# Patient Record
Sex: Male | Born: 1950
Health system: Southern US, Community
[De-identification: ages and names within clinical notes are randomized; demographics above are authoritative.]

## PROBLEM LIST (undated history)

## (undated) DIAGNOSIS — E785 Hyperlipidemia, unspecified: Secondary | ICD-10-CM

## (undated) DIAGNOSIS — G5603 Carpal tunnel syndrome, bilateral upper limbs: Secondary | ICD-10-CM

## (undated) DIAGNOSIS — E559 Vitamin D deficiency, unspecified: Secondary | ICD-10-CM

## (undated) DIAGNOSIS — I259 Chronic ischemic heart disease, unspecified: Secondary | ICD-10-CM

## (undated) DIAGNOSIS — G40909 Epilepsy, unspecified, not intractable, without status epilepticus: Secondary | ICD-10-CM

## (undated) DIAGNOSIS — M722 Plantar fascial fibromatosis: Secondary | ICD-10-CM

## (undated) HISTORY — PX: HAND SURGERY: SHX662

## (undated) HISTORY — DX: Hyperlipidemia, unspecified: E78.5

## (undated) HISTORY — DX: Carpal tunnel syndrome, bilateral upper limbs: G56.03

## (undated) HISTORY — DX: Chronic ischemic heart disease, unspecified: I25.9

## (undated) HISTORY — DX: Epilepsy, unspecified, not intractable, without status epilepticus: G40.909

## (undated) HISTORY — DX: Plantar fascial fibromatosis: M72.2

## (undated) HISTORY — PX: CHOLECYSTECTOMY: SHX55

## (undated) HISTORY — DX: Vitamin D deficiency, unspecified: E55.9

---

## 2003-06-29 ENCOUNTER — Observation Stay (HOSPITAL_COMMUNITY): Admission: EM | Admit: 2003-06-29 | Discharge: 2003-06-30 | Payer: Self-pay | Admitting: Emergency Medicine

## 2003-06-30 ENCOUNTER — Encounter (INDEPENDENT_AMBULATORY_CARE_PROVIDER_SITE_OTHER): Payer: Self-pay | Admitting: Specialist

## 2005-08-23 ENCOUNTER — Ambulatory Visit: Payer: Self-pay | Admitting: Gastroenterology

## 2005-09-04 ENCOUNTER — Ambulatory Visit: Payer: Self-pay | Admitting: Gastroenterology

## 2006-08-21 HISTORY — PX: CORONARY ARTERY BYPASS GRAFT: SHX141

## 2006-08-29 ENCOUNTER — Inpatient Hospital Stay (HOSPITAL_BASED_OUTPATIENT_CLINIC_OR_DEPARTMENT_OTHER): Admission: RE | Admit: 2006-08-29 | Discharge: 2006-08-29 | Payer: Self-pay | Admitting: Cardiology

## 2006-08-29 ENCOUNTER — Inpatient Hospital Stay (HOSPITAL_COMMUNITY): Admission: AD | Admit: 2006-08-29 | Discharge: 2006-09-02 | Payer: Self-pay | Admitting: Cardiology

## 2006-08-29 ENCOUNTER — Ambulatory Visit: Payer: Self-pay | Admitting: Thoracic Surgery (Cardiothoracic Vascular Surgery)

## 2006-09-24 ENCOUNTER — Ambulatory Visit: Payer: Self-pay | Admitting: Thoracic Surgery (Cardiothoracic Vascular Surgery)

## 2006-10-01 ENCOUNTER — Encounter (HOSPITAL_COMMUNITY): Admission: RE | Admit: 2006-10-01 | Discharge: 2006-11-26 | Payer: Self-pay | Admitting: Cardiology

## 2008-10-24 IMAGING — CR DG CHEST 2V
2 series · 2 of 2 positions shown · non-contrast
Comparison: none

CLINICAL DATA: Coronary artery disease.  Preoperative respiratory exam for coronary artery bypass grafting. 
 CHEST - 2 VIEW:

[w chest pa]
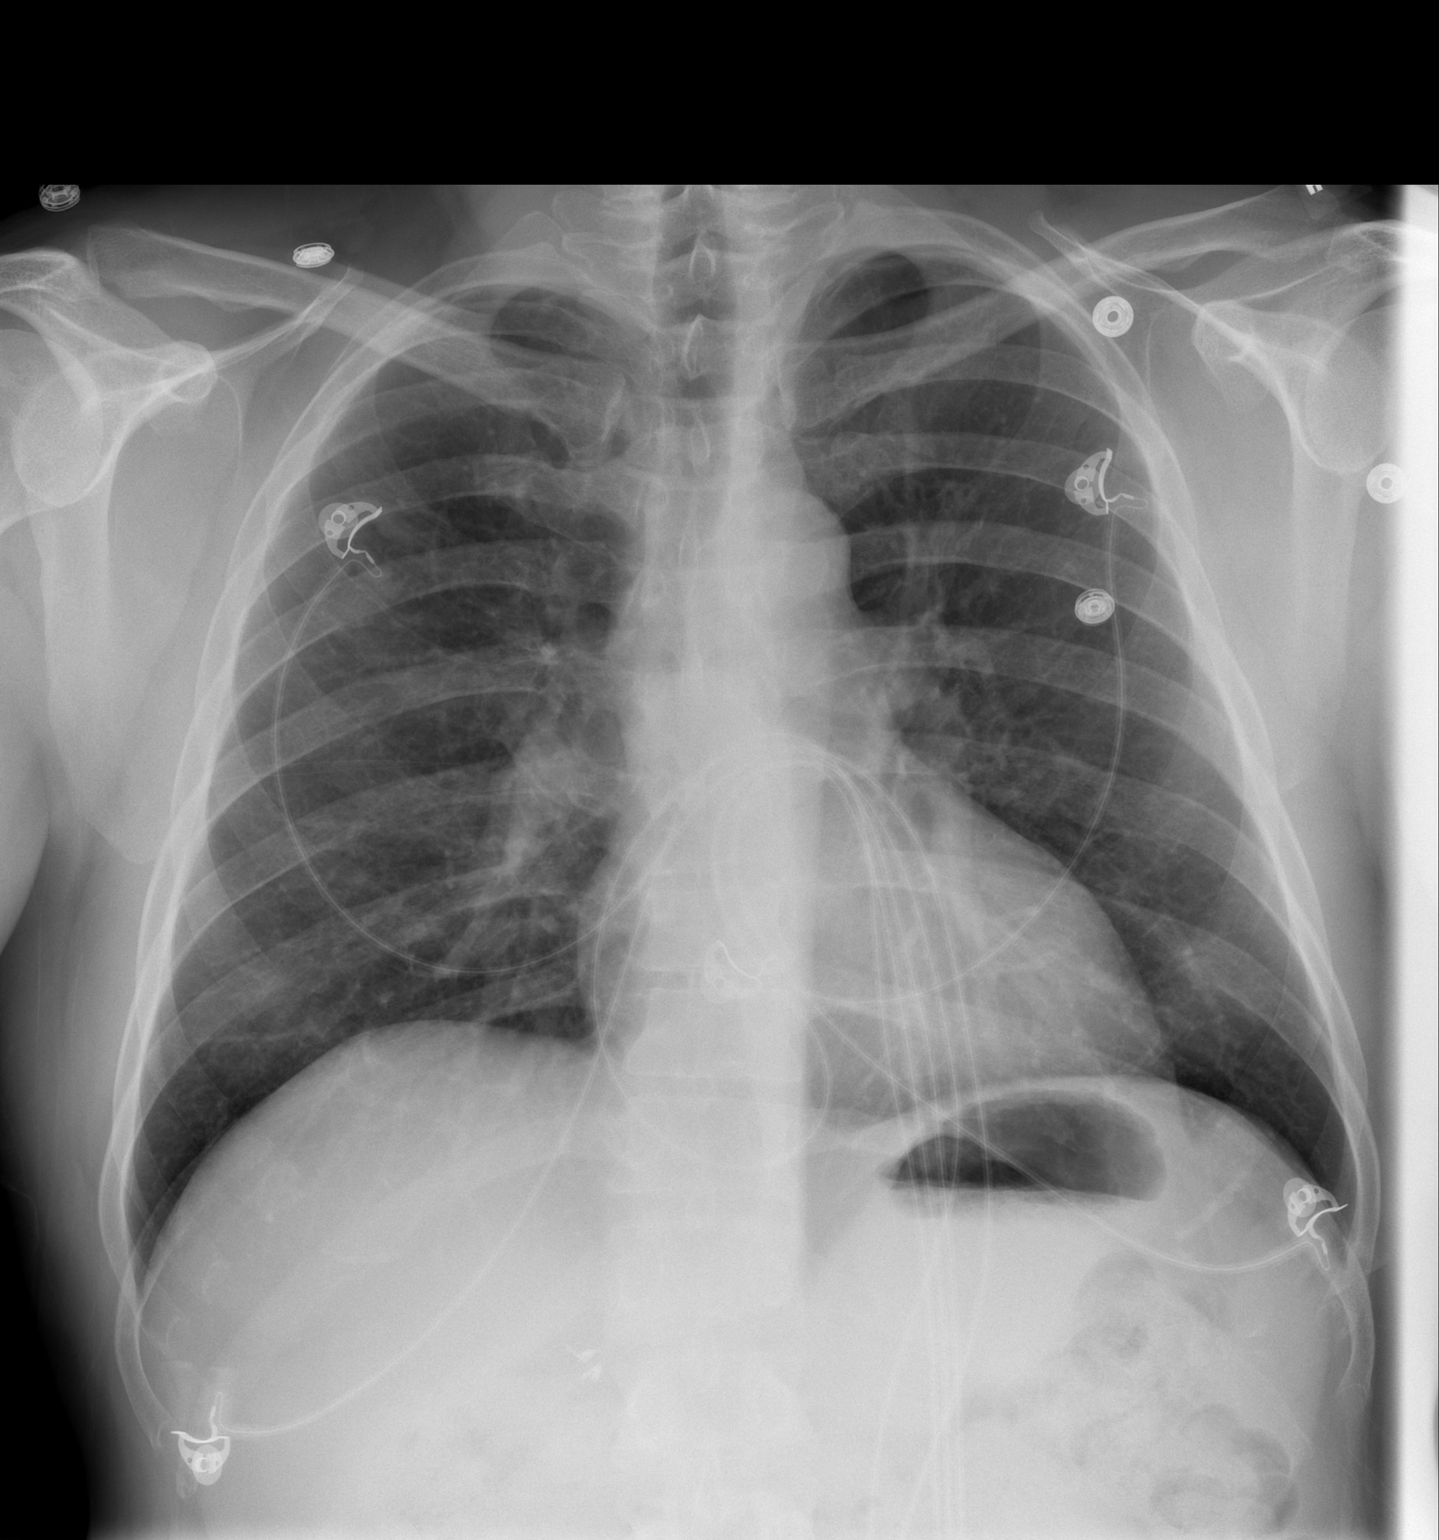

[w chest lat]
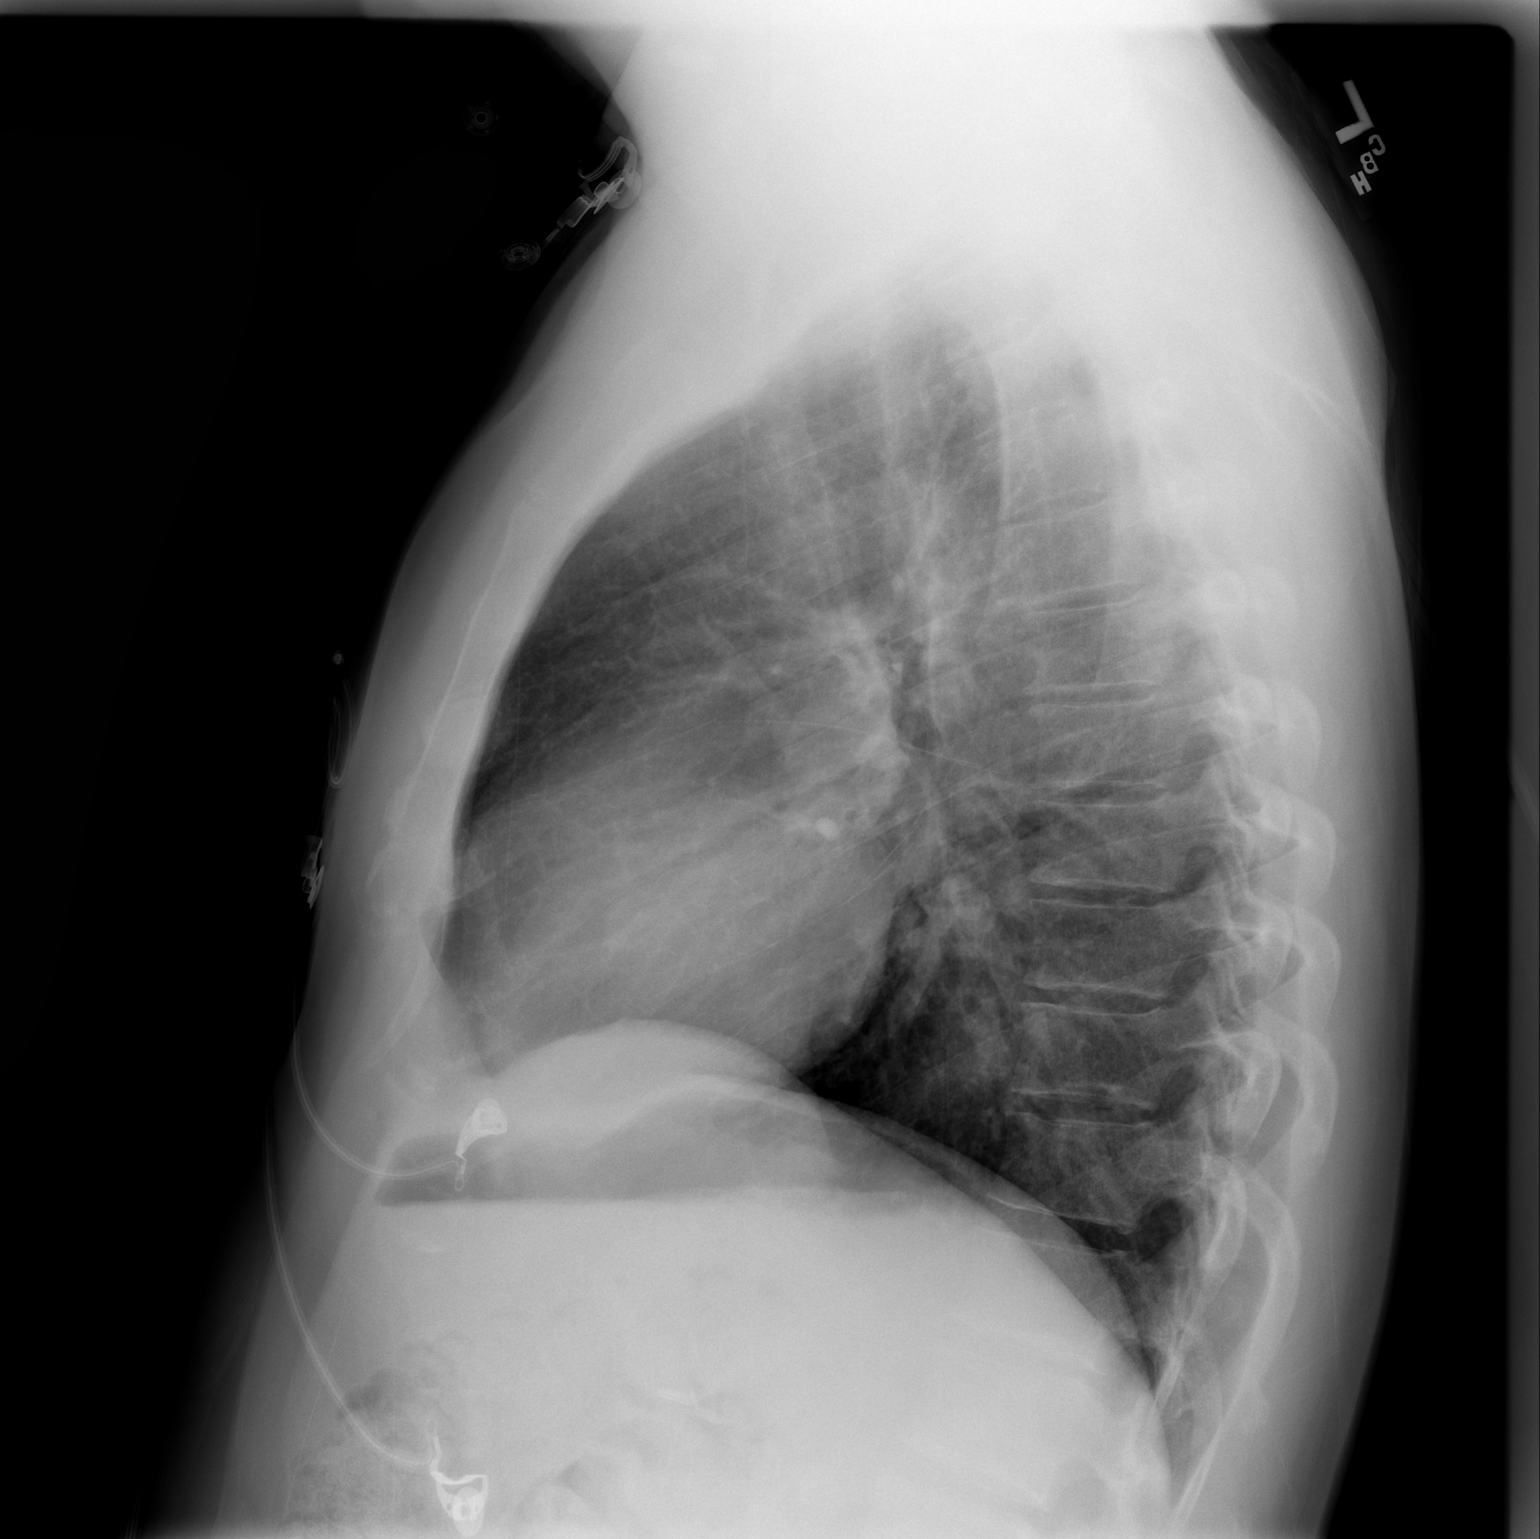

[2 of 2 positions shown; findings below may reference images not displayed]

FINDINGS: The heart size and mediastinal contours are within normal limits.  Both lungs are clear.  The visualized skeletal structures are unremarkable.
IMPRESSION: No active cardiopulmonary disease.

## 2010-04-12 ENCOUNTER — Ambulatory Visit (HOSPITAL_BASED_OUTPATIENT_CLINIC_OR_DEPARTMENT_OTHER): Admission: RE | Admit: 2010-04-12 | Discharge: 2010-04-12 | Payer: Self-pay | Admitting: Orthopedic Surgery

## 2010-08-02 LAB — BASIC METABOLIC PANEL
BUN: 11 mg/dL (ref 6–23)
Creatinine, Ser: 0.96 mg/dL (ref 0.4–1.5)
GFR calc non Af Amer: >60 mL/min (ref >60)
Glucose, Bld: 94 mg/dL (ref 70–99)

## 2010-08-02 LAB — POCT HEMOGLOBIN-HEMACUE: Hemoglobin: 15.3 g/dL (ref 13.0–17.0)

## 2010-08-31 ENCOUNTER — Ambulatory Visit (INDEPENDENT_AMBULATORY_CARE_PROVIDER_SITE_OTHER): Payer: Commercial Managed Care - PPO | Admitting: Cardiology

## 2010-08-31 ENCOUNTER — Encounter: Payer: Self-pay | Admitting: Cardiology

## 2010-08-31 DIAGNOSIS — E785 Hyperlipidemia, unspecified: Secondary | ICD-10-CM | POA: Insufficient documentation

## 2010-08-31 DIAGNOSIS — I10 Essential (primary) hypertension: Secondary | ICD-10-CM | POA: Insufficient documentation

## 2010-08-31 DIAGNOSIS — G40909 Epilepsy, unspecified, not intractable, without status epilepticus: Secondary | ICD-10-CM | POA: Insufficient documentation

## 2010-08-31 DIAGNOSIS — Z951 Presence of aortocoronary bypass graft: Secondary | ICD-10-CM

## 2010-08-31 DIAGNOSIS — Z9889 Other specified postprocedural states: Secondary | ICD-10-CM

## 2010-08-31 NOTE — Assessment & Plan Note (Signed)
followup per Dr. Oneta Rack

## 2010-08-31 NOTE — Progress Notes (Signed)
Subjective:   [name] is seen today for followup visit. He an ostial LAD lesion and had a LIMA graft to his LAD in April of 2008. He had off-pump coronary artery bypass grafting done by Dr Cornelius Moras on August 30, 2006. He has done well since that time.  He has a history of lipid disorder. He tolerated Crestor well. He is followed by Dr. Oneta Rack  Current Outpatient Prescriptions  Medication Sig Dispense Refill  . aspirin 81 MG tablet Take 81 mg by mouth daily.        . CarBAMazepine (CARBATROL PO) Take 600 mg by mouth 2 (two) times daily.        . Cholecalciferol (VITAMIN D PO) Take 10,000 Units by mouth daily.        . fish oil-omega-3 fatty acids 1000 MG capsule Take by mouth daily.        . metoprolol succinate (TOPROL-XL) 25 MG 24 hr tablet Take 25 mg by mouth daily. 12.5 MG DAILY       . rosuvastatin (CRESTOR) 40 MG tablet Take 40 mg by mouth daily.        Marland Kitchen Specialty Vitamins Products (MAGNESIUM, AMINO ACID CHELATE,) 133 MG tablet Take 1 tablet by mouth daily.          No Known Allergies  Patient Active Problem List  Diagnoses  . Plantar fasciitis  . Seizure disorder  . Hyperlipidemia    History  Smoking status  . Never Smoker   Smokeless tobacco  . Not on file    History  Alcohol Use No    Family History  Problem Relation Age of Onset  . Hypertension Mother   . Stroke Mother   . Diabetes Father     Review of Systems:   The patient denies any heat or cold intolerance.  No weight gain or weight loss.  The patient denies headaches or blurry vision.  There is no cough or sputum production.  The patient denies dizziness.  There is no hematuria or hematochezia.  The patient denies any muscle aches or arthritis.  The patient denies any rash.  The patient denies frequent falling or instability.  There is no history of depression or anxiety.  All other systems were reviewed and are negative.   Physical Exam:   Weight is 195. Blood pressure is 150/90 sitting, heart rate 58The head  is normocephalic and atraumatic.  Pupils are equally round and reactive to light.  Sclerae nonicteric.  Conjunctiva is clear.  Oropharynx is unremarkable.  There's adequate oral airway.  Neck is supple there are no masses.  Thyroid is not enlarged.  There is no lymphadenopathy.  Lungs are clear.  Chest is symmetric.  Heart shows a regular rate and rhythm.  S1 and S2 are normal.  There is no murmur click or gallop.  Abdomen is soft normal bowel sounds.  There is no organomegaly.  Genital and rectal deferred.  Extremities are without edema.  Peripheral pulses are adequate.  Neurologically intact.  Full range of motion.  The patient is not depressed.  Skin is warm and dry. Assessment / Plan:

## 2010-08-31 NOTE — Assessment & Plan Note (Signed)
Blood pressure in general has been well-controlled I'll ask him to check blood pressure readings at home. If they were remain elevated, we'll need to consider the addition of an ACE inhibitor or defer management to Dr. Oneta Rack

## 2010-08-31 NOTE — Assessment & Plan Note (Signed)
He had a followup stress echo in 2010. We will defer other followup at this point in time. He clinically is doing well he

## 2010-10-07 NOTE — H&P (Signed)
NAMENOELL, SHULAR              ACCOUNT NO.:  [id number]   MEDICAL RECORD NO.:  [id number]           PATIENT TYPE:   LOCATION:                                 FACILITY:   PHYSICIAN:  Colleen Can. Deborah Chalk, M.D.DATE OF BIRTH:  [birth year]   DATE OF ADMISSION:  DATE OF DISCHARGE:                              HISTORY & PHYSICAL   CHIEF COMPLAINT:  Chest pain.   HISTORY OF PRESENT ILLNESS:  Mr. Sian is a pleasant 60 year old white  male who has a known history of hyperlipidemia.  He presents to the  office for evaluation of chest pain over the past several days.  It  started initially while he was moving some wood that had previously been  cut.  He has had discomfort off and on since that time.  It initially  started approximately 2 weeks ago.  It has been somewhat substernal in  nature.  It has been radiating to the back.  He has had some associated  nausea as well as anxiety.  He presents to the office and he is now  referred for cardiac catheterization.   PAST MEDICAL HISTORY:  1. Hyperlipidemia.  He has been on Crestor for a couple of years.  2. History of cholecystectomy.  3. Hand surgery x2.  4. History of a seizure disorder.  He is maintained on chronic      Tegretol.   ALLERGIES:  None.   CURRENT MEDICATIONS:  1. Tegretol 600 b.i.d.  2. Crestor 10 a day.  3. Aspirin daily.  4. Centrum Silver daily.  5. Vitamin D daily.   FAMILY HISTORY:  Both of his parents are alive.  Father is 65 and has  diabetes.  Mother is 43 and has had hypertension and a stroke.   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  He has no  alcohol or tobacco.  He lives at home with his wife.  He has two  children.   REVIEW OF SYSTEMS:  Is as noted above and is otherwise unremarkable.   PHYSICAL EXAMINATION:  GENERAL:  He is a pleasant, middle-aged white  male.  He is in no acute distress.  VITAL SIGNS:  Blood pressure is elevated slightly at 150/82 sitting,  140/86 standing.   Heart rate is 58.  Respirations are 18, he is  afebrile.  Weight is 200 pounds.  SKIN:  Warm and dry.  Color is unremarkable.  LUNGS:  Clear.  HEART:  Shows a regular rhythm.  ABDOMEN:  Soft.  EXTREMITIES:  Without edema.  NEUROLOGIC:  Shows no gross focal deficits.   His EKG shows sinus bradycardia with nonspecific ST- and T-wave changes.   Other labs are pending.   OVERALL IMPRESSION:  1. Chest pain.  2. Hyperlipidemia.  3. Elevated blood pressure.   PLAN:  Will proceed on with cardiac catheterization on August 29, 2006.  The procedure risks and benefits have all been explained and he is  willing to proceed.      Sharlee Blew, N.P.      Colleen Can. Deborah Chalk, M.D.  Electronically Signed  LC/MEDQ  D:  08/28/2006  T:  08/28/2006  Job:  [id number]

## 2010-10-07 NOTE — Consult Note (Signed)
[name], [name]              ACCOUNT NO.:  [id number]   MEDICAL RECORD NO.:  [id number]          PATIENT TYPE:  INP   LOCATION:  6529                         FACILITY:  [facility]   PHYSICIAN:  Salvatore Decent. Cornelius Moras, M.D. DATE OF BIRTH:  December 02, 1950   DATE OF CONSULTATION:  08/29/2006  DATE OF DISCHARGE:                                 CONSULTATION   REQUESTING PHYSICIAN:  Colleen Can. Deborah Chalk, M.D.   PRIMARY CARE PHYSICIAN:  Lucky Cowboy, M.D.   REASON FOR CONSULTATION:  Single-vessel coronary artery disease with  critical ostial left anterior descending coronary artery stenosis and  new onset unstable angina.   HISTORY OF PRESENT ILLNESS:  [name] is a 60 year old otherwise  healthy white male with no previous cardiac history and risk factors  notable only for history of hyperlipidemia.  The patient has been in his  usual state of good health until just over a week ago when he developed  new-onset substernal chest pain during strenuous physical activity.  Over the subsequent week she had several recurring episodes of  discomfort, all occurring in the setting of physical activity or stress.  Each of these episodes lasted between 10 and 20 minutes in duration and  subsided with rest.  The pain has not been associated with shortness of  breath.  He was referred to Dr. Deborah Chalk and subsequently scheduled for  elective cardiac catheterization today.  Catheterization demonstrates a  long segment 95% critical ostial stenosis of the left anterior  descending coronary artery beginning just after the bifurcation of the  left main.  There is otherwise no significant coronary artery disease  appreciated in the remainder of the three major vessels and left  ventricular function remains preserved.  Percutaneous coronary  intervention was felt to be risky due to anatomical concerns and cardiac  surgical consultation is then requested.   REVIEW OF SYSTEMS:  GENERAL:  The patient reports normal  appetite.  He  has not been gaining or losing weight recently.  His exercise tolerance  has been normal  .  CARDIAC:  Notable for new onset symptoms of unstable  angina.  The patient denies any rest angina or nocturnal angina.  He  denies any shortness of breath either with activity or at rest.  He  denies any PND, orthopnea, or longstanding edema.  He has not had any  tachy palpitations nor syncope.  RESPIRATORY:  Negative.  The patient  denies productive cough, hemoptysis, wheezing.  GASTROINTESTINAL:  Negative.  The patient has no difficulty swallowing.  He denies  hematochezia, hematemesis, melena.  MUSCULOSKELETAL:  Negative.  The  patient denies significant arthritis or arthralgias.  NEUROLOGIC:  Negative.  The patient has history of seizure disorder in the past but  has not had any seizure activity for more than 5 years.  GENITOURINARY:  Negative.  The patient denies urinary urgency or frequency.  HEMATOLOGIC:  Negative.  HEENT:  Negative.  PSYCHIATRIC:  Negative.   PAST MEDICAL HISTORY:  1. Hyperlipidemia.  2. Seizure disorder.   PAST SURGICAL HISTORY:  Cholecystectomy.   FAMILY HISTORY:  The patient's father suffered myocardial  infarction and  had an angioplasty in his 25s.  However, his father remains alive and  well.  His mother also had coronary artery disease and remains alive.   SOCIAL HISTORY:  The patient is married and lives with his wife here in  [place].  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  He denies any tobacco or alcohol consumption.   MEDICATIONS PRIOR TO ADMISSION:  1. Crestor 10 mg daily.  2. Carbatrol 600 mg twice daily.  3. Aspirin 81 mg daily.  4. Multivitamin 1 tablet daily.  5. Vitamin B 1 tablet daily.   DRUG ALLERGIES:  None known.   PHYSICAL EXAMINATION:  GENERAL:  The patient is well-appearing male who  appears his stated age, in no acute distress.  VITAL SIGNS:  He is mildly hypertensive with blood pressure  150/82.  He  is in sinus rhythm.  HEENT:  Within normal limits.  NECK:  Supple.  There is no cervical nor supraclavicular  lymphadenopathy.  There is no jugular venous distension.  No carotid  bruits noted.  CHEST:  Auscultation of chest notable for clear breath sounds which are  symmetrical bilaterally.  No wheezes or rhonchi noted.  CARDIOVASCULAR:  Regular rate and rhythm.  No murmurs, rubs or gallops noted.  ABDOMEN:  Soft, nontender.  There are no masses.  Bowel sounds are  present.  EXTREMITIES:  Warm and well perfused.  There is no lower extremity  edema.  Distal pulses are palpable in both lower legs at the ankle.  There is no sign of venous insufficiency.  There is no lower extremity  edema.  SKIN:  Clean, dry, healthy-appearing throughout.  RECTAL/GU:  Both deferred.  NEUROLOGIC:  Grossly nonfocal and symmetrical throughout.   DIAGNOSTIC TESTS:  Cardiac catheterization performed by Dr. Deborah Chalk is  reviewed.  This demonstrates long segment, 95% proximal stenosis of the  left anterior descending coronary artery involving the ostial portion of  this vessel.  There is otherwise no significant coronary artery disease  and right dominant coronary circulation.  Left ventricular function  appears normal.  No significant wall motion abnormalities.   IMPRESSION:  Severe single vessel coronary artery disease with long  segment ostial stenosis of left anterior descending coronary artery and  new onset symptoms of unstable angina with coronary anatomy unfavorable  for percutaneous coronary intervention.  Left ventricular function  remains normal.  There is hypertension and hyperlipidemia.  I believe  [name] would best be treated with surgical revascularization to  include off-pump coronary bypass grafting.   PLAN:  I discussed options at length with [name] and his family.  Alternative treatment strategies have been discussed.  He understands and accepts all associated risks  of surgery including but not limited to  risk of death, stroke, myocardial infarction, congestive heart failure,  respiratory failure, pneumonia, bleeding requiring blood transfusion,  arrhythmia, infection, and recurrent coronary artery disease.  All their  questions have been addressed.      Salvatore Decent. Cornelius Moras, M.D.  Electronically Signed     CHO/MEDQ  D:  08/29/2006  T:  08/30/2006  Job:  [id number]   cc:   Colleen Can. Deborah Chalk, M.D.  Lucky Cowboy, M.D.

## 2010-10-07 NOTE — Op Note (Signed)
NAME:  [name], [name]                        ACCOUNT NO.:  [id number]   MEDICAL RECORD NO.:  [id number]                   PATIENT TYPE:  INP   LOCATION:  0473                                 FACILITY:  [facility]   PHYSICIAN:  Ollen Gross. Vernell Morgans, M.D.              DATE OF BIRTH:  1950-09-26   DATE OF PROCEDURE:  06/29/2003  DATE OF DISCHARGE:  06/30/2003                                 OPERATIVE REPORT   PREOPERATIVE DIAGNOSIS:  Gallstones.   POSTOPERATIVE DIAGNOSIS:  Gallstones with gangrenous cholecystitis.   PROCEDURE:  Laparoscopic cholecystectomy with intraoperative cholangiogram.   SURGEON:  Ollen Gross. Carolynne Edouard, M.D.   ASSISTANT:  Leonie Man, M.D.   ANESTHESIA:  General endotracheal.   DESCRIPTION OF PROCEDURE:  After informed consent was obtained, the patient  was brought to the operating room and placed in the supine position on the  operating room table.  After adequate induction of general anesthesia, the  patient's abdomen was prepped with Betadine and draped in the usual sterile  manner.  The area below the umbilicus was infiltrated with 0.25% Marcaine.  A small incision was made with a 15 blade knife.  This incision was carried  down through the subcutaneous tissue bluntly with a Kelly clamp and [employer] retractors until the linea alba was identified.  The linea alba was  incised with the 15 blade knife, and each side was grasped with Kocher  clamps and elevated anteriorly.  The preperitoneal space was then probed  bluntly with a hemostat until the peritoneum was opened and access was  gained to the abdominal cavity.  A 0 Vicryl pursestring stitch was placed in  the fascia around the opening.  Hasson cannula was placed through the  opening and anchored in place with the previously-placed Vicryl pursestring  stitch.  Was placed in the head up position and rolled slightly with the  right side up.  Laparoscope was placed through the Hasson cannula, and the  right upper  quadrant was inspected.  The dome of the gallbladder and liver  were readily identified.  The gallbladder had a little bit of a gangrenous  appearance to the wall.  Next, the epigastric region was infiltrated with  0.25% Marcaine.  A small incision was made with the 15 blade knife, and the  10 mm port was placed bluntly through this incision into the abdominal  cavity under direct vision.  Sites were then chosen laterally on the right  side of the abdomen for placement of 5 mm ports.  Each of these areas was  infiltrated with 0.25% Marcaine, and small stab incisions were made with the  15 blade knife.  The 5 mm ports were placed bluntly through these incisions  into the abdominal cavity under direct vision.  A blunt grasper was placed  through the lateral-most 5 mm port and used to grasp the dome of the  gallbladder and elevate it  anteriorly and superiorly.  Another blunt grasper  was placed through the other 5 mm port and used to retract on the body and  neck of the gallbladder.  A dissector was placed through the epigastric port  and using the electrocautery, the peritoneal reflection at the gallbladder  neck was opened.  Blunt dissection was then carried out in this area until  the gallbladder neck cystic duct junction was readily identified, and a good  window was created.  A single clip was placed on the gallbladder neck; a  small ductotomy was made just below the clip.  A 14 gauge Angiocath was  placed percutaneously through the anterior abdominal, and a Reddick  cholangiocatheter was placed through the Angiocath and flushed.  The Reddick  catheter was then placed within the cystic duct and anchored in place with  the clip.  A cholangiogram was obtained that showed no filling defects, good  length on the cystic duct, and rapid emptying into the duodenum.  The  anchoring clip and catheters were then removed from the patient.  Three  clips were placed proximally on the cystic duct, and  the duct was divided  between the two sets of clips.  Posterior to this, the cystic artery was  identified and again dissected bluntly in a circumferential manner until a  good window was created.  Three clips were placed proximally and one  distally on the artery, and the artery was divided between the two.  Next, a  laparoscopic hook cautery device was used to separate the gallbladder from  the liver bed.  Prior to completely detaching the gallbladder from the liver  bed, the liver bed was inspected, and several small bleeding points were  coagulated with the electrocautery.  The gallbladder was then detached the  rest of the way without difficulty from the liver bed.  An endoscopic bag  was placed through the epigastric port.  The gallbladder was placed within  the bag, and the bag was sealed.  The laparoscope was then moved to the  epigastric port; a gallbladder grasper was placed through the Hasson cannula  and used to grasp the opening in the bag.  The bag was removed with the  Hasson cannula through the infraumbilical port without difficulty.  The  fascial defect was closed with the previously-placed Vicryl pursestring  stitch as well as with another interrupted 0 Vicryl stitch.  The abdomen was  then irrigated with copious amounts of saline until the effluent was clear.  The liver bed was inspected again and found to be hemostatic.  The ports  were then all removed from the patient, and all were found to be hemostatic.  The gas was allowed to escape.  The skin incisions were all closed with  interrupted 4-0 Monocryl subcuticular stitches.  Benzoin and Steri-Strips  were applied.  The patient tolerated the procedure well.  At the end of the  case, all needle, sponge, and instrument counts were correct.  The patient  was then awakened and taken to the recovery room in stable condition.                                               Ollen Gross. Vernell Morgans, M.D.   PST/MEDQ  D:  07/01/2003   T:  07/01/2003  Job:  [id number]

## 2010-10-07 NOTE — Discharge Summary (Signed)
NAMEBRADYN, [name]              ACCOUNT NO.:  [id number]   MEDICAL RECORD NO.:  [id number]          PATIENT TYPE:  INP   LOCATION:  2009                         FACILITY:  [facility]   PHYSICIAN:  Salvatore Decent. Cornelius Moras, M.D. DATE OF BIRTH:  Oct 31, 1950   DATE OF ADMISSION:  08/29/2006  DATE OF DISCHARGE:                               DISCHARGE SUMMARY   ADDENDUM   PROCEDURE:  On August 30, 2006, he underwent median sternotomy for off  pump coronary artery bypass grafting x1 to the left internal mammary  artery to the distal left anterior descending coronary artery by Dr.  Tressie Stalker.      [name], P.A.      Salvatore Decent. Cornelius Moras, M.D.  Electronically Signed    AWZ/MEDQ  D:  09/01/2006  T:  09/01/2006  Job:  [id number]   cc:   Salvatore Decent. Cornelius Moras, M.D.  [name], M.D.  [name], M.D.  [name], M.D.

## 2010-10-07 NOTE — Op Note (Signed)
[name], [name]              ACCOUNT NO.:  [id number]   MEDICAL RECORD NO.:  [id number]          PATIENT TYPE:  INP   LOCATION:  2315                         FACILITY:  [facility]   PHYSICIAN:  Salvatore Decent. Cornelius Moras, M.D. DATE OF BIRTH:  [birth year]   DATE OF PROCEDURE:  08/30/2006  DATE OF DISCHARGE:                               OPERATIVE REPORT   PREOPERATIVE DIAGNOSIS:  Single-vessel coronary artery disease with  critical ostial stenosis of the left anterior descending coronary  artery.   POSTOPERATIVE DIAGNOSIS:  Single-vessel coronary artery disease with  critical ostial stenosis of the left anterior descending coronary  artery.   PROCEDURE:  Median sternotomy for off-pump coronary artery bypass  grafting x1 (left internal mammary artery to distal left anterior  descending coronary artery).   SURGEON:  Dr. Salvatore Decent. Cornelius Moras   ASSISTANT:  Ms. Zadie Rhine.   ANESTHESIA:  General.   BRIEF CLINICAL NOTE:  The patient is a 60 year old gentleman who  presents with new-onset symptoms of angina pectoris.  Cardiac  catheterization performed by Dr. Delfin Edis demonstrates a long  segment 95% stenosis of the ostial portion of the left anterior  descending coronary artery.  There is normal left ventricular function.  No other significant atherosclerotic coronary artery disease is  appreciated.  A full consultation has been dictated previously.  The  patient has been counseled regarding the indications, risks, and  potential benefits of surgery.  Alternative treatment strategies have  been discussed.  He understands and accepts all associated risks of  surgery and desires to proceed as described.   OPERATIVE NOTE IN DETAIL:  The patient is brought to the operating room  on the above-mentioned date and central monitoring was established by  the anesthesia service under the care and direction of Dr. Hart Robinsons.  Specifically, a Swan-Ganz catheter was placed through the  right internal jugular approach.  A radial arterial line is placed.  Intravenous antibiotics were administered.  Following induction with  general endotracheal anesthesia, a Foley catheter is placed.  The  patient's chest, abdomen, both groins, and both lower extremities were  prepared, draped in sterile manner.  Baseline transesophageal  echocardiogram was performed by Dr. Gelene Mink.  This demonstrates normal  left ventricular function.  No other significant abnormalities are  noted.   A median sternotomy incision is performed and the left internal mammary  artery is dissected from the chest wall and prepared for bypass  grafting.  The left internal mammary artery is good-quality conduit.  Following systemic heparinization the left internal mammary artery is  transected distally.  It is noted to have excellent flow.   The pericardium was opened.  The surface of the heart was inspected and  is notably normal in appearance.  The patient is placed in Trendelenburg  position with the table rotated towards the surgeon's side.  The Guidant  acrobat cardiac stabilization system is utilized to facilitate off-pump  coronary artery bypass surgery.  The apical suction cup and the  horseshoe stabilizing device were both utilized.  Elastic vessel loops  were used for proximal and distal left vascular  control.  Intracoronary  shunt was not utilized.  Single vessel off-pump coronary artery bypass  grafting was performed constructing left internal mammary artery to the  distal left anterior descending coronary artery in end-to-side fashion.  The left anterior descending coronary artery is good-quality target  vessel for grafting.  It measures 2 mm in diameter at the site of distal  grafting.  The procedure is completed uneventfully.  There is no  hemodynamic instability whatsoever.  Following completion of the distal  anastomoses protamine was administered to reverse the anticoagulation.   The  mediastinum was irrigated with saline solution containing  vancomycin.  Meticulous surgical hemostasis ascertained.  Epicardial  pacing wires were fixed to the right atrial appendage.  The mediastinum  and the left pleural space are drained with three chest tubes exited  through separate stab incisions inferiorly.  The pericardium and soft  tissues anterior to the aorta are reapproximated loosely.  The sternum  was closed with double-strength sternal wire.  The On-Q continuous pain  management system is utilized to facilitate postoperative pain control.  Two 10 inches catheters supplied with the On-Q kit are tunneled into the  deep subcutaneous tissues and positioned just lateral to the lateral  border of the sternum on either side.  Each catheter is flushed with 5  mL of 0.5% bupivacaine solution and ultimately connected to continuous  infusion pump.  Soft tissues anterior to sternum are closed in multiple  layers and the skin is closed with a running subcuticular skin closure.   The patient tolerated the procedure well and was transported to the  surgical intensive care unit in stable condition.  There are no  intraoperative complications.  All sponge, instrument and needle counts  verified correct at completion of the procedure.  No blood products were  administered.      Salvatore Decent. Cornelius Moras, M.D.  Electronically Signed     CHO/MEDQ  D:  08/30/2006  T:  08/30/2006  Job:  [id number]   cc:   Colleen Can. Deborah Chalk, M.D.  Lucky Cowboy, M.D.

## 2010-10-07 NOTE — Discharge Summary (Signed)
NAMEMARQUARIUS, [name]              ACCOUNT NO.:  [id number]   MEDICAL RECORD NO.:  [id number]          PATIENT TYPE:  INP   LOCATION:  2009                         FACILITY:  [facility]   PHYSICIAN:  Salvatore Decent. Cornelius Moras, M.D. DATE OF BIRTH:  24-Feb-1951   DATE OF ADMISSION:  08/29/2006  DATE OF DISCHARGE:  09/02/2006                               DISCHARGE SUMMARY   ADMISSION DIAGNOSES:  1. Chest pain.  2. Hyperlipidemia.  3. Hypertension.   DISCHARGE/SECONDARY DIAGNOSES:  1. Single-vessel coronary artery disease status post coronary artery      bypass graft.  2. Hypertension.  3. Hyperlipidemia.  4. History of cholecystectomy.  5. History of seizure disorder maintained on Carbatrol.  6. History of hand surgery x2.  7. No history of tobacco use.   ALLERGIES:  No known drug allergies.   BRIEF HISTORY:  [name] is a 60 year old Caucasian male with known  history of hyperlipidemia.  He was seen by Dr. Deborah Chalk approximately one  week prior to admission for evaluation of chest pains lasting over the  past several days.  Initially, it started when he was making some wood.  His chest discomfort has been on and off since then.  It is somewhat  substernal in nature, and it started approximately two weeks ago.  It  also is radiating to his back and is associated with some nausea and  anxiety as well.   EKG shows sinus bradycardia with nonspecific ST and T wave changes.  Dr.  Deborah Chalk recommended that he should undergo cardiac catheterization.   HOSPITAL COURSE:  [name] was admitted to [facility] on  August 29, 2006, for cardiac catheterization.  Significant findings showed  a long segment of 95% critical ostial stenosis of the left anterior  descending coronary artery beginning just after the bifurcation of the  left main.  There is otherwise no significant coronary artery disease  appreciated in the remainder of his three major vessels, and left  ventricular function was well  preserved.  Percutaneous coronary  intervention was felt to be too risky due to anatomical concerns, and  cardiac surgical consultation was requested, and he was evaluated by Dr.  Tressie Stalker who recommended artery bypass grafting x1, off pump.   For surgery, he was on IV heparin.  Ultimately, he was taken to the  operating room on August 30, 2006, for the prementioned procedures.   The patient's postoperative course has been uneventful.  He was  extubated and neurologically intact.  He was transferred out of the  Surgical Intensive Care Unit after postoperative day 1.  He has remained  in normal sinus rhythm.  Blood pressure has been most recently 119/72.  His chest tubes and his monitoring lines have been discontinued.  Chest  x-ray showed improvement in aeration without pneumothorax.  He has been  afebrile, maintained in sinus rhythm, and oxygen saturation is 93% on  room air.  Blood sugars were monitored per cardiac surgery protocol, and  they have been within normal limits.   PHYSICAL EXAMINATION:  HEART:  Regular rate and rhythm.  LUNGS:  Clear.  ABDOMEN:  Benign.  EXTREMITIES:  Without edema.   His incision is without signs of infection.  He has been ambulating  without difficulty independently and tolerating a regular diet.  Monitoring devices were inserted intraoperatively with plans to be  discontinued on postoperative day three; otherwise, has been using oral  pain medication.  He has also required Ambien postoperatively as needed  for insomnia.   His recent labs show a white blood cell count of 7.1, hemoglobin 12.7,  hematocrit 37.5, and platelet count of 168.  Sodium 139, potassium 2.8,  chloride 106, CO2 28, BUN 11, creatinine 2.96, blood glucose 120.  Admission labs show an AST of 20, ALT 19, alkaline phosphatase 60, total  bilirubin 0.7, albumin 3.5.  TSH is 2.805.  His hemoglobin is A1c of  5.7.   [name] continues to make great progress with no significant  change  in his status.  It is anticipated he will be ready for discharge home on  postoperative day three, September 02, 2006.   DISCHARGE MEDICATIONS:  1. Aspirin 325 mg p.o. daily.  2. Toprol XL 25 mg p.o. daily.  3. Crestor 20 mg 1/2 tablet p.o. q.p.m.  4. Carbatrol ER 300 mg p.o. b.i.d.  5. Multivitamin p.o. daily.  6. Ambien 5 mg p.o. q.h.s. p.r.n. for sleep.  7. Oxycodone 5 mg 1 to 2 tablets p.o. q.4h. p.r.n. pain.   DISCHARGE INSTRUCTIONS:  He is to avoid driving, heavy lifting more than  ten pounds.  He is to continue daily walking and breathing exercises.  He is to follow a low-fat, low-salt diet.  He may shower and clean his  incision with soap and water.  He is to notify the office if he develops  temperature of 101 or redness or drainage from his incision site.   FOLLOWUP:  He is to follow up with Dr. Cornelius Moras at the [facility] in approximately three weeks.  He is to also call and  schedule a two-week followup with Dr. Deborah Chalk to have a postoperative  chest x-ray at that appointment and bring his x-ray with him to  appointment with Dr. Cornelius Moras.      Jerold Coombe, P.A.      Salvatore Decent. Cornelius Moras, M.D.  Electronically Signed    AWZ/MEDQ  D:  09/01/2006  T:  09/01/2006  Job:  [id number]   cc:   Salvatore Decent. Cornelius Moras, M.D.  Lucky Cowboy, M.D.  Catherine A. Orlin Hilding, M.D.

## 2010-10-07 NOTE — H&P (Signed)
NAME:  [name], [name]                        ACCOUNT NO.:  [id number]   MEDICAL RECORD NO.:  [id number]                   PATIENT TYPE:  EMS   LOCATION:  ED                                   FACILITY:  [facility]   PHYSICIAN:  Ollen Gross. Vernell Morgans, M.D.              DATE OF BIRTH:  [birth year]   DATE OF ADMISSION:  06/29/2003  DATE OF DISCHARGE:                                HISTORY & PHYSICAL   HISTORY OF PRESENT ILLNESS:  [name] is a 60 year old white male who  states that he woke up about 3 a.m. this morning from sleep with severe  epigastric and right upper quadrant pain.  The pain initially did radiate  some to his back.  The pain was associated with some nausea but no real  vomiting.  The pain never really let up and he came to the emergency  department for further evaluation.  Since being in the emergency department  he has had some Demerol and Phenergan but still has not had much relief of  his pain.  He has not run any fevers.  He denies any chest pain, shortness  of breath, diarrhea, or dysuria.  The rest of his review of systems is  unremarkable.   PAST MEDICAL HISTORY:  Significant for hypercholesterolemia and temporal  lobe seizures.   PAST SURGICAL HISTORY:  Significant for hand surgery.   MEDICATIONS:  Lipitor and Carbatrol.   ALLERGIES:  No known drug allergies.   SOCIAL HISTORY:  He denies the use of alcohol or tobacco products.   FAMILY HISTORY:  Noncontributory.   PHYSICAL EXAMINATION:  VITAL SIGNS:  His temperature is 98.4, blood pressure  154/90, pulse of 55.  GENERAL:  He is well-developed, well-nourished white male in no acute  distress.  SKIN:  Warm and dry with no jaundice.  HEENT:  Eyes:  Extraocular muscles are intact.  Pupils equal, round, and  react to light.  Sclerae nonicteric.  LUNGS:  Clear bilaterally with no use of accessory respiratory muscles.  HEART:  Regular rate and rhythm with an impulse in the left chest.  ABDOMEN:  Soft with some  moderate to severe epigastric pain but no evidence  of peritonitis, no palpable mass or hepatosplenomegaly.  EXTREMITIES:  No cyanosis, clubbing, or edema.  PSYCHOLOGIC:  He is alert and oriented x3 with no state of anxiety or  depression.   LABORATORY DATA:  He had an ultrasound in the emergency department today  that showed a stone impacted in the gallbladder neck but no thickening of  the gallbladder wall or pericholecystic fluid or dilation of the ducts.   On review of his lab work his white count was 7600; hemoglobin 14.7;  hematocrit 44.2; platelet count 185,000.  His sodium is 139, potassium 3.9,  chloride 107, CO2 29, BUN 13, creatinine 1.1, glucose 126, SGOT 31, SGPT 24,  alk phos 67, total bili 0.3, amylase was 95.  ASSESSMENT AND PLAN:  This is a 60 year old white male with a symptomatic  stone impacted at the gallbladder neck.  He has not had any relief with pain  medicine in the IV and I think he will require cholecystectomy  today.  He is a good candidate for a laparoscopic cholecystectomy.  I have  explained to him and his family in detail the risks and benefits of the  operation as well as some of the technical aspects and they understand and  wish to proceed.  We will plan to do this for him this afternoon.                                               Ollen Gross. Vernell Morgans, M.D.    PST/MEDQ  D:  06/29/2003  T:  06/29/2003  Job:  [id number]

## 2010-10-07 NOTE — Cardiovascular Report (Signed)
NAMEJERRON, [name]              ACCOUNT NO.:  [id number]   MEDICAL RECORD NO.:  [id number]          PATIENT TYPE:  OIB   LOCATION:  1965                         FACILITY:  [facility]   PHYSICIAN:  Colleen Can. Deborah Chalk, M.D.DATE OF BIRTH:  01-Dec-1950   DATE OF PROCEDURE:  08/29/2006  DATE OF DISCHARGE:                            CARDIAC CATHETERIZATION   PROCEDURES:  Left heart catheterization with selective coronary  angiography and left ventricular angiography.   TYPE AND SITE OF ENTRY:  Percutaneous right femoral artery through  curved catheters, 4-French #4 curved Judkins right and left coronary  catheters, 4-French pigtail ventriculographic catheter.   CONTRAST:  Mature Omnipaque.   MEDICATIONS GIVEN PRIOR TO PROCEDURE:  Valium 10 mg p.o.   MEDICATIONS GIVEN DURING THE PROCEDURE:  Versed 1 mg IV.   COMMENTS:  The patient tolerated the procedure well.   HEMODYNAMIC DATA:  The aortic pressure is 139/74, LV is 145/4-12.  There  was no aortic valve gradient noted on pullback.   ANGIOGRAPHIC DATA:  Left ventricular angiogram:  She has very minimal  anterior hypokinesia.  The global ejection fraction was 55-60%.   Coronary arteries:  1. Left main coronary artery is normal.  2. Left anterior descending had a 95% ostial stenosis of approximately      10 mm in segmental length.  The left anterior descending extended      to the apex.  It is relatively free of disease otherwise.  3. Left circumflex:  The left circumflex was of moderate size.  An      obtuse marginal and a continuation branch in the AV groove.  It was      essentially normal.  4. Right coronary artery:  The right coronary artery was a relatively      large, dominant vessel.  It had a large posterior descending vessel      and posterior lateral branches.  It extended up to the apex.  It      was normal.   OVERALL IMPRESSION:  Severe ostial left anterior descending artery  stenosis with very minimal anterior  hypokinesis and overall normal left  ventricular function globally.   PLAN:  I do not think this presents the opportunity for a percutaneous  intervention.  We will referred for surgical placement of the left  internal mammary artery graft.      Colleen Can. Deborah Chalk, M.D.  Electronically Signed     SNT/MEDQ  D:  08/29/2006  T:  08/29/2006  Job:  [id number]

## 2011-09-07 ENCOUNTER — Encounter: Payer: Self-pay | Admitting: Cardiology

## 2011-09-21 ENCOUNTER — Encounter: Payer: Self-pay | Admitting: Cardiology

## 2011-09-29 ENCOUNTER — Ambulatory Visit (INDEPENDENT_AMBULATORY_CARE_PROVIDER_SITE_OTHER): Payer: Commercial Managed Care - PPO | Admitting: Cardiology

## 2011-09-29 ENCOUNTER — Encounter: Payer: Self-pay | Admitting: Cardiology

## 2011-09-29 VITALS — BP 150/84 | HR 61 | Ht 68.0 in | Wt 201.1 lb

## 2011-09-29 DIAGNOSIS — I25810 Atherosclerosis of coronary artery bypass graft(s) without angina pectoris: Secondary | ICD-10-CM

## 2011-09-29 DIAGNOSIS — I10 Essential (primary) hypertension: Secondary | ICD-10-CM

## 2011-09-29 DIAGNOSIS — E785 Hyperlipidemia, unspecified: Secondary | ICD-10-CM

## 2011-09-29 DIAGNOSIS — I2510 Atherosclerotic heart disease of native coronary artery without angina pectoris: Secondary | ICD-10-CM

## 2011-09-29 NOTE — Patient Instructions (Addendum)
Your physician has requested that you have en exercise stress myoview. For further information please visit [email or web address] Please follow instruction sheet, as given.  Take and record your blood pressure about the same time every day. I will call you in 2 weeks to get the readings. Luana Shu  Your physician wants you to follow-up in: 1 year with Dr Shirlee Latch. (May 2014). You will receive a reminder letter in the mail two months in advance. If you don't receive a letter, please call our office to schedule the follow-up appointment.

## 2011-10-01 DIAGNOSIS — I2510 Atherosclerotic heart disease of native coronary artery without angina pectoris: Secondary | ICD-10-CM | POA: Insufficient documentation

## 2011-10-01 NOTE — Assessment & Plan Note (Signed)
LDL near goal when recently checked.

## 2011-10-01 NOTE — Assessment & Plan Note (Signed)
Status post CABG with LIMA-LAD in 4/08.  He has been having episodes of atypical chest pain.  The pain reminds him of his prior ischemic chest pain.  I will arrange for ETT-myoview.  He will continue ASA 81, Toprol XL, and Crestor.

## 2011-10-01 NOTE — Assessment & Plan Note (Signed)
He will check BP at home over the next couple of weeks and we will call for readings at that time.

## 2011-10-01 NOTE — Progress Notes (Signed)
PCP: Dr. Oneta Rack  61 yo with history of CAD s/p single vessel LIMA-LAD in 4/08 presents for cardiology followup.  He has been seen by Dr. Deborah Chalk in the past and is seen by me for the first time today.   He has been having occasional episodes of central substernal chest pain that reminds him of the pain prior to CABG.  These episodes have no particular trigger and are not exertional.  They are relatively mild and last about 5 minutes at a time.  He will get several a week.   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  No dyspnea when walking.   BP is elevated today but runs in the 130s systolic when he checks at home.   Labs (4/13): K 4.4, creatinine 0.94, LDL 74, HDL 41  PMH: 1. Hyperlipidemia 2. Seizure disorder 3. CAD: s/p CABG in 4/08 with off-pump LIMA-LAD.  Stress echo in 2010 was normal.   FH: Father with PTCA in his 79s.   SH: Nonsmoker, runs optical lab for ophthalmologist, married.   ROS: All systems reviewed and negative except as per HPI.   Current Outpatient Prescriptions  Medication Sig Dispense Refill  . aspirin 81 MG tablet Take 81 mg by mouth daily.        . CarBAMazepine (CARBATROL PO) Take 600 mg by mouth 2 (two) times daily.        . Cholecalciferol (VITAMIN D PO) Take 10,000 Units by mouth daily.        . fish oil-omega-3 fatty acids 1000 MG capsule Take by mouth daily.        . metoprolol succinate (TOPROL-XL) 25 MG 24 hr tablet Take by mouth daily.       . rosuvastatin (CRESTOR) 40 MG tablet Take 40 mg by mouth daily.        Marland Kitchen Specialty Vitamins Products (MAGNESIUM, AMINO ACID CHELATE,) 133 MG tablet Take 1 tablet by mouth daily.          BP 150/84  Pulse 61  Ht 5\' 8"  (1.727 m)  Wt 201 lb 1.9 oz (91.227 kg)  BMI 30.58 kg/m2  SpO2 97% General: NAD Neck: No JVD, no thyromegaly or thyroid nodule.  Lungs: Clear to auscultation bilaterally with normal respiratory effort. CV: Nondisplaced PMI.  Heart regular S1/S2, no S3/S4, no murmur.  No  peripheral edema.  No carotid bruit.  Normal pedal pulses.  Abdomen: Soft, nontender, no hepatosplenomegaly, no distention.  Neurologic: Alert and oriented x 3.  Psych: Normal affect. Extremities: No clubbing or cyanosis.

## 2011-10-09 ENCOUNTER — Encounter: Payer: Self-pay | Admitting: Cardiovascular Disease

## 2011-10-12 ENCOUNTER — Ambulatory Visit (HOSPITAL_COMMUNITY): Payer: 59 | Attending: Cardiology | Admitting: Radiology

## 2011-10-12 VITALS — BP 127/76 | Ht 68.0 in | Wt 200.0 lb

## 2011-10-12 DIAGNOSIS — I2510 Atherosclerotic heart disease of native coronary artery without angina pectoris: Secondary | ICD-10-CM

## 2011-10-12 DIAGNOSIS — R079 Chest pain, unspecified: Secondary | ICD-10-CM

## 2011-10-12 DIAGNOSIS — I25810 Atherosclerosis of coronary artery bypass graft(s) without angina pectoris: Secondary | ICD-10-CM | POA: Insufficient documentation

## 2011-10-12 MED ORDER — TECHNETIUM TC 99M TETROFOSMIN IV KIT
33.0000 | PACK | Freq: Once | INTRAVENOUS | Status: AC | PRN
  Administered 2011-10-12: 33 via INTRAVENOUS

## 2011-10-12 MED ORDER — TECHNETIUM TC 99M TETROFOSMIN IV KIT
11.0000 | PACK | Freq: Once | INTRAVENOUS | Status: AC | PRN
  Administered 2011-10-12: 11 via INTRAVENOUS

## 2011-10-12 NOTE — Progress Notes (Addendum)
[facility] SITE 3 NUCLEAR MED [address] [place] [address] [phone]  Cardiology Nuclear Med Study  [name] is a 61 y.o. male     MRN : [id number]     DOB: [birth year]  Procedure Date: 10/12/2011  Nuclear Med Background Indication for Stress Test:  Evaluation for Ischemia and Graft Patency History:  '08 ECHO: NL EF: 50% mild hypokinesis ofanterolateral septum, Heart Cath: 95% LAD -CABG CFX and RCA NL Cardiac Risk Factors: Family History - CAD, Hypertension and Lipids  Symptoms:  Chest Pain   Nuclear Pre-Procedure Caffeine/Decaff Intake:  None NPO After: 12:00am   Lungs:  clear O2 Sat: 98% on room air. IV 0.9% NS with Angio Cath:  20g  IV Site: R Antecubital  IV Started by:  Stanton Kidney, EMT-P  Chest Size (in):  42 Cup Size: n/a  Height: 5\' 8"  (1.727 m)  Weight:  200 lb (90.719 kg)  BMI:  Body mass index is 30.41 kg/(m^2). Tech Comments:  Toprol held > 24 hours, per patient.    Nuclear Med Study 1 or 2 day study: 1 day  Stress Test Type:  Stress  Reading MD: Willa Rough, MD  Order Authorizing Provider:  D.McLean  Resting Radionuclide: Technetium 40m Tetrofosmin  Resting Radionuclide Dose: 10.9 mCi   Stress Radionuclide:  Technetium 32m Tetrofosmin  Stress Radionuclide Dose: 33.0 mCi           Stress Protocol Rest HR: 51 Stress HR: 151  Rest BP: 127/76 Stress BP: 207/68  Exercise Time (min): 11:00 METS: 13.4   Predicted Max HR: 157 bpm % Max HR: 96.18 bpm Rate Pressure Product: 21308   Dose of Adenosine (mg):  n/a Dose of Lexiscan: n/a mg  Dose of Atropine (mg): n/a Dose of Dobutamine: n/a mcg/kg/min (at max HR)  Stress Test Technologist: Milana Na, EMT-P  Nuclear Technologist:  Domenic Polite, CNMT     Rest Procedure:  Myocardial perfusion imaging was performed at rest 45 minutes following the intravenous administration of Technetium 58m Tetrofosmin. Rest ECG: Sinus Bradycardia  Stress Procedure:  The patient performed  treadmill exercise using a Bruce  Protocol for 11:00 minutes. The patient stopped due to fatigue and denied any chest pain.  There were non specific ST-T wave changes.  Technetium 70m Tetrofosmin was injected at peak exercise and myocardial perfusion imaging was performed after a brief delay. Stress ECG: No significant change from baseline ECG  QPS Raw Data Images:  Normal; no motion artifact; normal heart/lung ratio. Stress Images:  Normal homogeneous uptake in all areas of the myocardium. Rest Images:  Normal homogeneous uptake in all areas of the myocardium. Subtraction (SDS):  No evidence of ischemia. Transient Ischemic Dilatation (Normal <1.22):  1.09 Lung/Heart Ratio (Normal <0.45):  0.32  Quantitative Gated Spect Images QGS EDV:  125 ml QGS ESV:  55 ml  Impression Exercise Capacity:  Good exercise capacity. BP Response:  Normal blood pressure response. Clinical Symptoms:  No symptoms. ECG Impression:  No significant ST segment change suggestive of ischemia. Comparison with Prior Nuclear Study: No previous nuclear study performed  Overall Impression:  Normal stress nuclear study.  LV Ejection Fraction: 56%.  LV Wall Motion:  Normal Wall Motion  Willa Rough, MD  Normal study, please inform patient.   Marca Ancona 10/13/2011 10:54 AM

## 2011-10-17 ENCOUNTER — Telehealth: Payer: Self-pay | Admitting: *Deleted

## 2011-10-17 NOTE — Progress Notes (Signed)
Pt.notified

## 2011-10-17 NOTE — Progress Notes (Signed)
LMTCB

## 2011-10-17 NOTE — Telephone Encounter (Signed)
Hypertension - Marca Ancona, MD 10/01/2011 9:54 PM Signed  He will check BP at home over the next couple of weeks and we will call for readings at that time.   10/17/11--Spoke with pt about BP readings. Pt states he has not been checking his BP regularly. He has been cutting back on caffeine ang thinks this helps.  His BP at time of myoview 10/12/11 was 127/76. He will take and record his BP regularly for the next couple of weeks. He will call me back in about 2 weeks with the readings.

## 2012-09-24 ENCOUNTER — Encounter: Payer: Self-pay | Admitting: Neurology

## 2012-09-25 ENCOUNTER — Encounter: Payer: Self-pay | Admitting: Cardiology

## 2012-09-25 ENCOUNTER — Encounter: Payer: Self-pay | Admitting: Neurology

## 2012-09-25 ENCOUNTER — Ambulatory Visit (INDEPENDENT_AMBULATORY_CARE_PROVIDER_SITE_OTHER): Payer: 59 | Admitting: Neurology

## 2012-09-25 VITALS — BP 113/66 | HR 59 | Ht 67.0 in | Wt 201.0 lb

## 2012-09-25 DIAGNOSIS — G40909 Epilepsy, unspecified, not intractable, without status epilepticus: Secondary | ICD-10-CM

## 2012-09-25 MED ORDER — CARBAMAZEPINE ER 300 MG PO CP12: 300.0000 mg | ORAL_CAPSULE | Freq: Two times a day (BID) | ORAL | Status: DC

## 2012-09-25 NOTE — Progress Notes (Signed)
Reason for visit: Seizures  [name] is an 62 y.o. male  History of present illness:  [name] is a 62 year old right-handed white male with a history of seizures that are under good control. The patient is on Carbatrol taking 300 mg twice daily. The patient is tolerating the medication quite well, with the exception that on occasion, he will have a brief episodes of oscillopsia when he refocuses his eyes from one object to another. The patient has not had a seizure in about 9 years. The patient is operating a motor vehicle, and he is doing well with this. The patient does not report any significant drowsiness or gait instability on Carbatrol. The patient indicates that his primary care physician has just recently checked blood work that included a carbamazepine level. The patient returns for an evaluation.  Past Medical History  Diagnosis Date  . Plantar fasciitis   . Ischemic heart disease   . Seizure disorder   . Hyperlipidemia   . Dyslipidemia   . Bilateral carpal tunnel syndrome     Past Surgical History  Procedure Laterality Date  . Coronary artery bypass graft  08/2006  . Cholecystectomy    . Hand surgery      Family History  Problem Relation Age of Onset  . Hypertension Mother   . Stroke Mother   . Diabetes Father   . Lupus Sister     Social history:  reports that he quit smoking about 42 years ago. His smoking use included Cigarettes. He smoked 0.00 packs per day. He has never used smokeless tobacco. He reports that he does not drink alcohol or use illicit drugs.  Allergies: No Known Allergies  Medications:  Current Outpatient Prescriptions on File Prior to Visit  Medication Sig Dispense Refill  . aspirin 81 MG tablet Take 81 mg by mouth daily.        . Cholecalciferol (VITAMIN D PO) Take 10,000 Units by mouth daily.        . fish oil-omega-3 fatty acids 1000 MG capsule Take 1 g by mouth 2 (two) times daily.       . metoprolol succinate (TOPROL-XL) 25  MG 24 hr tablet Take by mouth daily.       Marland Kitchen Specialty Vitamins Products (MAGNESIUM, AMINO ACID CHELATE,) 133 MG tablet Take 1 tablet by mouth daily.         No current facility-administered medications on file prior to visit.    ROS:  Out of a complete 14 system review of symptoms, the patient complains only of the following symptoms, and all other reviewed systems are negative.  Seizures Ringing in the ears  Blood pressure 113/66, pulse 59, height 5\' 7"  (1.702 m), weight 201 lb (91.173 kg).  Physical Exam  General: The patient is alert and cooperative at the time of the examination.  Skin: No significant peripheral edema is noted.   Neurologic Exam  Cranial nerves: Facial symmetry is present. Speech is normal, no aphasia or dysarthria is noted. Extraocular movements are full. Visual fields are full. Some end-gaze nystagmus was seen bilaterally with horizontal gaze.  Motor: The patient has good strength in all 4 extremities.  Coordination: The patient has good finger-nose-finger and heel-to-shin bilaterally.  Gait and station: The patient has a normal gait. Tandem gait is normal. Romberg is negative. No drift is seen.  Reflexes: Deep tendon reflexes are symmetric.   Assessment/Plan:  One. History seizures, under good control  The patient will continue the Carbatrol for  now. The patient indicates that his primary care physician has recently drawn blood work. If desired, the patient may consolidate physicians, and go only to his primary care physician if he is willing to write the prescription for the Carbatrol. Otherwise, the patient will followup in one year.  Marlan Palau MD 09/25/2012 7:06 PM  [facility] [address] [place], [place] [address]  Phone [phone] Fax [phone]

## 2012-10-09 ENCOUNTER — Ambulatory Visit (INDEPENDENT_AMBULATORY_CARE_PROVIDER_SITE_OTHER): Payer: 59 | Admitting: Cardiology

## 2012-10-09 ENCOUNTER — Encounter: Payer: Self-pay | Admitting: Cardiology

## 2012-10-09 VITALS — BP 132/68 | HR 60 | Ht 68.0 in | Wt 202.0 lb

## 2012-10-09 DIAGNOSIS — I2510 Atherosclerotic heart disease of native coronary artery without angina pectoris: Secondary | ICD-10-CM

## 2012-10-09 DIAGNOSIS — I10 Essential (primary) hypertension: Secondary | ICD-10-CM

## 2012-10-09 DIAGNOSIS — E785 Hyperlipidemia, unspecified: Secondary | ICD-10-CM

## 2012-10-11 NOTE — Progress Notes (Signed)
Patient ID: [name], male   DOB: Aug 16, 1950, 62 y.o.   MRN: [id number] PCP: Dr. Oneta Rack  62 yo with history of CAD s/p single vessel LIMA-LAD in 4/08 presents for cardiology followup.  He had an ETT-myoview in 5/13 that was a normal study.  Since then, he has had no problems from a cardiac standpoint.  No chest pain, no exertional dyspnea.  He walks outside for exercise, gardens, and uses a treadmill.   Labs (4/13): K 4.4, creatinine 0.94, LDL 74, HDL 41  PMH: 1. Hyperlipidemia 2. Seizure disorder 3. CAD: s/p CABG in 4/08 with off-pump LIMA-LAD.  Stress echo in 2010 was normal.  ETT-Myoview in 5/13 with EF 56%, no ischemia or infarction.   FH: Father with PTCA in his 72s.   SH: Nonsmoker, runs optical lab for ophthalmologist, married.   ROS: All systems reviewed and negative except as per HPI.   Current Outpatient Prescriptions  Medication Sig Dispense Refill  . aspirin 81 MG tablet Take 81 mg by mouth daily.        . carbamazepine (CARBATROL) 300 MG 12 hr capsule Take 1 capsule (300 mg total) by mouth 2 (two) times daily.  180 capsule  3  . Cholecalciferol (VITAMIN D PO) Take 10,000 Units by mouth daily.        . fish oil-omega-3 fatty acids 1000 MG capsule Take 1 g by mouth 2 (two) times daily.       . metoprolol succinate (TOPROL-XL) 25 MG 24 hr tablet Take by mouth daily.       . rosuvastatin (CRESTOR) 20 MG tablet Take 20 mg by mouth daily.      Marland Kitchen Specialty Vitamins Products (MAGNESIUM, AMINO ACID CHELATE,) 133 MG tablet Take 1 tablet by mouth daily.         No current facility-administered medications for this visit.    BP 132/68  Pulse 60  Ht 5\' 8"  (1.727 m)  Wt 202 lb (91.627 kg)  BMI 30.72 kg/m2  SpO2 98% General: NAD Neck: No JVD, no thyromegaly or thyroid nodule.  Lungs: Clear to auscultation bilaterally with normal respiratory effort. CV: Nondisplaced PMI.  Heart regular S1/S2, no S3/S4, no murmur.  No peripheral edema.  No carotid bruit.  Normal pedal  pulses.  Abdomen: Soft, nontender, no hepatosplenomegaly, no distention.  Neurologic: Alert and oriented x 3.  Psych: Normal affect. Extremities: No clubbing or cyanosis.   Assessment/Plan: 1. CAD: No ischemic symptoms.  Negative stress myoview in 5/13.  Continue ASA 81, statin, and Toprol XL.  2. Hyperlipidemia: I will call for a copy of lipids (done recently by Dr Oneta Rack).  Marca Ancona 10/11/2012

## 2013-04-03 ENCOUNTER — Ambulatory Visit: Payer: No Typology Code available for payment source | Admitting: Internal Medicine

## 2013-04-03 ENCOUNTER — Encounter: Payer: Self-pay | Admitting: Internal Medicine

## 2013-04-03 VITALS — BP 142/70 | HR 60 | Temp 97.7°F | Resp 18 | Ht 68.0 in | Wt 207.2 lb

## 2013-04-03 DIAGNOSIS — G40909 Epilepsy, unspecified, not intractable, without status epilepticus: Secondary | ICD-10-CM

## 2013-04-03 DIAGNOSIS — R7309 Other abnormal glucose: Secondary | ICD-10-CM

## 2013-04-03 DIAGNOSIS — Z79899 Other long term (current) drug therapy: Secondary | ICD-10-CM | POA: Insufficient documentation

## 2013-04-03 DIAGNOSIS — E559 Vitamin D deficiency, unspecified: Secondary | ICD-10-CM | POA: Insufficient documentation

## 2013-04-03 DIAGNOSIS — E785 Hyperlipidemia, unspecified: Secondary | ICD-10-CM

## 2013-04-03 DIAGNOSIS — I10 Essential (primary) hypertension: Secondary | ICD-10-CM

## 2013-04-03 LAB — CBC WITH DIFFERENTIAL/PLATELET
Basophils Relative: 0 % (ref 0–1)
Eosinophils Absolute: 0 10*3/uL (ref 0.0–0.7)
Eosinophils Relative: 1 % (ref 0–5)
MCH: 31.1 pg (ref 26.0–34.0)
MCHC: 35.4 g/dL (ref 30.0–36.0)
MCV: 87.7 fL (ref 78.0–100.0)
Monocytes Relative: 11 % (ref 3–12)
Neutrophils Relative %: 52 % (ref 43–77)
Platelets: 169 10*3/uL (ref 150–400)

## 2013-04-03 LAB — LIPID PANEL
Cholesterol: 150 mg/dL (ref 0–200)
Triglycerides: 150 mg/dL — ABNORMAL HIGH (ref <150)
VLDL: 30 mg/dL (ref 0–40)

## 2013-04-03 LAB — BASIC METABOLIC PANEL WITH GFR
CO2: 32 mEq/L (ref 19–32)
Calcium: 9.5 mg/dL (ref 8.4–10.5)
Creat: 1.07 mg/dL (ref 0.50–1.35)
GFR, Est African American: 84 mL/min
Glucose, Bld: 94 mg/dL (ref 70–99)
Sodium: 139 mEq/L (ref 135–145)

## 2013-04-03 LAB — MAGNESIUM: Magnesium: 1.9 mg/dL (ref 1.5–2.5)

## 2013-04-03 LAB — HEPATIC FUNCTION PANEL
ALT: 18 U/L (ref 0–53)
AST: 20 U/L (ref 0–37)
Albumin: 4.3 g/dL (ref 3.5–5.2)
Bilirubin, Direct: 0.1 mg/dL (ref 0.0–0.3)
Total Protein: 7 g/dL (ref 6.0–8.3)

## 2013-04-03 LAB — HEMOGLOBIN A1C: Mean Plasma Glucose: 117 mg/dL — ABNORMAL HIGH (ref <117)

## 2013-04-03 NOTE — Progress Notes (Signed)
Patient ID: [name], male   DOB: [birth year], 62 y.o.   MRN: [id number]  HPI: Patient presents for 3 month follow up with hypertension, hyperlipidemia, hx/o abnormal glucose and vitamin D deficiency.   BP has been controlled at home. Today's BP is 142/70 rechecked at 136/76 . Patient denies any cardiac type chest pain, palpitations, dyspnea/orthopnea/PND, dizziness, claudication, or dependent edema.  Hyperlipidemia is controlled with diet & meds with last cholesterol 144, HDL 43, and LDL 66 at goal. Patient denies myalgias or other med SE's.   Also, patient has history of abnormal glucose with last A1c of 5.4% in may. Patient denies any reactive hypoglycemic episodes, visual blurring, diabetic polys or paresthesias.  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 He was recently released from f/u by Dr Anne Hahn with intention for continued follow up here.  Further, Patient has history of vitamin D deficiency with pt supplementing same   Reviewed Meds & Allergies   PMHx: Reviewed / unchanged  FHx: Reviewed / unchanged  SHx: Reviewed / unchanged  Systems Review: Constitutional: Denies fever, chills, wt changes, headaches, insomnia, fatigue, night sweats, change in appetite. Eyes: Denies redness, blurred vision, diplopia, discharge, itchy, watery eyes.  ENT: Denies discharge, congestion, post nasal drip, epistaxis, sore throat, earache, hearing loss, dental pain, Tinnitus, Vertigo, Sinus pain, snoring.  CV: Denies chest pain, palpitations, irregular heartbeat, syncope, dyspnea, diaphoresis, orthopnea, PND, claudication, edema Respiratory: denies cough, dyspnea, DOE, pleurisy, hoarseness, laryngitis, wheezing.  Gastrointestinal: Denies dysphagia, odynophagia, heartburn, reflux, water brash, pain, cramps, nausea,  vomiting, bloating, diarrhea, constipation, hematemesis, melena, hematochezia, jaundice, hemorrhoids Genitourinary: Denies dysuria, frequency, urgency, nocturia, hesitancy, discharge, hematuria, flank pain Musculoskeletal: Denies arthralgias, myalgias, stiffness, Jt. Swelling, pain, limp, strain/sprain.  Skin: Denies pruritus, rash, hives, warts, acne, eczema, change in skin lesion(s) Neuro: No weakness, tremor, incoordination, spasms, paresthesia, pain Psychiatric: Denies confusion, memory loss, sensory loss Endo: Denies change in weight, skin, hair change, nocturia, diabetic polys, paresthesias, visual blurring, hyper/hypo-glycemic episodes.  Heme/Lymph: Excessive bleeding, bruising, enlarged lymph nodes  Vital Signs normal with BP 142/70 rechecked at 136/76  On Exam: Appears well nourished - in no distress. Eyes: PERRLA, EOMs, conjunctiva no swelling or erythema. Sinuses: No frontal/maxillary tenderness ENT/Mouth: EAC's clear, TM's nl w/o erythema, bulging. Nares clear w/o erythema, swelling, exudates. Oropharynx clear. Dentition - unremarkable. No erythema. Tongue normal, non obstructing. Hearing intact.  Neck: Supple. Car 2+/2+. Thyroid nl. No LN, bruits or JVD. Chest: Respirations nl, clear  & equal BS w/o  rales, rhonchi, wheezing or stridor.  Cor: Heart sounds normal w/ regular rate and rhythm without sig. murmurs, gallops,clicks, or rubs. Peripheral pulses normal and equal  without edema.  Abdomen: Soft, flat & bowel sounds normal. Non-tender w/o guarding, rebound, hernias, masses, or organomegaly.  Lymphatics: Non tender without lymphadenopathy.  Musculoskeletal: Full ROM all peripheral extremities, joint stability, 5/5 strength, and normal gait.  Skin: Warm, dry without exposed rashes, lesions, ecchymosis apparent.  Neuro: Cranial nerves intact, reflexes equal bilaterally. Sensory-motor testing grossly intact. Tendon reflexes grossly intact.  Pysch: Alert & oriented x 3. Insight  and judgement nl & appropriate. No ideations.  Assessment and Plan:  1. Hypertension - Continue medication, monitor blood pressure at home. Continue diet/meds.  2. Hyperlipidemia - Continue diet/meds, exercise,& lifestyle modifications. Continue monitor cholesterol/LFT's.   3. Hx/o abnormal glucose - Continue diet, exercise,  lifestyle modifications. Monitor appropriate labs.   4. Vitamin D Deficiency - Continue supplementation.

## 2013-04-03 NOTE — Patient Instructions (Signed)
Continue diet and meds as discussed. Further disposition pending results of labs.     Hypertension As your heart beats, it forces blood through your arteries. This force is your blood pressure. If the pressure is too high, it is called hypertension (HTN) or high blood pressure. HTN is dangerous because you may have it and not know it. High blood pressure may mean that your heart has to work harder to pump blood. Your arteries may be narrow or stiff. The extra work puts you at risk for heart disease, stroke, and other problems.  Blood pressure consists of two numbers, a higher number over a lower, 110/72, for example. It is stated as "110 over 72." The ideal is below 120 for the top number (systolic) and under 80 for the bottom (diastolic). Write down your blood pressure today. You should pay close attention to your blood pressure if you have certain conditions such as:  Heart failure.  Prior heart attack.  Diabetes  Chronic kidney disease.  Prior stroke.  Multiple risk factors for heart disease. To see if you have HTN, your blood pressure should be measured while you are seated with your arm held at the level of the heart. It should be measured at least twice. A one-time elevated blood pressure reading (especially in the Emergency Department) does not mean that you need treatment. There may be conditions in which the blood pressure is different between your right and left arms. It is important to see your caregiver soon for a recheck. Most people have essential hypertension which means that there is not a specific cause. This type of high blood pressure may be lowered by changing lifestyle factors such as:  Stress.  Smoking.  Lack of exercise.  Excessive weight.  Drug/tobacco/alcohol use.  Eating less salt. Most people do not have symptoms from high blood pressure until it has caused damage to the body. Effective treatment can often prevent, delay or reduce that  damage. TREATMENT  When a cause has been identified, treatment for high blood pressure is directed at the cause. There are a large number of medications to treat HTN. These fall into several categories, and your caregiver will help you select the medicines that are best for you. Medications may have side effects. You should review side effects with your caregiver. If your blood pressure stays high after you have made lifestyle changes or started on medicines,   Your medication(s) may need to be changed.  Other problems may need to be addressed.  Be certain you understand your prescriptions, and know how and when to take your medicine.  Be sure to follow up with your caregiver within the time frame advised (usually within two weeks) to have your blood pressure rechecked and to review your medications.  If you are taking more than one medicine to lower your blood pressure, make sure you know how and at what times they should be taken. Taking two medicines at the same time can result in blood pressure that is too low. SEEK IMMEDIATE MEDICAL CARE IF:  You develop a severe headache, blurred or changing vision, or confusion.  You have unusual weakness or numbness, or a faint feeling.  You have severe chest or abdominal pain, vomiting, or breathing problems. MAKE SURE YOU:   Understand these instructions.  Will watch your condition.  Will get help right away if you are not doing well or get worse. Document Released: 05/08/2005 Document Revised: 07/31/2011 Document Reviewed: 12/27/2007 [employer] Patient Information 2014 [place]. Vitamin D Deficiency Vitamin D is an important vitamin that your body needs. Having too little of it in your body is called a deficiency. A very bad deficiency can make your bones soft and can cause a condition called rickets.  Vitamin D is important to your body for different reasons, such as:   It helps your body absorb 2 minerals called calcium and  phosphorus.  It helps make your bones healthy.  It may prevent some diseases, such as diabetes and multiple sclerosis.  It helps your muscles and heart. You can get vitamin D in several ways. It is a natural part of some foods. The vitamin is also added to some dairy products and cereals. Some people take vitamin D supplements. Also, your body makes vitamin D when you are in the sun. It changes the sun's rays into a form of the vitamin that your body can use. CAUSES   Not eating enough foods that contain vitamin D.  Not getting enough sunlight.  Having certain digestive system diseases that make it hard to absorb vitamin D. These diseases include Crohn's disease, chronic pancreatitis, and cystic fibrosis.  Having a surgery in which part of the stomach or small intestine is removed.  Being obese. Fat cells pull vitamin D out of your blood. That means that obese people may not have enough vitamin D left in their blood and in other body tissues.  Having chronic kidney or liver disease. RISK FACTORS Risk factors are things that make you more likely to develop a vitamin D deficiency. They include:  Being older.  Not being able to get outside very much.  Living in a nursing home.  Having had broken bones.  Having weak or thin bones (osteoporosis).  Having a disease or condition that changes how your body absorbs vitamin D.  Having dark skin.  Some medicines such as seizure medicines or steroids.  Being overweight or obese. SYMPTOMS Mild cases of vitamin D deficiency may not have any symptoms. If you have a very bad case, symptoms may include:  Bone pain.  Muscle pain.  Falling often.  Broken bones caused by a minor injury, due to osteoporosis. DIAGNOSIS A blood test is the best way to tell if you have a vitamin D deficiency. TREATMENT Vitamin D deficiency can be treated in different ways. Treatment for vitamin D deficiency depends on what is causing it. Options  include:  Taking vitamin D supplements.  Taking a calcium supplement. Your caregiver will suggest what dose is best for you. HOME CARE INSTRUCTIONS  Take any supplements that your caregiver prescribes. Follow the directions carefully. Take only the suggested amount.  Have your blood tested 2 months after you start taking supplements.  Eat foods that contain vitamin D. Healthy choices include:  Fortified dairy products, cereals, or juices. Fortified means vitamin D has been added to the food. Check the label on the package to be sure.  Fatty fish like salmon or trout.  Eggs.  Oysters.  Do not use a tanning bed.  Keep your weight at a healthy level. Lose weight if you need to.  Keep all follow-up appointments. Your caregiver will need to perform blood tests to make sure your vitamin D deficiency is going away. SEEK MEDICAL CARE IF:  You have any questions about your treatment.  You continue to have symptoms of vitamin D deficiency.  You have nausea or vomiting.  You are constipated.  You feel confused.  You have severe abdominal or back  pain. MAKE SURE YOU:  Understand these instructions.  Will watch your condition.  Will get help right away if you are not doing well or get worse. Document Released: 07/31/2011 Document Revised: 09/02/2012 Document Reviewed: 07/31/2011 [facility] Patient Information 2014 [place], [place].   Cholesterol Cholesterol is a white, waxy, fat-like protein needed by your body in small amounts. The liver makes all the cholesterol you need. It is carried from the liver by the blood through the blood vessels. Deposits (plaque) may build up on blood vessel walls. This makes the arteries narrower and stiffer. Plaque increases the risk for heart attack and stroke. You cannot feel your cholesterol level even if it is very high. The only way to know is by a blood test to check your lipid (fats) levels. Once you know your cholesterol levels, you should  keep a record of the test results. Work with your caregiver to to keep your levels in the desired range. WHAT THE RESULTS MEAN:  Total cholesterol is a rough measure of all the cholesterol in your blood.  LDL is the so-called bad cholesterol. This is the type that deposits cholesterol in the walls of the arteries. You want this level to be low.  HDL is the good cholesterol because it cleans the arteries and carries the LDL away. You want this level to be high.  Triglycerides are fat that the body can either burn for energy or store. High levels are closely linked to heart disease. DESIRED LEVELS:  Total cholesterol below 200.  LDL below 100 for people at risk, below 70 for very high risk.  HDL above 50 is good, above 60 is best.  Triglycerides below 150. HOW TO LOWER YOUR CHOLESTEROL:  Diet.  Choose fish or white meat chicken and [place], roasted or baked. Limit fatty cuts of red meat, fried foods, and processed meats, such as sausage and lunch meat.  Eat lots of fresh fruits and vegetables. Choose whole grains, beans, pasta, potatoes and cereals.  Use only small amounts of olive, corn or canola oils. Avoid butter, mayonnaise, shortening or palm kernel oils. Avoid foods with trans-fats.  Use skim/nonfat milk and low-fat/nonfat yogurt and cheeses. Avoid whole milk, cream, ice cream, egg yolks and cheeses. Healthy desserts include angel food cake, ginger snaps, animal crackers, hard candy, popsicles, and low-fat/nonfat frozen yogurt. Avoid pastries, cakes, pies and cookies.  Exercise.  A regular program helps decrease LDL and raises HDL.  Helps with weight control.  Do things that increase your activity level like gardening, walking, or taking the stairs.  Medication.  May be prescribed by your caregiver to help lowering cholesterol and the risk for heart disease.  You may need medicine even if your levels are normal if you have several risk factors. HOME CARE INSTRUCTIONS    Follow your diet and exercise programs as suggested by your caregiver.  Take medications as directed.  Have blood work done when your caregiver feels it is necessary. MAKE SURE YOU:   Understand these instructions.  Will watch your condition.  Will get help right away if you are not doing well or get worse. Document Released: 01/31/2001 Document Revised: 07/31/2011 Document Reviewed: 07/24/2007 [facility] Patient Information 2014 [place], [place].

## 2013-04-04 LAB — INSULIN, FASTING: Insulin fasting, serum: 19 u[IU]/mL (ref 3–28)

## 2013-04-04 LAB — CARBAMAZEPINE LEVEL, TOTAL: Carbamazepine Lvl: 7.8 ug/mL (ref 4.0–12.0)

## 2013-04-04 LAB — VITAMIN D 25 HYDROXY (VIT D DEFICIENCY, FRACTURES): Vit D, 25-Hydroxy: 67 ng/mL (ref 30–89)

## 2013-04-21 ENCOUNTER — Telehealth: Payer: Self-pay | Admitting: *Deleted

## 2013-04-21 NOTE — Telephone Encounter (Signed)
PT IS CALL

## 2013-04-21 NOTE — Telephone Encounter (Signed)
PT IS CALLING JUST QUESTIONING RATHER OR NOT A LETTER WAS SENT TO DR WILLIS STATING THAT WE R GOING TO MONITOR PTS MEDICATION?

## 2013-05-23 ENCOUNTER — Ambulatory Visit (INDEPENDENT_AMBULATORY_CARE_PROVIDER_SITE_OTHER): Payer: Medicare Other | Admitting: Emergency Medicine

## 2013-05-23 ENCOUNTER — Encounter: Payer: Self-pay | Admitting: Emergency Medicine

## 2013-05-23 VITALS — BP 134/86 | HR 68 | Temp 98.2°F | Resp 18 | Ht 67.25 in | Wt 206.0 lb

## 2013-05-23 DIAGNOSIS — J029 Acute pharyngitis, unspecified: Secondary | ICD-10-CM

## 2013-05-23 DIAGNOSIS — R059 Cough, unspecified: Secondary | ICD-10-CM

## 2013-05-23 DIAGNOSIS — R05 Cough: Secondary | ICD-10-CM | POA: Diagnosis not present

## 2013-05-23 DIAGNOSIS — J309 Allergic rhinitis, unspecified: Secondary | ICD-10-CM

## 2013-05-23 MED ORDER — AZITHROMYCIN 250 MG PO TABS
ORAL_TABLET | ORAL | Status: AC
Start: 2013-05-23 — End: 2013-05-28

## 2013-05-23 MED ORDER — HYDROCODONE-ACETAMINOPHEN 5-325 MG PO TABS: 1.0000 | ORAL_TABLET | Freq: Four times a day (QID) | ORAL | Status: DC | PRN

## 2013-05-23 MED ORDER — PREDNISONE 10 MG PO TABS: ORAL_TABLET | ORAL | Status: DC

## 2013-05-23 NOTE — Patient Instructions (Signed)
Viral and Bacterial Pharyngitis Pharyngitis is a sore throat. It is an infection of the back of the throat (pharynx). HOME CARE   Only take medicine as told by your doctor. You may get sick again if you do not take medicine as told.  Drink enough fluids to keep your pee (urine) clear or pale yellow.  Rest.  Rinse your mouth (gargle) with salt water ( teaspoon of salt in 8 ounces of water) every 1 to 2 hours. This will help the pain.  For children over the age of 53, suck on hard candy or sore throat lozenges. GET HELP RIGHT AWAY IF:   There are large, tender lumps in your neck.  You have a rash.  You cough up green, yellow-brown, or bloody mucus.  You have a stiff neck.  There is redness, puffiness (swelling), or very bad pain anywhere on the neck.  You drool or are unable to swallow liquids.  You throw up (vomit) or are not able to keep medicine or liquids down.  You have very bad pain that will not stop with medicine.  You have problems breathing (not from a stuffy nose).  You cannot open your mouth completely.  You or your child has a temperature by mouth above 102 F (38.9 C), not controlled by medicine.  Your baby is older than 3 months with a rectal temperature of 102 F (38.9 C) or higher.  Your baby is 44 months old or younger with a rectal temperature of 100.4 F (38 C) or higher. MAKE SURE YOU:   Understand these instructions.  Will watch this condition.  Will get help right away if you or your child is not doing well or gets worse. Document Released: 10/25/2007 Document Revised: 07/31/2011 Document Reviewed: 06/07/2009 [facility] Patient Information 2014 [place], [place]. Allergic Rhinitis Allergic rhinitis is when the mucous membranes in the nose respond to allergens. Allergens are particles in the air that cause your body to have an allergic reaction. This causes you to release allergic antibodies. Through a chain of events, these eventually cause you to  release histamine into the blood stream (hence the use of antihistamines). Although meant to be protective to the body, it is this release that causes your discomfort, such as frequent sneezing, congestion and an itchy runny nose.  CAUSES  The pollen allergens may come from grasses, trees, and weeds. This is seasonal allergic rhinitis, or "hay fever." Other allergens cause year-round allergic rhinitis (perennial allergic rhinitis) such as house dust mite allergen, pet dander and mold spores.  SYMPTOMS   Nasal stuffiness (congestion).  Runny, itchy nose with sneezing and tearing of the eyes.  There is often an itching of the mouth, eyes and ears. It cannot be cured, but it can be controlled with medications. DIAGNOSIS  If you are unable to determine the offending allergen, skin or blood testing may find it. TREATMENT   Avoid the allergen.  Medications and allergy shots (immunotherapy) can help.  Hay fever may often be treated with antihistamines in pill or nasal spray forms. Antihistamines block the effects of histamine. There are over-the-counter medicines that may help with nasal congestion and swelling around the eyes. Check with your caregiver before taking or giving this medicine. If the treatment above does not work, there are many new medications your caregiver can prescribe. Stronger medications may be used if initial measures are ineffective. Desensitizing injections can be used if medications and avoidance fails. Desensitization is when a patient is given ongoing shots until  the body becomes less sensitive to the allergen. Make sure you follow up with your caregiver if problems continue. SEEK MEDICAL CARE IF:   You develop fever (more than 100.5 F (38.1 C).  You develop a cough that does not stop easily (persistent).  You have shortness of breath.  You start wheezing.  Symptoms interfere with normal daily activities. Document Released: 01/31/2001 Document Revised:  07/31/2011 Document Reviewed: 08/12/2008 [facility] Patient Information 2014 [place].

## 2013-05-23 NOTE — Progress Notes (Signed)
   Subjective:    Patient ID: [name], male    DOB: 05/19/1951, 63 y.o.   MRN: [id number]  HPI Comments: 63 yo male with + exposure to sick wife and grandson. Sudafed/ Ny/daquil/ delsym no relief. ST x 3 days increasing, now with dry cough.  Fever/ chills and HA x 1 day.  Sore Throat  Associated symptoms include congestion and coughing.    Current Outpatient Prescriptions on File Prior to Visit  Medication Sig Dispense Refill  . aspirin 81 MG tablet Take 81 mg by mouth daily.        . carbamazepine (CARBATROL) 300 MG 12 hr capsule Take 1 capsule (300 mg total) by mouth 2 (two) times daily.  180 capsule  3  . Cholecalciferol (VITAMIN D PO) Take 10,000 Units by mouth daily.        . fish oil-omega-3 fatty acids 1000 MG capsule Take 1 g by mouth 2 (two) times daily.       . metoprolol succinate (TOPROL-XL) 25 MG 24 hr tablet Take by mouth daily.       . rosuvastatin (CRESTOR) 20 MG tablet Take 20 mg by mouth daily.       No current facility-administered medications on file prior to visit.   ALLERGIES Lipitor and Niacin and related  Past Medical History  Diagnosis Date  . Plantar fasciitis   . Ischemic heart disease   . Seizure disorder   . Hyperlipidemia   . Dyslipidemia   . Bilateral carpal tunnel syndrome      Review of Systems  Constitutional: Positive for fever, chills and fatigue.  HENT: Positive for congestion, postnasal drip, sore throat and voice change.   Respiratory: Positive for cough.   All other systems reviewed and are negative.   BP 134/86  Pulse 68  Temp(Src) 98.2 F (36.8 C) (Temporal)  Resp 18  Ht 5' 7.25" (1.708 m)  Wt 206 lb (93.441 kg)  BMI 32.03 kg/m2     Objective:   Physical Exam  Nursing note and vitals reviewed. Constitutional: He is oriented to person, place, and time. He appears well-developed and well-nourished.  HENT:  Head: Normocephalic and atraumatic.  Right Ear: External ear normal.  Left Ear: External ear normal.   Nose: Nose normal.  Mouth/Throat: Oropharynx is clear and moist. No oropharyngeal exudate.  P. Pharynx erythema, exudate yellow  Eyes: Conjunctivae are normal.  Neck: Normal range of motion.  Cardiovascular: Normal rate, regular rhythm, normal heart sounds and intact distal pulses.   Pulmonary/Chest: Effort normal and breath sounds normal.  Abdominal: Soft.  Musculoskeletal: Normal range of motion.  Lymphadenopathy:    He has cervical adenopathy.  Neurological: He is alert and oriented to person, place, and time.  Skin: Skin is warm and dry.  Psychiatric: He has a normal mood and affect. Judgment normal.          Assessment & Plan:  PHARYNGITIS/ COUGH/ Allergic rhinitis- Allegra OTC, increase H2o, allergy hygiene explained. ZPak/ Pred 10 mg DP/ norco 5/325 all AD. Salt h20 gargles. TSP liquid maalox and TSP liquid benadryl- mix/ gargle/ spit

## 2013-08-07 DIAGNOSIS — L821 Other seborrheic keratosis: Secondary | ICD-10-CM | POA: Diagnosis not present

## 2013-09-25 ENCOUNTER — Ambulatory Visit: Payer: 59 | Admitting: Neurology

## 2013-09-30 ENCOUNTER — Encounter: Payer: Self-pay | Admitting: Internal Medicine

## 2013-09-30 ENCOUNTER — Ambulatory Visit (INDEPENDENT_AMBULATORY_CARE_PROVIDER_SITE_OTHER): Payer: Medicare Other | Admitting: Internal Medicine

## 2013-09-30 ENCOUNTER — Other Ambulatory Visit: Payer: Self-pay | Admitting: Internal Medicine

## 2013-09-30 VITALS — BP 178/90 | HR 56 | Temp 98.0°F | Resp 18 | Ht 68.0 in | Wt 197.0 lb

## 2013-09-30 DIAGNOSIS — Z789 Other specified health status: Secondary | ICD-10-CM

## 2013-09-30 DIAGNOSIS — Z1212 Encounter for screening for malignant neoplasm of rectum: Secondary | ICD-10-CM

## 2013-09-30 DIAGNOSIS — Z0000 Encounter for general adult medical examination without abnormal findings: Secondary | ICD-10-CM

## 2013-09-30 DIAGNOSIS — Z125 Encounter for screening for malignant neoplasm of prostate: Secondary | ICD-10-CM | POA: Diagnosis not present

## 2013-09-30 DIAGNOSIS — E785 Hyperlipidemia, unspecified: Secondary | ICD-10-CM | POA: Diagnosis not present

## 2013-09-30 DIAGNOSIS — G40309 Generalized idiopathic epilepsy and epileptic syndromes, not intractable, without status epilepticus: Secondary | ICD-10-CM | POA: Diagnosis not present

## 2013-09-30 DIAGNOSIS — Z79899 Other long term (current) drug therapy: Secondary | ICD-10-CM

## 2013-09-30 DIAGNOSIS — E559 Vitamin D deficiency, unspecified: Secondary | ICD-10-CM

## 2013-09-30 DIAGNOSIS — R7309 Other abnormal glucose: Secondary | ICD-10-CM | POA: Diagnosis not present

## 2013-09-30 DIAGNOSIS — I10 Essential (primary) hypertension: Secondary | ICD-10-CM | POA: Diagnosis not present

## 2013-09-30 DIAGNOSIS — R29898 Other symptoms and signs involving the musculoskeletal system: Secondary | ICD-10-CM

## 2013-09-30 LAB — CBC WITH DIFFERENTIAL/PLATELET
Basophils Absolute: 0 10*3/uL (ref 0.0–0.1)
Basophils Relative: 0 % (ref 0–1)
EOS ABS: 0 10*3/uL (ref 0.0–0.7)
EOS PCT: 1 % (ref 0–5)
HCT: 41 % (ref 39.0–52.0)
HEMOGLOBIN: 14.5 g/dL (ref 13.0–17.0)
LYMPHS ABS: 1.3 10*3/uL (ref 0.7–4.0)
Lymphocytes Relative: 29 % (ref 12–46)
MCH: 31.2 pg (ref 26.0–34.0)
MCHC: 35.4 g/dL (ref 30.0–36.0)
MCV: 88.2 fL (ref 78.0–100.0)
MONOS PCT: 8 % (ref 3–12)
Monocytes Absolute: 0.4 10*3/uL (ref 0.1–1.0)
Neutro Abs: 2.7 10*3/uL (ref 1.7–7.7)
Neutrophils Relative %: 62 % (ref 43–77)
Platelets: 163 10*3/uL (ref 150–400)
RBC: 4.65 MIL/uL (ref 4.22–5.81)
RDW: 13.4 % (ref 11.5–15.5)
WBC: 4.4 10*3/uL (ref 4.0–10.5)

## 2013-09-30 LAB — HEMOGLOBIN A1C
HEMOGLOBIN A1C: 5.9 % — AB (ref <5.7)
Mean Plasma Glucose: 123 mg/dL — ABNORMAL HIGH (ref <117)

## 2013-09-30 MED ORDER — METOPROLOL SUCCINATE ER 100 MG PO TB24: 100.0000 mg | ORAL_TABLET | Freq: Every day | ORAL | Status: DC

## 2013-09-30 NOTE — Progress Notes (Signed)
Patient ID: [name], male   DOB: [birth year], 63 y.o.   MRN: [id number]   Annual Screening Comprehensive Examination  This very nice 63 y.o.  MWM presents for complete physical.  Patient has been followed for HTN, Diabetes  Prediabetes, Hyperlipidemia, and Vitamin D Deficiency.   HTN predates since 2004. Patient's BP has been controlled at home.Today's BP: 178/90 mmHg. Patient underwent emergant CABG in 2008 for ACS . Patient denies any recent cardiac symptoms as chest pain, palpitations, shortness of breath, dizziness or ankle swelling.   Patient's hyperlipidemia is controlled with diet and medications. Patient denies myalgias or other medication SE's. Last cholesterol in Nov was at goal as below.   Lab Results  Component Value Date   CHOL 150 04/03/2013   HDL 40 04/03/2013   LDLCALC 80 04/03/2013   TRIG 150* 04/03/2013   CHOLHDL 3.8 04/03/2013    Patient has prediabetes and last A1c was 5.7% in Nov 2014. Patient denies reactive hypoglycemic symptoms, visual blurring, diabetic polys or paresthesias.    Patient has Hx/o atypical seizures predating to 1997 manifest with a fugue type state and no major motor activity and it has been years since his last seizure.   Finally, patient has history of Vitamin D Deficiency of  24 in 2008  and last vitamin D was 84 in 2014.  Medication Sig  . aspirin 81 MG tablet Take 81 mg by mouth daily.    . Carbamazepine(CARBATROL)300 mg Take 1 capsule 2  times daily.  . Cholecalciferol (VITAMIN D PO) Take 10,000 Units by mouth daily.    . fish oil Take 1 g by mouth 2 (two) times daily.   . rosuvastatin (CRESTOR) 20 MG  Take 20 mg by mouth daily.   Allergies  Allergen Reactions  . Lipitor [Atorvastatin]     myalgia  . Niacin And Related    Past Medical History  Diagnosis Date  . Plantar fasciitis   . Ischemic heart disease   . Seizure disorder   . Hyperlipidemia   . Dyslipidemia   . Bilateral carpal tunnel syndrome   . Vitamin D  deficiency     Past Surgical History  Procedure Laterality Date  . Coronary artery bypass graft  08/2006  . Cholecystectomy    . Hand surgery     Family History  Problem Relation Age of Onset  . Hypertension Mother   . Stroke Mother   . Diabetes Father   . Lupus Sister    History   Social History  . Marital Status: Married    Spouse Name: N/A    Number of Children: 2  . Years of Education: N/A   Occupational History  . Licensed Optician    Social History Main Topics  . Smoking status: Former Smoker    Types: Cigarettes    Quit date: 05/22/1968  . Smokeless tobacco: Never Used  . Alcohol Use: No  . Drug Use: No  . Sexual Activity: Not on file   Other Topics Concern  . Not on file   Social History Narrative   Patient lives at home with his wife Vaughan Basta)  [place] and works full time. Patient has two children.    ROS Constitutional: Denies fever, chills, weight loss/gain, headaches, insomnia, fatigue, night sweats or change in appetite. Eyes: Denies redness, blurred vision, diplopia, discharge, itchy or watery eyes.  ENT: Denies discharge, congestion, post nasal drip, epistaxis, sore throat, earache, hearing loss, dental pain, Tinnitus, Vertigo, Sinus pain or snoring.  Cardio:  Denies chest pain, palpitations, irregular heartbeat, syncope, dyspnea, diaphoresis, orthopnea, PND, claudication or edema Respiratory: denies cough, dyspnea, DOE, pleurisy, hoarseness, laryngitis or wheezing.  Gastrointestinal: Denies dysphagia, heartburn, reflux, water brash, pain, cramps, nausea, vomiting, bloating, diarrhea, constipation, hematemesis, melena, hematochezia, jaundice or hemorrhoids Genitourinary: Denies dysuria, frequency, urgency, nocturia, hesitancy, discharge, hematuria or flank pain Musculoskeletal: Denies arthralgia, myalgia, stiffness, Jt. Swelling, pain, limp or strain/sprain. Skin: Denies puritis, rash, hives, warts, acne, eczema or change in skin lesion Neuro: No  weakness, tremor, incoordination, spasms, paresthesia or pain Psychiatric: Denies confusion, memory loss or sensory loss Endocrine: Denies change in weight, skin, hair change, nocturia, and paresthesia, diabetic polys, visual blurring or hyper / hypo glycemic episodes.  Heme/Lymph: No excessive bleeding, bruising or enlarged lymph nodes.  Physical Exam  BP 178/90  Pulse 56  Temp(Src) 98 F (36.7 C) (Temporal)  Resp 18  Ht 5\' 8"  (1.727 m)  Wt 197 lb (89.359 kg)  BMI 29.96 kg/m2 BP rechecked at 180/100  General Appearance: Well nourished, in no apparent distress. Eyes: PERRLA, EOMs, conjunctiva no swelling or erythema, normal fundi and vessels. Sinuses: No frontal/maxillary tenderness ENT/Mouth: EACs patent / TMs  nl. Nares clear without erythema, swelling, mucoid exudates. Oral hygiene is good. No erythema, swelling, or exudate. Tongue normal, non-obstructing. Tonsils not swollen or erythematous. Hearing normal.  Neck: Supple, thyroid normal. No bruits, nodes or JVD. Respiratory: Respiratory effort normal.  BS equal and clear bilateral without rales, rhonci, wheezing or stridor. Cardio: Heart sounds are normal with regular rate and rhythm and no murmurs, rubs or gallops. Peripheral pulses are normal and equal bilaterally without edema. No aortic or femoral bruits. Chest: symmetric with normal excursions and percussion.  Abdomen: Flat, soft, with bowl sounds. Nontender, no guarding, rebound, hernias, masses, or organomegaly.  Lymphatics: Non tender without lymphadenopathy.  Genitourinary: No hernias.Testes nl. DRE - prostate nl for age - smooth & firm w/o nodules. Musculoskeletal: Full ROM all peripheral extremities, joint stability, 5/5 strength, and normal gait. Skin: Warm and dry without rashes, lesions, cyanosis, clubbing or  ecchymosis.  Neuro: Cranial nerves intact, reflexes equal bilaterally. Normal muscle tone, no cerebellar symptoms. Sensation intact.  Pysch: Awake and oriented X  3, normal affect, insight and judgment appropriate.   Assessment and Plan  1. Annual Screening Examination 2. Hypertension  3. Hyperlipidemia 4. Pre Diabetes 5. Vitamin D Deficiency 6. ASHD/CABG 7. Atypical Seizures  Continue prudent diet as discussed, weight control, BP monitoring, regular exercise, and medications as discussed.  Discussed med effects and SE's. Routine screening labs and tests as requested with regular follow-up as recommended.   Bp is elevated on recheck and new Rx for Toprol 100 mg Xl & to monitor BP's and titrate 50- 100 Mg to control BP less than 140/80

## 2013-09-30 NOTE — Patient Instructions (Signed)

## 2013-10-01 LAB — URINALYSIS, MICROSCOPIC ONLY
Bacteria, UA: NONE SEEN
CRYSTALS: NONE SEEN
Casts: NONE SEEN
SQUAMOUS EPITHELIAL / LPF: NONE SEEN

## 2013-10-01 LAB — BASIC METABOLIC PANEL WITH GFR
BUN: 13 mg/dL (ref 6–23)
CALCIUM: 9.1 mg/dL (ref 8.4–10.5)
CO2: 27 meq/L (ref 19–32)
Chloride: 104 mEq/L (ref 96–112)
Creat: 0.93 mg/dL (ref 0.50–1.35)
GFR, Est African American: >89 mL/min
GFR, Est Non African American: 86 mL/min
GLUCOSE: 102 mg/dL — AB (ref 70–99)
Potassium: 4.1 mEq/L (ref 3.5–5.3)
Sodium: 140 mEq/L (ref 135–145)

## 2013-10-01 LAB — LIPID PANEL
CHOL/HDL RATIO: 4.3 ratio
Cholesterol: 170 mg/dL (ref 0–200)
HDL: 40 mg/dL (ref >39)
LDL CALC: 90 mg/dL (ref 0–99)
Triglycerides: 200 mg/dL — ABNORMAL HIGH (ref <150)
VLDL: 40 mg/dL (ref 0–40)

## 2013-10-01 LAB — VITAMIN D 25 HYDROXY (VIT D DEFICIENCY, FRACTURES): VIT D 25 HYDROXY: 77 ng/mL (ref 30–89)

## 2013-10-01 LAB — HEPATIC FUNCTION PANEL
ALK PHOS: 60 U/L (ref 39–117)
ALT: 18 U/L (ref 0–53)
AST: 19 U/L (ref 0–37)
Albumin: 4.2 g/dL (ref 3.5–5.2)
BILIRUBIN DIRECT: 0.1 mg/dL (ref 0.0–0.3)
Indirect Bilirubin: 0.3 mg/dL (ref 0.2–1.2)
Total Bilirubin: 0.4 mg/dL (ref 0.2–1.2)
Total Protein: 6.7 g/dL (ref 6.0–8.3)

## 2013-10-01 LAB — TSH: TSH: 3.076 u[IU]/mL (ref 0.350–4.500)

## 2013-10-01 LAB — MICROALBUMIN / CREATININE URINE RATIO
Creatinine, Urine: 108.6 mg/dL
Microalb Creat Ratio: 4.6 mg/g (ref 0.0–30.0)
Microalb, Ur: 0.5 mg/dL (ref 0.00–1.89)

## 2013-10-01 LAB — MAGNESIUM: Magnesium: 1.7 mg/dL (ref 1.5–2.5)

## 2013-10-01 LAB — PSA: PSA: 0.44 ng/mL (ref <=4.00)

## 2013-10-01 LAB — INSULIN, FASTING: Insulin fasting, serum: 15 u[IU]/mL (ref 3–28)

## 2013-10-02 LAB — LEVETIRACETAM LEVEL: Keppra (Levetiracetam): <1 ug/mL

## 2013-10-03 LAB — CARBAMAZEPINE LEVEL, TOTAL: Carbamazepine Lvl: 4.9 ug/mL (ref 4.0–12.0)

## 2013-10-31 ENCOUNTER — Encounter (INDEPENDENT_AMBULATORY_CARE_PROVIDER_SITE_OTHER): Payer: Self-pay

## 2013-10-31 ENCOUNTER — Encounter: Payer: Self-pay | Admitting: Cardiology

## 2013-10-31 ENCOUNTER — Ambulatory Visit (INDEPENDENT_AMBULATORY_CARE_PROVIDER_SITE_OTHER): Payer: Medicare Other | Admitting: Cardiology

## 2013-10-31 VITALS — BP 130/82 | HR 68 | Ht 68.0 in | Wt 200.4 lb

## 2013-10-31 DIAGNOSIS — E785 Hyperlipidemia, unspecified: Secondary | ICD-10-CM | POA: Diagnosis not present

## 2013-10-31 DIAGNOSIS — I2510 Atherosclerotic heart disease of native coronary artery without angina pectoris: Secondary | ICD-10-CM | POA: Diagnosis not present

## 2013-10-31 DIAGNOSIS — I10 Essential (primary) hypertension: Secondary | ICD-10-CM | POA: Diagnosis not present

## 2013-10-31 NOTE — Patient Instructions (Signed)
Your physician wants you to follow-up in: 1 year with Dr Aundra Dubin. (June 2016)  You will receive a reminder letter in the mail two months in advance. If you don't receive a letter, please call our office to schedule the follow-up appointment.

## 2013-11-02 NOTE — Progress Notes (Signed)
Patient ID: [name], male   DOB: 1951-01-30, 63 y.o.   MRN: [id number] PCP: Dr. Melford Aase  63 yo with history of CAD s/p single vessel LIMA-LAD in 4/08 presents for cardiology followup.  He had an ETT-myoview in 5/13 that was a normal study.  Since then, he has had no problems from a cardiac standpoint.  No chest pain, no exertional dyspnea.  He walks outside for exercise, gardens, and uses a treadmill.  He has been working on restoring an old Paul.   Labs (4/13): K 4.4, creatinine 0.94, LDL 74, HDL 41 Labs (5/15): K 4.1, creatinine 0.93, LDL 90, HDL 40  PMH: 1. Hyperlipidemia 2. Seizure disorder 3. CAD: s/p CABG in 4/08 with off-pump LIMA-LAD.  Stress echo in 2010 was normal.  ETT-Myoview in 5/13 with EF 56%, no ischemia or infarction.   FH: Father with PTCA in his 35s.   SH: Nonsmoker, runs optical lab for ophthalmologist, married.   Current Outpatient Prescriptions  Medication Sig Dispense Refill  . aspirin 81 MG tablet Take 81 mg by mouth daily.        . carbamazepine (CARBATROL) 300 MG 12 hr capsule Take 1 capsule (300 mg total) by mouth 2 (two) times daily.  180 capsule  3  . Cholecalciferol (VITAMIN D PO) Take 10,000 Units by mouth daily.        . fish oil-omega-3 fatty acids 1000 MG capsule Take 1 g by mouth 2 (two) times daily.       . metoprolol succinate (TOPROL-XL) 100 MG 24 hr tablet Take 1 tablet (100 mg total) by mouth daily. For Bp  90 tablet  99  . rosuvastatin (CRESTOR) 20 MG tablet Take 20 mg by mouth daily.       No current facility-administered medications for this visit.    BP 130/82  Pulse 68  Ht 5\' 8"  (1.727 m)  Wt 90.901 kg (200 lb 6.4 oz)  BMI 30.48 kg/m2 General: NAD Neck: No JVD, no thyromegaly or thyroid nodule.  Lungs: Clear to auscultation bilaterally with normal respiratory effort. CV: Nondisplaced PMI.  Heart regular S1/S2, no S3/S4, no murmur.  No peripheral edema.  No carotid bruit.  Normal pedal pulses.  Abdomen: Soft, nontender, no  hepatosplenomegaly, no distention.  Neurologic: Alert and oriented x 3.  Psych: Normal affect. Extremities: No clubbing or cyanosis.   Assessment/Plan: 1. CAD: No ischemic symptoms.  Negative stress myoview in 5/13.  Continue ASA 81, statin, and Toprol XL.  2. Hyperlipidemia: I would like his LDL a bit lower but will not change meds.  I encouraged him to work on diet.  Loralie Champagne 11/02/2013

## 2013-11-11 DIAGNOSIS — D485 Neoplasm of uncertain behavior of skin: Secondary | ICD-10-CM | POA: Diagnosis not present

## 2013-11-14 ENCOUNTER — Other Ambulatory Visit: Payer: Self-pay | Admitting: Neurology

## 2013-11-17 ENCOUNTER — Other Ambulatory Visit: Payer: Self-pay | Admitting: *Deleted

## 2013-11-17 MED ORDER — CARBAMAZEPINE ER 300 MG PO CP12: ORAL_CAPSULE | ORAL | Status: DC

## 2014-01-29 ENCOUNTER — Encounter: Payer: Self-pay | Admitting: Physician Assistant

## 2014-01-29 ENCOUNTER — Ambulatory Visit (INDEPENDENT_AMBULATORY_CARE_PROVIDER_SITE_OTHER): Payer: Medicare Other | Admitting: Physician Assistant

## 2014-01-29 VITALS — BP 148/82 | HR 60 | Temp 98.2°F | Resp 16 | Ht 68.0 in | Wt 204.0 lb

## 2014-01-29 DIAGNOSIS — Z789 Other specified health status: Secondary | ICD-10-CM

## 2014-01-29 DIAGNOSIS — I10 Essential (primary) hypertension: Secondary | ICD-10-CM

## 2014-01-29 DIAGNOSIS — Z79899 Other long term (current) drug therapy: Secondary | ICD-10-CM

## 2014-01-29 DIAGNOSIS — R7303 Prediabetes: Secondary | ICD-10-CM

## 2014-01-29 DIAGNOSIS — G40909 Epilepsy, unspecified, not intractable, without status epilepticus: Secondary | ICD-10-CM

## 2014-01-29 DIAGNOSIS — Z1331 Encounter for screening for depression: Secondary | ICD-10-CM | POA: Diagnosis not present

## 2014-01-29 DIAGNOSIS — E669 Obesity, unspecified: Secondary | ICD-10-CM

## 2014-01-29 DIAGNOSIS — R7309 Other abnormal glucose: Secondary | ICD-10-CM | POA: Diagnosis not present

## 2014-01-29 DIAGNOSIS — Z9181 History of falling: Secondary | ICD-10-CM

## 2014-01-29 DIAGNOSIS — E559 Vitamin D deficiency, unspecified: Secondary | ICD-10-CM

## 2014-01-29 DIAGNOSIS — E66811 Obesity, class 1: Secondary | ICD-10-CM | POA: Insufficient documentation

## 2014-01-29 DIAGNOSIS — I2510 Atherosclerotic heart disease of native coronary artery without angina pectoris: Secondary | ICD-10-CM | POA: Diagnosis not present

## 2014-01-29 DIAGNOSIS — E785 Hyperlipidemia, unspecified: Secondary | ICD-10-CM

## 2014-01-29 DIAGNOSIS — Z0000 Encounter for general adult medical examination without abnormal findings: Secondary | ICD-10-CM

## 2014-01-29 LAB — CBC WITH DIFFERENTIAL/PLATELET
BASOS PCT: 1 % (ref 0–1)
Basophils Absolute: 0 10*3/uL (ref 0.0–0.1)
EOS PCT: 2 % (ref 0–5)
Eosinophils Absolute: 0.1 10*3/uL (ref 0.0–0.7)
HCT: 40.8 % (ref 39.0–52.0)
Hemoglobin: 14.5 g/dL (ref 13.0–17.0)
Lymphocytes Relative: 32 % (ref 12–46)
Lymphs Abs: 1.1 10*3/uL (ref 0.7–4.0)
MCH: 30.9 pg (ref 26.0–34.0)
MCHC: 35.5 g/dL (ref 30.0–36.0)
MCV: 86.8 fL (ref 78.0–100.0)
MONOS PCT: 11 % (ref 3–12)
Monocytes Absolute: 0.4 10*3/uL (ref 0.1–1.0)
NEUTROS PCT: 54 % (ref 43–77)
Neutro Abs: 1.9 10*3/uL (ref 1.7–7.7)
Platelets: 154 10*3/uL (ref 150–400)
RBC: 4.7 MIL/uL (ref 4.22–5.81)
RDW: 12.9 % (ref 11.5–15.5)
WBC: 3.5 10*3/uL — ABNORMAL LOW (ref 4.0–10.5)

## 2014-01-29 LAB — BASIC METABOLIC PANEL WITH GFR
BUN: 17 mg/dL (ref 6–23)
CALCIUM: 9.3 mg/dL (ref 8.4–10.5)
CO2: 30 mEq/L (ref 19–32)
CREATININE: 0.97 mg/dL (ref 0.50–1.35)
Chloride: 104 mEq/L (ref 96–112)
GFR, Est Non African American: 82 mL/min
GLUCOSE: 101 mg/dL — AB (ref 70–99)
Potassium: 4.2 mEq/L (ref 3.5–5.3)
SODIUM: 138 meq/L (ref 135–145)

## 2014-01-29 LAB — LIPID PANEL
CHOLESTEROL: 130 mg/dL (ref 0–200)
HDL: 43 mg/dL (ref >39)
LDL Cholesterol: 59 mg/dL (ref 0–99)
Total CHOL/HDL Ratio: 3 Ratio
Triglycerides: 138 mg/dL (ref <150)
VLDL: 28 mg/dL (ref 0–40)

## 2014-01-29 LAB — MAGNESIUM: MAGNESIUM: 1.8 mg/dL (ref 1.5–2.5)

## 2014-01-29 LAB — HEPATIC FUNCTION PANEL
ALBUMIN: 4.4 g/dL (ref 3.5–5.2)
ALT: 17 U/L (ref 0–53)
AST: 21 U/L (ref 0–37)
Alkaline Phosphatase: 60 U/L (ref 39–117)
Bilirubin, Direct: 0.1 mg/dL (ref 0.0–0.3)
Indirect Bilirubin: 0.4 mg/dL (ref 0.2–1.2)
Total Bilirubin: 0.5 mg/dL (ref 0.2–1.2)
Total Protein: 6.8 g/dL (ref 6.0–8.3)

## 2014-01-29 LAB — HEMOGLOBIN A1C
Hgb A1c MFr Bld: 5.9 % — ABNORMAL HIGH (ref <5.7)
MEAN PLASMA GLUCOSE: 123 mg/dL — AB (ref <117)

## 2014-01-29 LAB — TSH: TSH: 3.964 u[IU]/mL (ref 0.350–4.500)

## 2014-01-29 NOTE — Progress Notes (Signed)
MEDICARE ANNUAL WELLNESS VISIT AND FOLLOW UP Assessment:   1. ASHD/CABG 2008, follows with Dr. Aundra Dubin, no CP/SOB.   2. Essential hypertension - CBC with Differential - BASIC METABOLIC PANEL WITH GFR - Hepatic function panel - TSH  3. Seizure disorder Continue follow up with Dr. Jannifer Franklin, and continue medications  4. Encounter for long-term (current) use of other medications - Magnesium  5. Hyperlipidemia -continue medications, check lipids, decrease fatty foods, increase activity. - Lipid panel  6. Vitamin D Deficiency Continue supplement  7. Prediabetes Discussed general issues about diabetes pathophysiology and management., Educational material distributed., Suggested low cholesterol diet., Encouraged aerobic exercise., Discussed foot care., Reminded to get yearly retinal exam. - Hemoglobin A1c - Insulin, fasting  8. Obesity with co morbidities - long discussion about weight loss, diet, and exercise   Plan:   During the course of the visit the patient was educated and counseled about appropriate screening and preventive services including:    Pneumococcal vaccine   Influenza vaccine  Td vaccine  Screening electrocardiogram  Colorectal cancer screening  Diabetes screening  Glaucoma screening  Nutrition counseling   Screening recommendations, referrals: Vaccinations: Tdap vaccine not indicated Influenza vaccine will get a work Pneumococcal vaccine wants info about prevnar 13 Shingles vaccine not indicated Hep B vaccine not indicated  Nutrition assessed and recommended  Colonoscopy due 2016 Recommended yearly ophthalmology/optometry visit for glaucoma screening and checkup Recommended yearly dental visit for hygiene and checkup Advanced directives - requested  Conditions/risks identified: BMI: Discussed weight loss, diet, and increase physical activity.  Increase physical activity: AHA recommends 150 minutes of physical activity a week.   Medications reviewed PreDiabetes is at goal, ACE/ARB therapy: No, Reason not on Ace Inhibitor/ARB therapy:  only PREDIABETES Urinary Incontinence is not an issue: discussed non pharmacology and pharmacology options.  Fall risk: low- discussed PT, home fall assessment, medications.    Subjective:  [name] is a 63 y.o. male who presents for welcome to medicare visit and 3 month follow up for HTN, hyperlipidemia, prediabetes, and vitamin D Def.   His blood pressure has been controlled at home, today their BP is BP: 148/82 mmHg He does workout. He denies chest pain, shortness of breath, dizziness.  He is on cholesterol medication and denies myalgias. His cholesterol is at goal. The cholesterol last visit was:   Lab Results  Component Value Date   CHOL 170 09/30/2013   HDL 40 09/30/2013   LDLCALC 90 09/30/2013   TRIG 200* 09/30/2013   CHOLHDL 4.3 09/30/2013   He has been working on diet and exercise for prediabetes, and denies paresthesia of the feet, polydipsia and polyuria. Last A1C in the office was:  Lab Results  Component Value Date   HGBA1C 5.9* 09/30/2013   Patient is on Vitamin D supplement.   Lab Results  Component Value Date   VD25OH 81 09/30/2013     He has been retired for 3 weeks, starting to walk in the morning after breakfast.  He is on Tegretol for seizures and is stable.  BMI is Body mass index is 31.03 kg/(m^2)., He is working on diet and exercise . Wt Readings from Last 3 Encounters:  01/29/14 204 lb (92.534 kg)  10/31/13 200 lb 6.4 oz (90.901 kg)  09/30/13 197 lb (89.359 kg)    Names of Other Physician/Practitioners you currently use: 1. [facility] here for primary care 2. Dr. Delman Cheadle, eye doctor, last visit yearly 3. Dr. Hassell Done, dentist, last visit q  6 months.  Patient Care Team: [name], MD as PCP - General (Internal Medicine) Kathrynn Ducking, MD as Consulting Physician (Neurology) Larey Dresser, MD as  Consulting Physician (Cardiology)  Medication Review: Current Outpatient Prescriptions on File Prior to Visit  Medication Sig Dispense Refill  . aspirin 81 MG tablet Take 81 mg by mouth daily.        . carbamazepine (CARBATROL) 300 MG 12 hr capsule TAKE 1 CAPSULE BY MOUTH 2 TIMES DAILY.  360 capsule  4  . Cholecalciferol (VITAMIN D PO) Take 10,000 Units by mouth daily.        . fish oil-omega-3 fatty acids 1000 MG capsule Take 1 g by mouth 2 (two) times daily.       . metoprolol succinate (TOPROL-XL) 100 MG 24 hr tablet Take 1 tablet (100 mg total) by mouth daily. For Bp  90 tablet  99  . rosuvastatin (CRESTOR) 20 MG tablet Take 20 mg by mouth daily.       No current facility-administered medications on file prior to visit.    Current Problems (verified) Patient Active Problem List   Diagnosis Date Noted  . Vitamin D Deficiency 04/03/2013  . Encounter for long-term (current) use of other medications 04/03/2013  . CAD (coronary artery disease) 10/01/2011  . ASHD/CABG 08/31/2010  . Hypertension 08/31/2010  . Plantar fasciitis   . Seizure disorder   . Hyperlipidemia     Screening Tests Health Maintenance  Topic Date Due  . Colonoscopy  06/15/1998  . Pneumococcal Polysaccharide Vaccine Age 41 And Over  06/15/2013  . Influenza Vaccine  12/20/2013  . Tetanus/tdap  04/23/2019  . Zostavax  Completed    Immunization History  Administered Date(s) Administered  . Pneumococcal Polysaccharide-23 04/22/2009  . Td 04/22/2009  . Zoster 09/09/2009    Preventative care: Last colonoscopy: 2006 due 2016  Prior vaccinations: TD or Tdap: 2010  Influenza: 2014 gets from emplolyer Pneumococcal: 2010 Prevnar 13: declines Shingles/Zostavax: 2011  History reviewed: allergies, current medications, past family history, past medical history, past social history, past surgical history and problem list   Risk Factors: Tobacco History  Substance Use Topics  . Smoking status: Former  Smoker    Types: Cigarettes    Quit date: 05/22/1968  . Smokeless tobacco: Never Used  . Alcohol Use: No   He does not smoke.  Patient is a former smoker. Are there smokers in your home (other than you)?  No  Alcohol Current alcohol use: none  Caffeine Current caffeine use: coffee 2 /day  Exercise Current exercise: walking  Nutrition/Diet Current diet: in general, a "healthy" diet    Cardiac risk factors: advanced age (older than 61 for men, 63 for women), dyslipidemia, hypertension, male gender, obesity (BMI >= 30 kg/m2) and sedentary lifestyle.  Depression Screen (Note: if answer to either of the following is "Yes", a more complete depression screening is indicated)   Q1: Over the past two weeks, have you felt down, depressed or hopeless? No  Q2: Over the past two weeks, have you felt little interest or pleasure in doing things? No  Have you lost interest or pleasure in daily life? No  Do you often feel hopeless? No  Do you cry easily over simple problems? No  Activities of Daily Living In your present state of health, do you have any difficulty performing the following activities?:  Driving? No Managing money?  No Feeding yourself? No Getting from bed to chair? No Climbing a flight of stairs?  No Preparing food and eating?: No Bathing or showering? No Getting dressed: No Getting to the toilet? No Using the toilet:No Moving around from place to place: No In the past year have you fallen or had a near fall?:Yes - from a ladder, no breaks   Are you sexually active?  Yes  Do you have more than one partner?  No  Vision Difficulties: No  Hearing Difficulties: No Do you often ask people to speak up or repeat themselves? No Do you experience ringing or noises in your ears? No Do you have difficulty understanding soft or whispered voices? No  Cognition  Do you feel that you have a problem with memory?No  Do you often misplace items? No  Do you feel safe at home?   Yes  Advanced directives Does patient have a [place]? Yes Does patient have a Living Will? Yes   Objective:   Blood pressure 148/82, pulse 60, temperature 98.2 F (36.8 C), temperature source Temporal, resp. rate 16, height 5\' 8"  (1.727 m), weight 204 lb (92.534 kg). Body mass index is 31.03 kg/(m^2).  General appearance: alert, no distress, WD/WN, male Cognitive Testing  Alert? Yes  Normal Appearance?Yes  Oriented to person? Yes  Place? Yes   Time? Yes  Recall of three objects?  Yes  Can perform simple calculations? Yes  Displays appropriate judgment?Yes  Can read the correct time from a watch face?Yes  HEENT: normocephalic, sclerae anicteric, TMs pearly, nares patent, no discharge or erythema, pharynx normal Oral cavity: MMM, no lesions Neck: supple, no lymphadenopathy, no thyromegaly, no masses Heart: RRR, normal S1, S2, no murmurs Lungs: CTA bilaterally, no wheezes, rhonchi, or rales Abdomen: +bs, soft, non tender, non distended, no masses, no hepatomegaly, no splenomegaly Musculoskeletal: nontender, no swelling, no obvious deformity Extremities: no edema, no cyanosis, no clubbing Pulses: 2+ symmetric, upper and lower extremities, normal cap refill Neurological: alert, oriented x 3, CN2-12 intact, strength normal upper extremities and lower extremities, sensation normal throughout, DTRs 2+ throughout, no cerebellar signs, gait normal Psychiatric: normal affect, behavior normal, pleasant   Medicare Attestation I have personally reviewed: The patient's medical and social history Their use of alcohol, tobacco or illicit drugs Their current medications and supplements The patient's functional ability including ADLs,fall risks, home safety risks, cognitive, and hearing and visual impairment Diet and physical activities Evidence for depression or mood disorders  The patient's weight, height, BMI, and visual acuity have been recorded in the chart.  I  have made referrals, counseling, and provided education to the patient based on review of the above and I have provided the patient with a written personalized care plan for preventive services.     Vicie Mutters, PA-C   01/29/2014

## 2014-01-29 NOTE — Patient Instructions (Signed)

## 2014-01-30 LAB — INSULIN, FASTING: Insulin fasting, serum: 8.1 u[IU]/mL (ref 2.0–19.6)

## 2014-03-30 ENCOUNTER — Other Ambulatory Visit: Payer: Self-pay | Admitting: *Deleted

## 2014-03-30 MED ORDER — ROSUVASTATIN CALCIUM 40 MG PO TABS: 40.0000 mg | ORAL_TABLET | Freq: Every day | ORAL | Status: DC

## 2014-04-30 ENCOUNTER — Ambulatory Visit: Payer: Self-pay | Admitting: Internal Medicine

## 2014-06-01 ENCOUNTER — Ambulatory Visit: Payer: Self-pay | Admitting: Internal Medicine

## 2014-07-03 ENCOUNTER — Ambulatory Visit: Payer: Self-pay | Admitting: Internal Medicine

## 2014-10-01 ENCOUNTER — Encounter: Payer: Self-pay | Admitting: Internal Medicine

## 2014-10-07 ENCOUNTER — Encounter: Payer: Self-pay | Admitting: Internal Medicine

## 2014-10-07 ENCOUNTER — Ambulatory Visit (INDEPENDENT_AMBULATORY_CARE_PROVIDER_SITE_OTHER): Payer: Medicare Other | Admitting: Internal Medicine

## 2014-10-07 VITALS — BP 136/82 | HR 54 | Temp 97.8°F | Resp 16 | Ht 68.0 in | Wt 205.0 lb

## 2014-10-07 DIAGNOSIS — Z79899 Other long term (current) drug therapy: Secondary | ICD-10-CM | POA: Diagnosis not present

## 2014-10-07 DIAGNOSIS — I10 Essential (primary) hypertension: Secondary | ICD-10-CM

## 2014-10-07 DIAGNOSIS — I25810 Atherosclerosis of coronary artery bypass graft(s) without angina pectoris: Secondary | ICD-10-CM | POA: Diagnosis not present

## 2014-10-07 DIAGNOSIS — E559 Vitamin D deficiency, unspecified: Secondary | ICD-10-CM

## 2014-10-07 DIAGNOSIS — E785 Hyperlipidemia, unspecified: Secondary | ICD-10-CM | POA: Diagnosis not present

## 2014-10-07 DIAGNOSIS — R7309 Other abnormal glucose: Secondary | ICD-10-CM | POA: Diagnosis not present

## 2014-10-07 LAB — HEPATIC FUNCTION PANEL
ALT: 15 U/L (ref 0–53)
AST: 19 U/L (ref 0–37)
Albumin: 4.3 g/dL (ref 3.5–5.2)
Alkaline Phosphatase: 63 U/L (ref 39–117)
BILIRUBIN INDIRECT: 0.4 mg/dL (ref 0.2–1.2)
Bilirubin, Direct: 0.1 mg/dL (ref 0.0–0.3)
Total Bilirubin: 0.5 mg/dL (ref 0.2–1.2)
Total Protein: 7.1 g/dL (ref 6.0–8.3)

## 2014-10-07 LAB — CBC WITH DIFFERENTIAL/PLATELET
Basophils Absolute: 0 10*3/uL (ref 0.0–0.1)
Basophils Relative: 1 % (ref 0–1)
EOS ABS: 0.1 10*3/uL (ref 0.0–0.7)
Eosinophils Relative: 2 % (ref 0–5)
HCT: 42.5 % (ref 39.0–52.0)
HEMOGLOBIN: 14.7 g/dL (ref 13.0–17.0)
LYMPHS PCT: 36 % (ref 12–46)
Lymphs Abs: 1.3 10*3/uL (ref 0.7–4.0)
MCH: 30.8 pg (ref 26.0–34.0)
MCHC: 34.6 g/dL (ref 30.0–36.0)
MCV: 88.9 fL (ref 78.0–100.0)
MONO ABS: 0.4 10*3/uL (ref 0.1–1.0)
MPV: 9.5 fL (ref 8.6–12.4)
Monocytes Relative: 11 % (ref 3–12)
Neutro Abs: 1.8 10*3/uL (ref 1.7–7.7)
Neutrophils Relative %: 50 % (ref 43–77)
Platelets: 164 10*3/uL (ref 150–400)
RBC: 4.78 MIL/uL (ref 4.22–5.81)
RDW: 13.4 % (ref 11.5–15.5)
WBC: 3.6 10*3/uL — AB (ref 4.0–10.5)

## 2014-10-07 LAB — BASIC METABOLIC PANEL WITH GFR
BUN: 16 mg/dL (ref 6–23)
CO2: 30 mEq/L (ref 19–32)
Calcium: 9.3 mg/dL (ref 8.4–10.5)
Chloride: 103 mEq/L (ref 96–112)
Creat: 0.97 mg/dL (ref 0.50–1.35)
GFR, EST NON AFRICAN AMERICAN: 81 mL/min
Glucose, Bld: 101 mg/dL — ABNORMAL HIGH (ref 70–99)
POTASSIUM: 4.4 meq/L (ref 3.5–5.3)
SODIUM: 138 meq/L (ref 135–145)

## 2014-10-07 LAB — LIPID PANEL
Cholesterol: 145 mg/dL (ref 0–200)
HDL: 39 mg/dL — ABNORMAL LOW (ref >=40)
LDL CALC: 77 mg/dL (ref 0–99)
TRIGLYCERIDES: 146 mg/dL (ref <150)
Total CHOL/HDL Ratio: 3.7 Ratio
VLDL: 29 mg/dL (ref 0–40)

## 2014-10-07 LAB — MAGNESIUM: Magnesium: 1.9 mg/dL (ref 1.5–2.5)

## 2014-10-07 LAB — HEMOGLOBIN A1C
Hgb A1c MFr Bld: 6.1 % — ABNORMAL HIGH (ref <5.7)
Mean Plasma Glucose: 128 mg/dL — ABNORMAL HIGH (ref <117)

## 2014-10-07 NOTE — Progress Notes (Signed)
Patient ID: MAANAV KASSABIAN, male   DOB: 1950/06/29, 64 y.o.   MRN: [id number]  Assessment and Plan:    1. Essential hypertension -cont meds -diet and exercise -monitor at home -DASH diet   2. Hyperlipidemia -cont crestor - Lipid panel  3. Coronary artery disease involving coronary bypass graft of native heart without angina pectoris -at guidelines for BP and also cholesterol -no issues at this time  4. Vitamin D deficiency -cont supplement - Vitamin D 1,25 dihydroxy  5. Abnormal glucose -keep snacks when working  - Hemoglobin A1c - Insulin, random  6. Medication management  - CBC with Differential/Platelet - BASIC METABOLIC PANEL WITH GFR - Hepatic function panel - Magnesium     Continue diet and meds as discussed. Further disposition pending results of labs.  HPI 64 y.o. male  presents for 3 month follow up with hypertension, hyperlipidemia, prediabetes and vitamin D.   His blood pressure has been controlled at home, today their BP is BP: 136/82 mmHg.   He does workout.  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  He denies chest pain, shortness of breath, dizziness.   He is on cholesterol medication and denies myalgias. His cholesterol is at goal. The cholesterol last visit was:   Lab Results  Component Value Date   CHOL 130 01/29/2014   HDL 43 01/29/2014   LDLCALC 59 01/29/2014   TRIG 138 01/29/2014   CHOLHDL 3.0 01/29/2014     He has been working on diet and exercise for prediabetes, and denies foot ulcerations, hyperglycemia, hypoglycemia , increased appetite, nausea, paresthesia of the feet, polydipsia, polyuria, visual disturbances, vomiting and weight loss. Last A1C in the office was:  Lab Results  Component Value Date   HGBA1C 5.9* 01/29/2014  Typical lunch is a pack of nabs and an apple.  He occasionally eats ice cream.  He has breakfast and dinner with his wife daily.  He reports that they are eating a lot baked and  grilled meats.    Patient is on Vitamin D supplement.  Lab Results  Component Value Date   VD25OH 77 09/30/2013     Patient reports that he has a lot of plantar fasciitis in his right foot.  He reports that he has been trying to use kinesio tape which has been helping.  He has had this problem in the past in his other foot.    Current Medications:  Current Outpatient Prescriptions on File Prior to Visit  Medication Sig Dispense Refill  . aspirin 81 MG tablet Take 81 mg by mouth daily.      . carbamazepine (CARBATROL) 300 MG 12 hr capsule TAKE 1 CAPSULE BY MOUTH 2 TIMES DAILY. 360 capsule 4  . Cholecalciferol (VITAMIN D PO) Take 10,000 Units by mouth daily.      . fish oil-omega-3 fatty acids 1000 MG capsule Take 1 g by mouth 2 (two) times daily.     . metoprolol succinate (TOPROL-XL) 100 MG 24 hr tablet Take 1 tablet (100 mg total) by mouth daily. For Bp 90 tablet 99  . rosuvastatin (CRESTOR) 40 MG tablet Take 1 tablet (40 mg total) by mouth daily. 90 tablet 1   No current facility-administered medications on file prior to visit.    Medical History:  Past Medical History  Diagnosis Date  . Plantar fasciitis   . Ischemic heart disease   . Seizure disorder   . Hyperlipidemia   . Dyslipidemia   .  Bilateral carpal tunnel syndrome   . Vitamin D deficiency     Allergies:  Allergies  Allergen Reactions  . Lipitor [Atorvastatin]     myalgia  . Niacin And Related Other (See Comments)    Pt unaware of this intolerance (10/31/13)     Review of Systems:  Review of Systems  Constitutional: Negative for fever, chills and malaise/fatigue.  HENT: Positive for tinnitus. Negative for congestion, ear discharge, ear pain and sore throat.   Eyes: Negative.   Respiratory: Negative for cough, shortness of breath and wheezing.   Cardiovascular: Negative for chest pain, palpitations and leg swelling.  Gastrointestinal: Negative for heartburn, nausea, vomiting, abdominal pain, diarrhea,  constipation, blood in stool and melena.  Genitourinary: Negative.   Musculoskeletal: Positive for joint pain (foot pain).  Skin: Negative.   Neurological: Negative for dizziness, sensory change, loss of consciousness and headaches.  Psychiatric/Behavioral: Negative for depression. The patient is not nervous/anxious and does not have insomnia.     Family history- Review and unchanged  Social history- Review and unchanged  Physical Exam: BP 136/82 mmHg  Pulse 54  Temp(Src) 97.8 F (36.6 C) (Temporal)  Resp 16  Ht 5\' 8"  (1.727 m)  Wt 205 lb (92.987 kg)  BMI 31.18 kg/m2 Wt Readings from Last 3 Encounters:  10/07/14 205 lb (92.987 kg)  01/29/14 204 lb (92.534 kg)  10/31/13 200 lb 6.4 oz (90.901 kg)    General Appearance: Well nourished well developed, in no apparent distress. Eyes: PERRLA, EOMs, conjunctiva no swelling or erythema ENT/Mouth: Ear canals normal without obstruction, swelling, erythma, discharge.  TMs normal bilaterally.  Oropharynx moist, clear, without exudate, or postoropharyngeal swelling. Neck: Supple, thyroid normal,no cervical adenopathy  Respiratory: Respiratory effort normal, Breath sounds clear A&P without rhonchi, wheeze, or rale.  No retractions, no accessory usage. Cardio: RRR with no MRGs. Brisk peripheral pulses without edema.  Abdomen: Soft, + BS,  Non tender, no guarding, rebound, hernias, masses. Musculoskeletal: Full ROM, 5/5 strength, Normal gait Skin: Warm, dry without rashes, lesions, ecchymosis.  Neuro: Awake and oriented X 3, Cranial nerves intact. Normal muscle tone, no cerebellar symptoms. Psych: Normal affect, Insight and Judgment appropriate.    FORCUCCI, Nicoletta Hush, PA-C 10:37 AM [facility]

## 2014-10-08 LAB — INSULIN, RANDOM: Insulin: 5.7 u[IU]/mL (ref 2.0–19.6)

## 2014-10-10 LAB — VITAMIN D 1,25 DIHYDROXY
Vitamin D 1, 25 (OH)2 Total: 49 pg/mL (ref 18–72)
Vitamin D2 1, 25 (OH)2: <8 pg/mL
Vitamin D3 1, 25 (OH)2: 49 pg/mL

## 2014-11-25 ENCOUNTER — Other Ambulatory Visit: Payer: Self-pay | Admitting: Internal Medicine

## 2015-01-22 ENCOUNTER — Encounter: Payer: Self-pay | Admitting: Internal Medicine

## 2015-02-05 ENCOUNTER — Ambulatory Visit: Payer: No Typology Code available for payment source | Admitting: Cardiology

## 2015-02-16 ENCOUNTER — Ambulatory Visit (INDEPENDENT_AMBULATORY_CARE_PROVIDER_SITE_OTHER): Payer: Medicare Other | Admitting: Nurse Practitioner

## 2015-02-16 ENCOUNTER — Encounter: Payer: Self-pay | Admitting: Nurse Practitioner

## 2015-02-16 VITALS — BP 156/80 | HR 44 | Ht 68.0 in | Wt 203.8 lb

## 2015-02-16 DIAGNOSIS — I10 Essential (primary) hypertension: Secondary | ICD-10-CM | POA: Diagnosis not present

## 2015-02-16 DIAGNOSIS — I2510 Atherosclerotic heart disease of native coronary artery without angina pectoris: Secondary | ICD-10-CM

## 2015-02-16 DIAGNOSIS — E785 Hyperlipidemia, unspecified: Secondary | ICD-10-CM | POA: Diagnosis not present

## 2015-02-16 DIAGNOSIS — I2583 Coronary atherosclerosis due to lipid rich plaque: Principal | ICD-10-CM

## 2015-02-16 NOTE — Progress Notes (Signed)
CARDIOLOGY OFFICE NOTE  Date:  02/16/2015    [name] Date of Birth: 02-17-1951 Medical Record #[id number]  PCP:  Alesia Richards, MD  Cardiologist:  Aundra Dubin    Chief Complaint  Patient presents with  . Coronary Artery Disease    16 month check - seen for Dr. Aundra Dubin    History of Present Illness: [name] is a 64 y.o. male who presents today for a 16 month check. Seen for Dr. Aundra Dubin. Former patient of Dr. Susa Simmonds. He has known CAD s/p single vessel LIMA-LAD in 4/08 per Dr. Roxy Manns. Other issues include HTN and HLD. He had an ETT-myoview in 5/13 that was a normal study.   Last seen in June of 2015 - felt to be doing well.  Labs done earlier this summer noted - borderline DM.   Comes back today. Here alone. He says he is doing ok. Remains active. Walks almost every day - does run some. No chest pain. Breathing ok. Feels ok on his medicines. Not lightheaded or dizzy. No syncope. No shortness of breath. Does not feel his A1C was accurate from May labs due to not fasting (explained that that is not the case). BP is running in the 120/70's at home.   PMH: 1. Hyperlipidemia 2. Seizure disorder 3. CAD: s/p CABG in 4/08 with off-pump LIMA-LAD. Stress echo in 2010 was normal. ETT-Myoview in 5/13 with EF 56%, no ischemia or infarction.    Past Medical History  Diagnosis Date  . Plantar fasciitis   . Ischemic heart disease   . Seizure disorder   . Hyperlipidemia   . Dyslipidemia   . Bilateral carpal tunnel syndrome   . Vitamin D deficiency     Past Surgical History  Procedure Laterality Date  . Coronary artery bypass graft  08/2006  . Cholecystectomy    . Hand surgery       Medications: Current Outpatient Prescriptions  Medication Sig Dispense Refill  . aspirin 81 MG tablet Take 81 mg by mouth daily.      . carbamazepine (CARBATROL) 300 MG 12 hr capsule TAKE 1 CAPSULE BY MOUTH 2 TIMES DAILY. 360 capsule 4  . Cholecalciferol (VITAMIN D PO) Take  10,000 Units by mouth daily.      . fish oil-omega-3 fatty acids 1000 MG capsule Take 1 g by mouth 2 (two) times daily.     . LUTEIN PO Take by mouth daily.    . Magnesium 250 MG TABS Take 250 mg by mouth daily.    . metoprolol succinate (TOPROL-XL) 100 MG 24 hr tablet TAKE 1 TABLET BY MOUTH ONCE DAILY FOR BLOOD PRESSURE 90 tablet PRN  . rosuvastatin (CRESTOR) 40 MG tablet Take 1 tablet (40 mg total) by mouth daily. 90 tablet 1   No current facility-administered medications for this visit.    Allergies: Allergies  Allergen Reactions  . Lipitor [Atorvastatin]     myalgia  . Niacin And Related Other (See Comments)    Pt unaware of this intolerance (10/31/13)    Social History: The patient  reports that he quit smoking about 46 years ago. His smoking use included Cigarettes. He has never used smokeless tobacco. He reports that he does not drink alcohol or use illicit drugs.   Family History: The patient's family history includes Diabetes in his father; Hypertension in his mother; Lupus in his sister; Stroke in his mother.   Review of Systems: Please see the history of present illness.   Otherwise,  the review of systems is positive for none.   All other systems are reviewed and negative.   Physical Exam: VS:  BP 156/80 mmHg  Pulse 44  Ht 5\' 8"  (1.727 m)  Wt 203 lb 12.8 oz (92.443 kg)  BMI 30.99 kg/m2 .  BMI Body mass index is 30.99 kg/(m^2).  Wt Readings from Last 3 Encounters:  02/16/15 203 lb 12.8 oz (92.443 kg)  10/07/14 205 lb (92.987 kg)  01/29/14 204 lb (92.534 kg)   Repeat BP was 150/90 by me.   General: Pleasant. Well developed, well nourished and in no acute distress.  HEENT: Normal. Neck: Supple, no JVD, carotid bruits, or masses noted.  Cardiac: Regular rate and rhythm. His rate is 52 by apical count. No murmurs, rubs, or gallops. No edema.  Respiratory:  Lungs are clear to auscultation bilaterally with normal work of breathing.  GI: Soft and nontender.  MS: No  deformity or atrophy. Gait and ROM intact. Skin: Warm and dry. Color is normal.  Neuro:  Strength and sensation are intact and no gross focal deficits noted.  Psych: Alert, appropriate and with normal affect.   LABORATORY DATA:  EKG:  EKG is ordered today. This demonstrates marked bradycardia.  Lab Results  Component Value Date   WBC 3.6* 10/07/2014   HGB 14.7 10/07/2014   HCT 42.5 10/07/2014   PLT 164 10/07/2014   GLUCOSE 101* 10/07/2014   CHOL 145 10/07/2014   TRIG 146 10/07/2014   HDL 39* 10/07/2014   LDLCALC 77 10/07/2014   ALT 15 10/07/2014   AST 19 10/07/2014   NA 138 10/07/2014   K 4.4 10/07/2014   CL 103 10/07/2014   CREATININE 0.97 10/07/2014   BUN 16 10/07/2014   CO2 30 10/07/2014   TSH 3.964 01/29/2014   PSA 0.44 09/30/2013   HGBA1C 6.1* 10/07/2014   MICROALBUR 0.50 09/30/2013    BNP (last 3 results) No results for input(s): BNP in the last 8760 hours.  ProBNP (last 3 results) No results for input(s): PROBNP in the last 8760 hours.   Other Studies Reviewed Today:   Assessment/Plan: 1. CAD: No ischemic symptoms. Negative stress myoview in 5/13. Continue ASA 81, statin, and Toprol XL. Continue with CV risk factor modification.   2. Hyperlipidemia: on statin therapy - labs checked by PCP. He has not tolerated Lipitor in the past. He is complaining of cost with Crestor but he has had nice response. I would prefer he continue Crestor.   3. HTN - better control at home. He monitors regularly.   4. Borderline DM - encouraged regular activity, dietary restrictions and weight control.  5. Bradycardia - he has prior EKG with rate of 49 noted. Apical pulse higher on exam. He is totally asymptomatic - I have left him on his current regimen. He is to call us for any symptoms.    Current medicines are reviewed with the patient today.  The patient does not have concerns regarding medicines other than what has been noted above.  The following changes have been  made:  See above.  Labs/ tests ordered today include:   No orders of the defined types were placed in this encounter.     Disposition:   FU with Dr. Aundra Dubin in one year.   Patient is agreeable to this plan and will call if any problems develop in the interim.   Signed: Burtis Junes, RN, ANP-C 02/16/2015 11:04 AM  [facility] [address]  [place], [place]  [address] Phone: [phone] Fax: [phone]

## 2015-02-16 NOTE — Patient Instructions (Addendum)
We will be checking the following labs today - NONE   Medication Instructions:    Continue with your current medicines.     Testing/Procedures To Be Arranged:  N/A  Follow-Up:   See [place] back in one year.     Other Special Instructions:   N/A  Call the [place] office at [phone] if you have any questions, problems or concerns.

## 2015-02-23 ENCOUNTER — Encounter: Payer: Self-pay | Admitting: Internal Medicine

## 2015-02-23 ENCOUNTER — Ambulatory Visit (INDEPENDENT_AMBULATORY_CARE_PROVIDER_SITE_OTHER): Payer: Medicare Other | Admitting: Internal Medicine

## 2015-02-23 VITALS — BP 138/80 | HR 52 | Temp 97.5°F | Resp 16 | Ht 67.5 in | Wt 202.2 lb

## 2015-02-23 DIAGNOSIS — E559 Vitamin D deficiency, unspecified: Secondary | ICD-10-CM | POA: Diagnosis not present

## 2015-02-23 DIAGNOSIS — Z0000 Encounter for general adult medical examination without abnormal findings: Secondary | ICD-10-CM | POA: Insufficient documentation

## 2015-02-23 DIAGNOSIS — I2510 Atherosclerotic heart disease of native coronary artery without angina pectoris: Secondary | ICD-10-CM

## 2015-02-23 DIAGNOSIS — I10 Essential (primary) hypertension: Secondary | ICD-10-CM

## 2015-02-23 DIAGNOSIS — Z1389 Encounter for screening for other disorder: Secondary | ICD-10-CM

## 2015-02-23 DIAGNOSIS — N320 Bladder-neck obstruction: Secondary | ICD-10-CM

## 2015-02-23 DIAGNOSIS — E785 Hyperlipidemia, unspecified: Secondary | ICD-10-CM | POA: Diagnosis not present

## 2015-02-23 DIAGNOSIS — R7303 Prediabetes: Secondary | ICD-10-CM | POA: Diagnosis not present

## 2015-02-23 DIAGNOSIS — I152 Hypertension secondary to endocrine disorders: Secondary | ICD-10-CM

## 2015-02-23 DIAGNOSIS — Z23 Encounter for immunization: Secondary | ICD-10-CM | POA: Diagnosis not present

## 2015-02-23 DIAGNOSIS — Z79899 Other long term (current) drug therapy: Secondary | ICD-10-CM | POA: Diagnosis not present

## 2015-02-23 DIAGNOSIS — R7309 Other abnormal glucose: Secondary | ICD-10-CM | POA: Insufficient documentation

## 2015-02-23 DIAGNOSIS — Z789 Other specified health status: Secondary | ICD-10-CM | POA: Diagnosis not present

## 2015-02-23 DIAGNOSIS — G40909 Epilepsy, unspecified, not intractable, without status epilepticus: Secondary | ICD-10-CM

## 2015-02-23 DIAGNOSIS — Z125 Encounter for screening for malignant neoplasm of prostate: Secondary | ICD-10-CM

## 2015-02-23 DIAGNOSIS — Z0001 Encounter for general adult medical examination with abnormal findings: Secondary | ICD-10-CM | POA: Diagnosis not present

## 2015-02-23 DIAGNOSIS — Z6831 Body mass index (BMI) 31.0-31.9, adult: Secondary | ICD-10-CM

## 2015-02-23 DIAGNOSIS — R6889 Other general symptoms and signs: Secondary | ICD-10-CM

## 2015-02-23 DIAGNOSIS — E1159 Type 2 diabetes mellitus with other circulatory complications: Secondary | ICD-10-CM

## 2015-02-23 DIAGNOSIS — Z9181 History of falling: Secondary | ICD-10-CM

## 2015-02-23 DIAGNOSIS — Z1331 Encounter for screening for depression: Secondary | ICD-10-CM

## 2015-02-23 DIAGNOSIS — Z1212 Encounter for screening for malignant neoplasm of rectum: Secondary | ICD-10-CM

## 2015-02-23 LAB — CBC WITH DIFFERENTIAL/PLATELET
BASOS ABS: 0 10*3/uL (ref 0.0–0.1)
Basophils Relative: 1 % (ref 0–1)
Eosinophils Absolute: 0 10*3/uL (ref 0.0–0.7)
Eosinophils Relative: 1 % (ref 0–5)
HCT: 42.3 % (ref 39.0–52.0)
Hemoglobin: 14.9 g/dL (ref 13.0–17.0)
LYMPHS PCT: 30 % (ref 12–46)
Lymphs Abs: 1.3 10*3/uL (ref 0.7–4.0)
MCH: 31.1 pg (ref 26.0–34.0)
MCHC: 35.2 g/dL (ref 30.0–36.0)
MCV: 88.3 fL (ref 78.0–100.0)
MPV: 10 fL (ref 8.6–12.4)
Monocytes Absolute: 0.4 10*3/uL (ref 0.1–1.0)
Monocytes Relative: 9 % (ref 3–12)
NEUTROS PCT: 59 % (ref 43–77)
Neutro Abs: 2.5 10*3/uL (ref 1.7–7.7)
Platelets: 166 10*3/uL (ref 150–400)
RBC: 4.79 MIL/uL (ref 4.22–5.81)
RDW: 13.3 % (ref 11.5–15.5)
WBC: 4.3 10*3/uL (ref 4.0–10.5)

## 2015-02-23 LAB — LIPID PANEL
CHOL/HDL RATIO: 3.4 ratio (ref <=5.0)
Cholesterol: 136 mg/dL (ref 125–200)
HDL: 40 mg/dL (ref >=40)
LDL CALC: 61 mg/dL (ref <130)
Triglycerides: 175 mg/dL — ABNORMAL HIGH (ref <150)
VLDL: 35 mg/dL — ABNORMAL HIGH (ref <30)

## 2015-02-23 LAB — BASIC METABOLIC PANEL WITH GFR
BUN: 16 mg/dL (ref 7–25)
CHLORIDE: 104 mmol/L (ref 98–110)
CO2: 29 mmol/L (ref 20–31)
Calcium: 9.1 mg/dL (ref 8.6–10.3)
Creat: 1 mg/dL (ref 0.70–1.25)
GFR, EST NON AFRICAN AMERICAN: 78 mL/min (ref >=60)
GFR, Est African American: >89 mL/min (ref >=60)
GLUCOSE: 95 mg/dL (ref 65–99)
POTASSIUM: 4.4 mmol/L (ref 3.5–5.3)
Sodium: 137 mmol/L (ref 135–146)

## 2015-02-23 LAB — HEPATIC FUNCTION PANEL
ALBUMIN: 4.3 g/dL (ref 3.6–5.1)
ALT: 16 U/L (ref 9–46)
AST: 18 U/L (ref 10–35)
Alkaline Phosphatase: 61 U/L (ref 40–115)
BILIRUBIN INDIRECT: 0.3 mg/dL (ref 0.2–1.2)
Bilirubin, Direct: 0.1 mg/dL (ref <=0.2)
TOTAL PROTEIN: 6.8 g/dL (ref 6.1–8.1)
Total Bilirubin: 0.4 mg/dL (ref 0.2–1.2)

## 2015-02-23 LAB — MAGNESIUM: Magnesium: 1.9 mg/dL (ref 1.5–2.5)

## 2015-02-23 LAB — TSH: TSH: 2.941 u[IU]/mL (ref 0.350–4.500)

## 2015-02-23 NOTE — Patient Instructions (Signed)
Recommend Adult Low dose Aspirin or   coated  Aspirin 81 mg daily   To reduce risk of Colon Cancer 20 %,   Skin Cancer 26 % ,   Melanoma 46%   and   Pancreatic cancer 60%  ++++++++++++++++++  Vitamin D goal   is between 70-100.   Please make sure that you are taking your Vitamin D as directed.   It is very important as a natural anti-inflammatory   helping hair, skin, and nails, as well as reducing stroke and heart attack risk.   It helps your bones and helps with mood.  It also decreases numerous cancer risks so please take it as directed.   Low Vit D is associated with a 200-300% higher risk for CANCER   and 200-300% higher risk for HEART   ATTACK  &  STROKE.   .....................................[name]  It is also associated with higher death rate at younger ages,   autoimmune diseases like Rheumatoid arthritis, Lupus, Multiple Sclerosis.     Also many other serious conditions, like depression, Alzheimer's  Dementia, infertility, muscle aches, fatigue, fibromyalgia - just to name a few.  +++++++++++++++++++  Recommend the book "The END of DIETING" by Dr Excell Seltzer   & the book "The END of DIABETES " by Dr Excell Seltzer  At [email or web address] - get book & Audio CD's     Being diabetic has a  300% increased risk for heart attack, stroke, cancer, and alzheimer- type vascular dementia. It is very important that you work harder with diet by avoiding all foods that are white. Avoid white rice (brown & wild rice is OK), white potatoes (sweetpotatoes in moderation is OK), White bread or wheat bread or anything made out of white flour like bagels, donuts, rolls, buns, biscuits, cakes, pastries, cookies, pizza crust, and pasta (made from white flour & egg whites) - vegetarian pasta or spinach or wheat pasta is OK. Multigrain breads like Arnold's or [employer], or multigrain sandwich thins or flatbreads.  Diet, exercise and weight loss can reverse and cure diabetes in the early  stages.  Diet, exercise and weight loss is very important in the control and prevention of complications of diabetes which affects every system in your body, ie. Brain - dementia/stroke, eyes - glaucoma/blindness, heart - heart attack/heart failure, kidneys - dialysis, stomach - gastric paralysis, intestines - malabsorption, nerves - severe painful neuritis, circulation - gangrene & loss of a leg(s), and finally cancer and Alzheimers.    I recommend avoid fried & greasy foods,  sweets/candy, white rice (brown or wild rice or Quinoa is OK), white potatoes (sweet potatoes are OK) - anything made from white flour - bagels, doughnuts, rolls, buns, biscuits,white and wheat breads, pizza crust and traditional pasta made of white flour & egg white(vegetarian pasta or spinach or wheat pasta is OK).  Multi-grain bread is OK - like multi-grain flat bread or sandwich thins. Avoid alcohol in excess. Exercise is also important.    Eat all the vegetables you want - avoid meat, especially red meat and dairy - especially cheese.  Cheese is the most concentrated form of trans-fats which is the worst thing to clog up our arteries. Veggie cheese is OK which can be found in the fresh produce section at [facility] or [employer] or [employer]  ++++++++++++++++++++++++++  Preventive Care for Adults A healthy lifestyle and preventive care can promote health and wellness. Preventive health guidelines for men include the following key practices:  A routine yearly physical  is a good way to check with your health care provider about your health and preventative screening. It is a chance to share any concerns and updates on your health and to receive a thorough exam.  Visit your dentist for a routine exam and preventative care every 6 months. Brush your teeth twice a day and floss once a day. Good oral hygiene prevents tooth decay and gum disease.  The frequency of eye exams is based on your age, health, family medical  history, use of contact lenses, and other factors. Follow your health care provider's recommendations for frequency of eye exams.  Eat a healthy diet. Foods such as vegetables, fruits, whole grains, low-fat dairy products, and lean protein foods contain the nutrients you need without too many calories. Decrease your intake of foods high in solid fats, added sugars, and salt. Eat the right amount of calories for you.Get information about a proper diet from your health care provider, if necessary.  Regular physical exercise is one of the most important things you can do for your health. Most adults should get at least 150 minutes of moderate-intensity exercise (any activity that increases your heart rate and causes you to sweat) each week. In addition, most adults need muscle-strengthening exercises on 2 or more days a week.  Maintain a healthy weight. The body mass index (BMI) is a screening tool to identify possible weight problems. It provides an estimate of body fat based on height and weight. Your health care provider can find your BMI and can help you achieve or maintain a healthy weight.For adults 20 years and older:  A BMI below 18.5 is considered underweight.  A BMI of 18.5 to 24.9 is normal.  A BMI of 25 to 29.9 is considered overweight.  A BMI of 30 and above is considered obese.  Maintain normal blood lipids and cholesterol levels by exercising and minimizing your intake of saturated fat. Eat a balanced diet with plenty of fruit and vegetables. Blood tests for lipids and cholesterol should begin at age 31 and be repeated every 5 years. If your lipid or cholesterol levels are high, you are over 50, or you are at high risk for heart disease, you may need your cholesterol levels checked more frequently.Ongoing high lipid and cholesterol levels should be treated with medicines if diet and exercise are not working.  If you smoke, find out from your health care provider how to quit. If you  do not use tobacco, do not start.  Lung cancer screening is recommended for adults aged 11-80 years who are at high risk for developing lung cancer because of a history of smoking. A yearly low-dose CT scan of the lungs is recommended for people who have at least a 30-pack-year history of smoking and are a current smoker or have quit within the past 15 years. A pack year of smoking is smoking an average of 1 pack of cigarettes a day for 1 year (for example: 1 pack a day for 30 years or 2 packs a day for 15 years). Yearly screening should continue until the smoker has stopped smoking for at least 15 years. Yearly screening should be stopped for people who develop a health problem that would prevent them from having lung cancer treatment.  If you choose to drink alcohol, do not have more than 2 drinks per day. One drink is considered to be 12 ounces (355 mL) of beer, 5 ounces (148 mL) of wine, or 1.5 ounces (44 mL) of liquor.  Do not share needles with anyone. Ask for help if you need support or instructions about stopping the use of drugs.  High blood pressure causes heart disease and increases the risk of stroke. Your blood pressure should be checked at least every 1-2 years. Ongoing high blood pressure should be treated with medicines, if weight loss and exercise are not effective.  If you are 45-79 years old, ask your health care provider if you should take aspirin to prevent heart disease.  Diabetes screening involves taking a blood sample to check your fasting blood sugar level. This should be done once every 3 years, after age 45, if you are within normal weight and without risk factors for diabetes. Testing should be considered at a younger age or be carried out more frequently if you are overweight and have at least 1 risk factor for diabetes.  Colorectal cancer can be detected and often prevented. Most routine colorectal cancer screening begins at the age of 50 and continues  through age 75. However, your health care provider may recommend screening at an earlier age if you have risk factors for colon cancer. On a yearly basis, your health care provider may provide home test kits to check for hidden blood in the stool. Use of a small camera at the end of a tube to directly examine the colon (sigmoidoscopy or colonoscopy) can detect the earliest forms of colorectal cancer. Talk to your health care provider about this at age 50, when routine screening begins. Direct exam of the colon should be repeated every 5-10 years through age 75, unless early forms of precancerous polyps or small growths are found.  People who are at an increased risk for hepatitis B should be screened for this virus. You are considered at high risk for hepatitis B if:  You were born in a country where hepatitis B occurs often. Talk with your health care provider about which countries are considered high risk.  Your parents were born in a high-risk country and you have not received a shot to protect against hepatitis B (hepatitis B vaccine).  You have HIV or AIDS.  You use needles to inject street drugs.  You live with, or have sex with, someone who has hepatitis B.  You are a man who has sex with other men (MSM).  You get hemodialysis treatment.  You take certain medicines for conditions such as cancer, organ transplantation, and autoimmune conditions.  Hepatitis C blood testing is recommended for all people born from 1945 through 1965 and any individual with known risks for hepatitis C.  Practice safe sex. Use condoms and avoid high-risk sexual practices to reduce the spread of sexually transmitted infections (STIs). STIs include gonorrhea, chlamydia, syphilis, trichomonas, herpes, HPV, and human immunodeficiency virus (HIV). Herpes, HIV, and HPV are viral illnesses that have no cure. They can result in disability, cancer, and death.  If you are at risk of being infected with HIV, it is  recommended that you take a prescription medicine daily to prevent HIV infection. This is called preexposure prophylaxis (PrEP). You are considered at risk if:  You are a man who has sex with other men (MSM) and have other risk factors.  You are a heterosexual man, are sexually active, and are at increased risk for HIV infection.  You take drugs by injection.  You are sexually active with a partner who has HIV.  Talk with your health care provider about whether you are at high risk of being infected with   HIV. If you choose to begin PrEP, you should first be tested for HIV. You should then be tested every 3 months for as long as you are taking PrEP.  A one-time screening for abdominal aortic aneurysm (AAA) and surgical repair of large AAAs by ultrasound are recommended for men ages 65 to 75 years who are current or former smokers.  Healthy men should no longer receive prostate-specific antigen (PSA) blood tests as part of routine cancer screening. Talk with your health care provider about prostate cancer screening.  Testicular cancer screening is not recommended for adult males who have no symptoms. Screening includes self-exam, a health care provider exam, and other screening tests. Consult with your health care provider about any symptoms you have or any concerns you have about testicular cancer.  Use sunscreen. Apply sunscreen liberally and repeatedly throughout the day. You should seek shade when your shadow is shorter than you. Protect yourself by wearing long sleeves, pants, a wide-brimmed hat, and sunglasses year round, whenever you are outdoors.  Once a month, do a whole-body skin exam, using a mirror to look at the skin on your back. Tell your health care provider about new moles, moles that have irregular borders, moles that are larger than a pencil eraser, or moles that have changed in shape or color.  Stay current with required vaccines (immunizations).  Influenza vaccine. All  adults should be immunized every year.  Tetanus, diphtheria, and acellular pertussis (Td, Tdap) vaccine. An adult who has not previously received Tdap or who does not know his vaccine status should receive 1 dose of Tdap. This initial dose should be followed by tetanus and diphtheria toxoids (Td) booster doses every 10 years. Adults with an unknown or incomplete history of completing a 3-dose immunization series with Td-containing vaccines should begin or complete a primary immunization series including a Tdap dose. Adults should receive a Td booster every 10 years.  Varicella vaccine. An adult without evidence of immunity to varicella should receive 2 doses or a second dose if he has previously received 1 dose.  Human papillomavirus (HPV) vaccine. Males aged 13-21 years who have not received the vaccine previously should receive the 3-dose series. Males aged 22-26 years may be immunized. Immunization is recommended through the age of 26 years for any male who has sex with males and did not get any or all doses earlier. Immunization is recommended for any person with an immunocompromised condition through the age of 26 years if he did not get any or all doses earlier. During the 3-dose series, the second dose should be obtained 4-8 weeks after the first dose. The third dose should be obtained 24 weeks after the first dose and 16 weeks after the second dose.  Zoster vaccine. One dose is recommended for adults aged 60 years or older unless certain conditions are present.  Measles, mumps, and rubella (MMR) vaccine. Adults born before 1957 generally are considered immune to measles and mumps. Adults born in 1957 or later should have 1 or more doses of MMR vaccine unless there is a contraindication to the vaccine or there is laboratory evidence of immunity to each of the three diseases. A routine second dose of MMR vaccine should be obtained at least 28 days after the first dose for students attending  postsecondary schools, health care workers, or international travelers. People who received inactivated measles vaccine or an unknown type of measles vaccine during 1963-1967 should receive 2 doses of MMR vaccine. People who received inactivated mumps   People who received inactivated mumps vaccine or an unknown type of mumps vaccine before 1979 and are at high risk for mumps infection should consider immunization with 2 doses of MMR vaccine. Unvaccinated health care workers born before [birth year] who lack laboratory evidence of measles, mumps, or rubella immunity or laboratory confirmation of disease should consider measles and mumps immunization with 2 doses of MMR vaccine or rubella immunization with 1 dose of MMR vaccine.  Pneumococcal 13-valent conjugate (PCV13) vaccine. When indicated, a person who is uncertain of his immunization history and has no record of immunization should receive the PCV13 vaccine. An adult aged 67 years or older who has certain medical conditions and has not been previously immunized should receive 1 dose of PCV13 vaccine. This PCV13 should be followed with a dose of pneumococcal polysaccharide (PPSV23) vaccine. The PPSV23 vaccine dose should be obtained at least 8 weeks after the dose of PCV13 vaccine. An adult aged 78 years or older who has certain medical conditions and previously received 1 or more doses of PPSV23 vaccine should receive 1 dose of PCV13. The PCV13 vaccine dose should be obtained 1 or more years after the last PPSV23 vaccine dose.  Pneumococcal polysaccharide (PPSV23) vaccine. When PCV13 is also indicated, PCV13 should be obtained first. All adults aged 69 years and older should be immunized. An adult younger than age 71 years who has certain medical conditions should be immunized. Any person who resides in a nursing home or long-term care facility should be immunized. An adult smoker should be immunized. People with an immunocompromised condition and certain other conditions should receive both PCV13 and  PPSV23 vaccines. People with human immunodeficiency virus (HIV) infection should be immunized as soon as possible after diagnosis. Immunization during chemotherapy or radiation therapy should be avoided. Routine use of PPSV23 vaccine is not recommended for American Indians, [place] Natives, or people younger than 65 years unless there are medical conditions that require PPSV23 vaccine. When indicated, people who have unknown immunization and have no record of immunization should receive PPSV23 vaccine. One-time revaccination 5 years after the first dose of PPSV23 is recommended for people aged 19-64 years who have chronic kidney failure, nephrotic syndrome, asplenia, or immunocompromised conditions. People who received 1-2 doses of PPSV23 before age 41 years should receive another dose of PPSV23 vaccine at age 1 years or later if at least 5 years have passed since the previous dose. Doses of PPSV23 are not needed for people immunized with PPSV23 at or after age 44 years.  Meningococcal vaccine. Adults with asplenia or persistent complement component deficiencies should receive 2 doses of quadrivalent meningococcal conjugate (MenACWY-D) vaccine. The doses should be obtained at least 2 months apart. Microbiologists working with certain meningococcal bacteria, [place] recruits, people at risk during an outbreak, and people who travel to or live in countries with a high rate of meningitis should be immunized. A first-year college student up through age 62 years who is living in a residence hall should receive a dose if he did not receive a dose on or after his 16th birthday. Adults who have certain high-risk conditions should receive one or more doses of vaccine.  Hepatitis A vaccine. Adults who wish to be protected from this disease, have certain high-risk conditions, work with hepatitis A-infected animals, work in hepatitis A research labs, or travel to or work in countries with a high rate of hepatitis A should  be immunized. Adults who were previously unvaccinated and who anticipate close contact with an international  adoptee during the first 60 days after arrival in the [place] from a country with a high rate of hepatitis A should be immunized.  Hepatitis B vaccine. Adults should be immunized if they wish to be protected from this disease, have certain high-risk conditions, may be exposed to blood or other infectious body fluids, are household contacts or sex partners of hepatitis B positive people, are clients or workers in certain care facilities, or travel to or work in countries with a high rate of hepatitis B.  Haemophilus influenzae type b (Hib) vaccine. A previously unvaccinated person with asplenia or sickle cell disease or having a scheduled splenectomy should receive 1 dose of Hib vaccine. Regardless of previous immunization, a recipient of a hematopoietic stem cell transplant should receive a 3-dose series 6-12 months after his successful transplant. Hib vaccine is not recommended for adults with HIV infection. Preventive Service / Frequency Ages 94 to 20  Blood pressure check.** / Every 1 to 2 years.  Lipid and cholesterol check.** / Every 5 years beginning at age 55.  Hepatitis C blood test.** / For any individual with known risks for hepatitis C.  Skin self-exam. / Monthly.  Influenza vaccine. / Every year.  Tetanus, diphtheria, and acellular pertussis (Tdap, Td) vaccine.** / Consult your health care provider. 1 dose of Td every 10 years.  Varicella vaccine.** / Consult your health care provider.  HPV vaccine. / 3 doses over 6 months, if 74 or younger.  Measles, mumps, rubella (MMR) vaccine.** / You need at least 1 dose of MMR if you were born in 1957 or later. You may also need a second dose.  Pneumococcal 13-valent conjugate (PCV13) vaccine.** / Consult your health care provider.  Pneumococcal polysaccharide (PPSV23) vaccine.** / 1 to 2 doses if you smoke cigarettes or if  you have certain conditions.  Meningococcal vaccine.** / 1 dose if you are age 71 to 33 years and a Market researcher living in a residence hall, or have one of several medical conditions. You may also need additional booster doses.  Hepatitis A vaccine.** / Consult your health care provider.  Hepatitis B vaccine.** / Consult your health care provider.  Haemophilus influenzae type b (Hib) vaccine.** / Consult your health care provider. Ages 37 to 50  Blood pressure check.** / Every 1 to 2 years.  Lipid and cholesterol check.** / Every 5 years beginning at age 3.  Lung cancer screening. / Every year if you are aged 75-80 years and have a 30-pack-year history of smoking and currently smoke or have quit within the past 15 years. Yearly screening is stopped once you have quit smoking for at least 15 years or develop a health problem that would prevent you from having lung cancer treatment.  Fecal occult blood test (FOBT) of stool. / Every year beginning at age 66 and continuing until age 104. You may not have to do this test if you get a colonoscopy every 10 years.  Flexible sigmoidoscopy** or colonoscopy.** / Every 5 years for a flexible sigmoidoscopy or every 10 years for a colonoscopy beginning at age 73 and continuing until age 7.  Hepatitis C blood test.** / For all people born from [birth year] through 1965 and any individual with known risks for hepatitis C.  Skin self-exam. / Monthly.  Influenza vaccine. / Every year.  Tetanus, diphtheria, and acellular pertussis (Tdap/Td) vaccine.** / Consult your health care provider. 1 dose of Td every 10 years.  Varicella vaccine.** / Consult your health care  provider.  Zoster vaccine.** / 1 dose for adults aged 31 years or older.  Measles, mumps, rubella (MMR) vaccine.** / You need at least 1 dose of MMR if you were born in 1957 or later. You may also need a second dose.  Pneumococcal 13-valent conjugate (PCV13) vaccine.** / Consult your  health care provider.  Pneumococcal polysaccharide (PPSV23) vaccine.** / 1 to 2 doses if you smoke cigarettes or if you have certain conditions.  Meningococcal vaccine.** / Consult your health care provider.  Hepatitis A vaccine.** / Consult your health care provider.  Hepatitis B vaccine.** / Consult your health care provider.  Haemophilus influenzae type b (Hib) vaccine.** / Consult your health care provider. Ages 41 and over  Blood pressure check.** / Every 1 to 2 years.  Lipid and cholesterol check.**/ Every 5 years beginning at age 2.  Lung cancer screening. / Every year if you are aged 38-80 years and have a 30-pack-year history of smoking and currently smoke or have quit within the past 15 years. Yearly screening is stopped once you have quit smoking for at least 15 years or develop a health problem that would prevent you from having lung cancer treatment.  Fecal occult blood test (FOBT) of stool. / Every year beginning at age 78 and continuing until age 83. You may not have to do this test if you get a colonoscopy every 10 years.  Flexible sigmoidoscopy** or colonoscopy.** / Every 5 years for a flexible sigmoidoscopy or every 10 years for a colonoscopy beginning at age 39 and continuing until age 59.  Hepatitis C blood test.** / For all people born from [birth year] through 1965 and any individual with known risks for hepatitis C.  Abdominal aortic aneurysm (AAA) screening./ Screening current or former smokers or have Hypertension.  Skin self-exam. / Monthly.  Influenza vaccine. / Every year.  Tetanus, diphtheria, and acellular pertussis (Tdap/Td) vaccine.** / 1 dose of Td every 10 years.  Varicella vaccine.** / Consult your health care provider.  Zoster vaccine.** / 1 dose for adults aged 74 years or older.  Pneumococcal 13-valent conjugate (PCV13) vaccine.** / Consult your health care provider.  Pneumococcal polysaccharide (PPSV23) vaccine.** / 1 dose for all adults aged 38  years and older.  Meningococcal vaccine.** / Consult your health care provider.  Hepatitis A vaccine.** / Consult your health care provider.  Hepatitis B vaccine.** / Consult your health care provider.  Haemophilus influenzae type b (Hib) vaccine.** / Consult your health care provider.  Health Maintenance A healthy lifestyle and preventative care can promote health and wellness. Maintain regular health, dental, and eye exams. Eat a healthy diet. Foods like vegetables, fruits, whole grains, low-fat dairy products, and lean protein foods contain the nutrients you need and are low in calories. Decrease your intake of foods high in solid fats, added sugars, and salt. Get information about a proper diet from your health care provider, if necessary. Regular physical exercise is one of the most important things you can do for your health. Most adults should get at least 150 minutes of moderate-intensity exercise (any activity that increases your heart rate and causes you to sweat) each week. In addition, most adults need muscle-strengthening exercises on 2 or more days a week.  Maintain a healthy weight. The body mass index (BMI) is a screening tool to identify possible weight problems. It provides an estimate of body fat based on height and weight. Your health care provider can find your BMI and can help you achieve or  maintain a healthy weight. For males 20 years and older: A BMI below 18.5 is considered underweight. A BMI of 18.5 to 24.9 is normal. A BMI of 25 to 29.9 is considered overweight. A BMI of 30 and above is considered obese. Maintain normal blood lipids and cholesterol by exercising and minimizing your intake of saturated fat. Eat a balanced diet with plenty of fruits and vegetables. Blood tests for lipids and cholesterol should begin at age 50 and be repeated every 5 years. If your lipid or cholesterol levels are high, you are over age 22, or you are at high risk for heart disease, you  may need your cholesterol levels checked more frequently.Ongoing high lipid and cholesterol levels should be treated with medicines if diet and exercise are not working. If you smoke, find out from your health care provider how to quit. If you do not use tobacco, do not start. Lung cancer screening is recommended for adults aged 28-80 years who are at high risk for developing lung cancer because of a history of smoking. A yearly low-dose CT scan of the lungs is recommended for people who have at least a 30-pack-year history of smoking and are current smokers or have quit within the past 15 years. A pack year of smoking is smoking an average of 1 pack of cigarettes a day for 1 year (for example, a 30-pack-year history of smoking could mean smoking 1 pack a day for 30 years or 2 packs a day for 15 years). Yearly screening should continue until the smoker has stopped smoking for at least 15 years. Yearly screening should be stopped for people who develop a health problem that would prevent them from having lung cancer treatment. If you choose to drink alcohol, do not have more than 2 drinks per day. One drink is considered to be 12 oz (360 mL) of beer, 5 oz (150 mL) of wine, or 1.5 oz (45 mL) of liquor. Avoid the use of street drugs. Do not share needles with anyone. Ask for help if you need support or instructions about stopping the use of drugs. High blood pressure causes heart disease and increases the risk of stroke. Blood pressure should be checked at least every 1-2 years. Ongoing high blood pressure should be treated with medicines if weight loss and exercise are not effective. If you are 13-34 years old, ask your health care provider if you should take aspirin to prevent heart disease. Diabetes screening involves taking a blood sample to check your fasting blood sugar level. This should be done once every 3 years after age 64 if you are at a normal weight and without risk factors for diabetes. Testing  should be considered at a younger age or be carried out more frequently if you are overweight and have at least 1 risk factor for diabetes. Colorectal cancer can be detected and often prevented. Most routine colorectal cancer screening begins at the age of 25 and continues through age 78. However, your health care provider may recommend screening at an earlier age if you have risk factors for colon cancer. On a yearly basis, your health care provider may provide home test kits to check for hidden blood in the stool. A small camera at the end of a tube may be used to directly examine the colon (sigmoidoscopy or colonoscopy) to detect the earliest forms of colorectal cancer. Talk to your health care provider about this at age 41 when routine screening begins. A direct exam of the colon  should be repeated every 5-10 years through age 32, unless early forms of precancerous polyps or small growths are found. People who are at an increased risk for hepatitis B should be screened for this virus. You are considered at high risk for hepatitis B if: You were born in a country where hepatitis B occurs often. Talk with your health care provider about which countries are considered high risk. Your parents were born in a high-risk country and you have not received a shot to protect against hepatitis B (hepatitis B vaccine). You have HIV or AIDS. You use needles to inject street drugs. You live with, or have sex with, someone who has hepatitis B. You are a man who has sex with other men (MSM). You get hemodialysis treatment. You take certain medicines for conditions like cancer, organ transplantation, and autoimmune conditions. Hepatitis C blood testing is recommended for all people born from [birth year] through 1967 and any individual with known risk factors for hepatitis C. Healthy men should no longer receive prostate-specific antigen (PSA) blood tests as part of routine cancer screening. Talk to your health care provider  about prostate cancer screening. Testicular cancer screening is not recommended for adolescents or adult males who have no symptoms. Screening includes self-exam, a health care provider exam, and other screening tests. Consult with your health care provider about any symptoms you have or any concerns you have about testicular cancer. Practice safe sex. Use condoms and avoid high-risk sexual practices to reduce the spread of sexually transmitted infections (STIs). You should be screened for STIs, including gonorrhea and chlamydia if: You are sexually active and are younger than 24 years. You are older than 24 years, and your health care provider tells you that you are at risk for this type of infection. Your sexual activity has changed since you were last screened, and you are at an increased risk for chlamydia or gonorrhea. Ask your health care provider if you are at risk. If you are at risk of being infected with HIV, it is recommended that you take a prescription medicine daily to prevent HIV infection. This is called pre-exposure prophylaxis (PrEP). You are considered at risk if: You are a man who has sex with other men (MSM). You are a heterosexual man who is sexually active with multiple partners. You take drugs by injection. You are sexually active with a partner who has HIV. Talk with your health care provider about whether you are at high risk of being infected with HIV. If you choose to begin PrEP, you should first be tested for HIV. You should then be tested every 3 months for as long as you are taking PrEP. Use sunscreen. Apply sunscreen liberally and repeatedly throughout the day. You should seek shade when your shadow is shorter than you. Protect yourself by wearing long sleeves, pants, a wide-brimmed hat, and sunglasses year round whenever you are outdoors. Tell your health care provider of new moles or changes in moles, especially if there is a change in shape or color. Also, tell your  health care provider if a mole is larger than the size of a pencil eraser. Stay current with your vaccines (immunizations).

## 2015-02-23 NOTE — Progress Notes (Signed)
Patient ID: [name], male   DOB: [birth year], 64 y.o.   MRN: [id number]  Medicare Annual Wellness Visit and Comprehensive Evaluation, Examination and Management  Assessment:   1. Essential hypertension  - Microalbumin / creatinine urine ratio - [place], RETROPERITNL ABD,  LTD - TSH  2. Hyperlipidemia  - Lipid panel - Hemoglobin A1c - Insulin, random  3. Prediabetes   4. Vitamin D deficiency  - Vit D  25 hydroxy   5. Atherosclerosis of native coronary artery of native heart without angina pectoris   6. Seizure disorder ([place])   7. BMI 31.20, adult   8. Screening for rectal cancer  - POC Hemoccult Bld/Stl   9. Prostate cancer screening  - PSA  10. Depression screen   11. At low risk for fall   12. Medication management  - Urinalysis, Routine w reflex microscopic - CBC with Differential/Platelet - BASIC METABOLIC PANEL WITH GFR - Hepatic function panel - Magnesium - Carbamazepine level, total  13. Bladder neck obstruction  - PSA - Hemoglobin A1c - Insulin, random  14. Other abnormal glucose   15. High blood pressure associated with diabetes (HCC)  - Hemoglobin A1c  16. Encounter for general adult medical examination with abnormal findings  - Microalbumin / creatinine urine ratio - [place], RETROPERITNL ABD,  LTD - POC Hemoccult Bld/Stl  - PSA - Urinalysis, Routine w reflex microscopic - CBC with Differential/Platelet - BASIC METABOLIC PANEL WITH GFR - Hepatic function panel - Magnesium - Lipid panel - TSH - Hemoglobin A1c - Insulin, random - Vit D  25 hydroxy   17. Need for prophylactic vaccination and inoculation against influenza  - Flu vaccine HIGH DOSE PF (Fluzone High dose)  Plan:   During the course of the visit the patient was educated and counseled about appropriate screening and preventive services including:    Pneumococcal vaccine   Influenza vaccine  Td vaccine  Screening electrocardiogram  Bone densitometry  screening  Colorectal cancer screening  Diabetes screening  Glaucoma screening  Nutrition counseling   Advanced directives: requested  Screening recommendations, referrals: Vaccinations: Immunization History  Administered Date(s) Administered  . Influenza, High Dose Seasonal PF 02/23/2015  . Pneumococcal Polysaccharide-23 04/22/2009  . Td 04/22/2009  . Zoster 09/09/2009  Prevnar vaccine deferred - Out of Stock Hep B vaccine not indicated  Nutrition assessed and recommended  Colonoscopy 2006 and decline f/u , but is amenable to Cologard testing Recommended yearly ophthalmology/optometry visit for glaucoma screening and checkup Recommended yearly dental visit for hygiene and checkup Advanced directives - Yes  Conditions/risks identified: BMI: Discussed weight loss, diet, and increase physical activity.  Increase physical activity: AHA recommends 150 minutes of physical activity a week.  Medications reviewed PreDiabetes is not at goal, ACE/ARB therapy: Not Indicated Urinary Incontinence is not an issue: discussed non pharmacology and pharmacology options.  Fall risk: low- discussed PT, home fall assessment, medications.   Subjective:      [name] presents for [employer] Visit and also presents for a comprehensive evaluation, examination and management of multiple medical co-morbidities.  Patient had a Welcome to Medicare Visit on 01/29/2014.  This very nice 64 y.o. MWM presents for  follow up with Hypertension, Hyperlipidemia, Pre-Diabetes and Vitamin D Deficiency. 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      Patient is  also treated for HTN since 2004 and in 2008 he underwent CABG and now is followed annually by Dr Aundra Dubin. BP has been controlled at home. Today's BP: 138/80 mmHg. Patient has  had no complaints of any cardiac type chest pain, palpitations, dyspnea/orthopnea/PND, dizziness, claudication, or dependent edema.    Hyperlipidemia is controlled with diet & meds. Patient denies myalgias or other med SE's. Last Lipids were at goal with Cholesterol 145; HDL 39*; LDL 77; and  Triglycerides 146 on 10/07/2014.     Also, the patient has history of PreDiabetes noted over the last year with A1c 5.7% and gradually increasing and he has had no symptoms of reactive hypoglycemia, diabetic polys, paresthesias or visual blurring.  Last A1c was  6.1% on 10/07/2014.     Further, the patient also has history of Vitamin D Deficiency of 24 in 2008 and supplements vitamin D without any suspected side-effects. Last vitamin D was 77 in Sept 2015.   Names of Other Physician/Practitioners you currently use: 1. [name] here for primary care 2. Dr Delman Cheadle, eye doctor, last visit 2014 & Patient is a retired Therapist, music who  intends to schedule f/u as recommended today 3. Dr Olena Heckle, [place], dentist, last visit 2014 and patient also intends to schedule Dental f/u.  Patient Care Team: Geoffrey Pinto, MD as PCP - General (Internal Medicine) Geoffrey Dresser, MD as Consulting Physician (Cardiology)  Medication Review: Current Outpatient Prescriptions on File Prior to Visit  Medication Sig Dispense Refill  . aspirin 81 MG tablet Take 81 mg by mouth daily.      . carbamazepine (CARBATROL) 300 MG 12 hr capsule TAKE 1 CAPSULE BY MOUTH 2 TIMES DAILY. 360 capsule 4  . Cholecalciferol (VITAMIN D PO) Take 10,000 Units by mouth daily.      . fish oil-omega-3 fatty acids 1000 MG capsule Take 1 g by mouth 2 (two) times daily.     . LUTEIN PO Take by mouth daily.    . Magnesium 250 MG TABS Take 250 mg by mouth daily.    . metoprolol succinate (TOPROL-XL) 100 MG 24 hr tablet TAKE 1 TABLET BY MOUTH ONCE DAILY FOR BLOOD PRESSURE 90 tablet PRN  . rosuvastatin (CRESTOR) 40 MG tablet  Take 1 tablet (40 mg total) by mouth daily. 90 tablet 1   No current facility-administered medications on file prior to visit.    Allergies  Allergen Reactions  . Lipitor [Atorvastatin]     myalgia  . Niacin And Related Other (See Comments)    Pt unaware of this intolerance (10/31/13)    Current Problems (verified) Patient Active Problem List   Diagnosis Date Noted  . Prediabetes 02/23/2015  . BMI 31.20, adult 02/23/2015  . Morbid obesity ([place])  (BMI 31.20) 01/29/2014  . Vitamin D deficiency 04/03/2013  . Medication management 04/03/2013  . CAD (coronary artery disease) 10/01/2011  . Coronary atherosclerosis 08/31/2010  . Hypertension 08/31/2010  . Seizure disorder ([place])   . Hyperlipidemia     Screening Tests Health Maintenance  Topic Date Due  . Hepatitis C Screening  1950-10-16  . OPHTHALMOLOGY EXAM  06/15/1958  . COLONOSCOPY  06/15/1998  . PNA vac Low Risk Adult (1 of 2 - PCV13) 06/15/2013  . URINE MICROALBUMIN  10/01/2014  . INFLUENZA VACCINE  12/21/2014  . FOOT EXAM  01/30/2015  . HEMOGLOBIN A1C  04/09/2015  . TETANUS/TDAP  04/23/2019  . ZOSTAVAX  Completed    Immunization History  Administered Date(s) Administered  .  Influenza, High Dose Seasonal PF 02/23/2015  . Pneumococcal Polysaccharide-23 04/22/2009  . Td 04/22/2009  . Zoster 09/09/2009    Preventative care: Last colonoscopy: 2006 and patient is amenable to do Cologard.  Past Medical History  Diagnosis Date  . Plantar fasciitis   . Ischemic heart disease   . Seizure disorder ([place])   . Hyperlipidemia   . Dyslipidemia   . Bilateral carpal tunnel syndrome   . Vitamin D deficiency     Past Surgical History  Procedure Laterality Date  . Coronary artery bypass graft  08/2006  . Cholecystectomy    . Hand surgery      Risk Factors: Tobacco Social History  Substance Use Topics  . Smoking status: Former Smoker    Types: Cigarettes    Quit date: 05/22/1968  . Smokeless tobacco: Never Used   . Alcohol Use: No     Comment: very rarely   He does not smoke.  Patient is a former smoker. Are there smokers in your home (other than you)?  No  Alcohol Current alcohol use: very infrequently at social occasions  Caffeine Current caffeine use: coffee 2 cups /day  Exercise Current exercise: running/ jogging, walking and yard work  Nutrition/Diet Current diet: in general, a "healthy" diet    Cardiac risk factors: advanced age (older than 8 for men, 51 for women), dyslipidemia, hypertension, male gender, obesity (BMI >= 30 kg/m2) and smoking/ tobacco exposure.  Depression Screen (Note: if answer to either of the following is "Yes", a more complete depression screening is indicated)   Q1: Over the past two weeks, have you felt down, depressed or hopeless? No  Q2: Over the past two weeks, have you felt little interest or pleasure in doing things? No  Have you lost interest or pleasure in daily life? No  Do you often feel hopeless? No  Do you cry easily over simple problems? No  Activities of Daily Living In your present state of health, do you have any difficulty performing the following activities?:  Driving? No Managing money?  No Feeding yourself? No Getting from bed to chair? No Climbing a flight of stairs? No Preparing food and eating?: No Bathing or showering? No Getting dressed: No Getting to the toilet? No Using the toilet:No Moving around from place to place: No In the past year have you fallen or had a near fall?:No   Are you sexually active?  Yes  Do you have more than one partner?  No  Vision Difficulties: No  Hearing Difficulties: No Do you often ask people to speak up or repeat themselves? No Do you experience ringing or noises in your ears? No Do you have difficulty understanding soft or whispered voices? No  Cognition  Do you feel that you have a problem with memory?No  Do you often misplace items? No  Do you feel safe at home?  Yes  Advanced  directives Does patient have a [place]? Yes Does patient have a Living Will? Yes  ROS: Constitutional: Denies fever, chills, weight loss/gain, headaches, insomnia, fatigue, night sweats or change in appetite. Eyes: Denies redness, blurred vision, diplopia, discharge, itchy or watery eyes.  ENT: Denies discharge, congestion, post nasal drip, epistaxis, sore throat, earache, hearing loss, dental pain, Tinnitus, Vertigo, Sinus pain or snoring.  Cardio: Denies chest pain, palpitations, irregular heartbeat, syncope, dyspnea, diaphoresis, orthopnea, PND, claudication or edema Respiratory: denies cough, dyspnea, DOE, pleurisy, hoarseness, laryngitis or wheezing.  Gastrointestinal: Denies dysphagia, heartburn, reflux, water brash,  pain, cramps, nausea, vomiting, bloating, diarrhea, constipation, hematemesis, melena, hematochezia, jaundice or hemorrhoids Genitourinary: Denies dysuria, frequency, urgency, nocturia, hesitancy, discharge, hematuria or flank pain Musculoskeletal: Denies arthralgia, myalgia, stiffness, Jt. Swelling, pain, limp or strain/sprain. Denies Falls. Skin: Denies puritis, rash, hives, warts, acne, eczema or change in skin lesion Neuro: No weakness, tremor, incoordination, spasms, paresthesia or pain Psychiatric: Denies confusion, memory loss or sensory loss. Denies Depression. Endocrine: Denies change in weight, skin, hair change, nocturia, and paresthesia, diabetic polys, visual blurring or hyper / hypo glycemic episodes.  Heme/Lymph: No excessive bleeding, bruising or enlarged lymph nodes.  Objective:     BP 138/80 mmHg  Pulse 52  Temp(Src) 97.5 F (36.4 C)  Resp 16  Ht 5' 7.5" (1.715 m)  Wt 202 lb 3.2 oz (91.717 kg)  BMI 31.18 kg/m2  General Appearance:  Alert  WD/WN, male  in no apparent distress. Eyes: PERRLA, EOMs nl, conjunctiva normal, normal fundi and vessels. Sinuses: No frontal/maxillary tenderness ENT/Mouth: EACs patent / TMs  nl. Nares  clear without erythema, swelling, mucoid exudates. Oral hygiene is good. No erythema, swelling, or exudate. Tongue normal, non-obstructing. Tonsils not swollen or erythematous. Hearing normal.  Neck: Supple, thyroid normal. No bruits, nodes or JVD. Respiratory: Respiratory effort normal.  BS equal and clear bilateral without rales, rhonci, wheezing or stridor. Cardio: Heart sounds are normal with regular rate and rhythm and no murmurs, rubs or gallops. Peripheral pulses are normal and equal bilaterally without edema. No aortic or femoral bruits. Chest: symmetric with normal excursions and percussion.  Abdomen: Flat, soft, with nl bowel sounds. Nontender, no guarding, rebound, hernias, masses, or organomegaly.  Lymphatics: Non tender without lymphadenopathy.  Genitourinary: No hernias.Testes nl. DRE - prostate nl for age - smooth & firm w/o nodules. Musculoskeletal: Full ROM all peripheral extremities, joint stability, 5/5 strength, and normal gait. Skin: Warm and dry without rashes, lesions, cyanosis, clubbing or  ecchymosis.  Neuro: Cranial nerves intact, reflexes equal bilaterally. Normal muscle tone, no cerebellar symptoms. Sensation intact.  Pysch: Alert and oriented X 3 with normal affect, insight and judgment appropriate.   Cognitive Testing  Alert? Yes  Normal Appearance? Yes  Oriented to person? Yes  Place? Yes   Time? Yes  Recall of three objects?  Yes  Can perform simple calculations? Yes  Displays appropriate judgment? Yes  Can read the correct time from a watch/clock? Yes  Medicare Attestation I have personally reviewed: The patient's medical and social history Their use of alcohol, tobacco or illicit drugs Their current medications and supplements The patient's functional ability including ADLs,fall risks, home safety risks, cognitive, and hearing and visual impairment Diet and physical activities Evidence for depression or mood disorders  The patient's weight, height,  BMI, and visual acuity have been recorded in the chart.  I have made referrals, counseling, and provided education to the patient based on review of the above and I have provided the patient with a written personalized care plan for preventive services.  Over 40 minutes of exam, counseling, chart review was performed.  Arieh Bogue DAVID, MD   02/23/2015

## 2015-02-24 LAB — URINALYSIS, ROUTINE W REFLEX MICROSCOPIC

## 2015-02-24 LAB — PSA: PSA: 0.44 ng/mL (ref <=4.00)

## 2015-02-24 LAB — INSULIN, RANDOM: Insulin: 9.4 u[IU]/mL (ref 2.0–19.6)

## 2015-02-24 LAB — MICROALBUMIN / CREATININE URINE RATIO

## 2015-02-24 LAB — HEMOGLOBIN A1C
HEMOGLOBIN A1C: 5.8 % — AB (ref <5.7)
Mean Plasma Glucose: 120 mg/dL — ABNORMAL HIGH (ref <117)

## 2015-02-24 LAB — VITAMIN D 25 HYDROXY (VIT D DEFICIENCY, FRACTURES): VIT D 25 HYDROXY: 72 ng/mL (ref 30–100)

## 2015-02-24 LAB — CARBAMAZEPINE LEVEL, TOTAL: Carbamazepine Lvl: 5.7 mg/L (ref 4.0–12.0)

## 2015-03-18 DIAGNOSIS — Z1211 Encounter for screening for malignant neoplasm of colon: Secondary | ICD-10-CM | POA: Diagnosis not present

## 2015-03-18 DIAGNOSIS — Z1212 Encounter for screening for malignant neoplasm of rectum: Secondary | ICD-10-CM | POA: Diagnosis not present

## 2015-03-30 LAB — COLOGUARD

## 2015-05-03 ENCOUNTER — Other Ambulatory Visit: Payer: Self-pay | Admitting: Internal Medicine

## 2015-06-04 ENCOUNTER — Ambulatory Visit: Payer: Self-pay | Admitting: Internal Medicine

## 2015-06-04 ENCOUNTER — Ambulatory Visit (INDEPENDENT_AMBULATORY_CARE_PROVIDER_SITE_OTHER): Payer: Medicare Other | Admitting: Internal Medicine

## 2015-06-04 ENCOUNTER — Encounter: Payer: Self-pay | Admitting: Internal Medicine

## 2015-06-04 VITALS — BP 140/76 | HR 60 | Temp 97.7°F | Resp 16 | Ht 67.5 in | Wt 203.8 lb

## 2015-06-04 DIAGNOSIS — I2510 Atherosclerotic heart disease of native coronary artery without angina pectoris: Secondary | ICD-10-CM

## 2015-06-04 DIAGNOSIS — I10 Essential (primary) hypertension: Secondary | ICD-10-CM

## 2015-06-04 DIAGNOSIS — E559 Vitamin D deficiency, unspecified: Secondary | ICD-10-CM | POA: Diagnosis not present

## 2015-06-04 DIAGNOSIS — G40909 Epilepsy, unspecified, not intractable, without status epilepticus: Secondary | ICD-10-CM | POA: Diagnosis not present

## 2015-06-04 DIAGNOSIS — R7309 Other abnormal glucose: Secondary | ICD-10-CM | POA: Diagnosis not present

## 2015-06-04 DIAGNOSIS — E785 Hyperlipidemia, unspecified: Secondary | ICD-10-CM

## 2015-06-04 DIAGNOSIS — I2583 Coronary atherosclerosis due to lipid rich plaque: Secondary | ICD-10-CM

## 2015-06-04 DIAGNOSIS — Z79899 Other long term (current) drug therapy: Secondary | ICD-10-CM | POA: Diagnosis not present

## 2015-06-04 DIAGNOSIS — R7303 Prediabetes: Secondary | ICD-10-CM

## 2015-06-04 LAB — MAGNESIUM: Magnesium: 1.8 mg/dL (ref 1.5–2.5)

## 2015-06-04 LAB — BASIC METABOLIC PANEL WITH GFR
BUN: 18 mg/dL (ref 7–25)
CALCIUM: 9.2 mg/dL (ref 8.6–10.3)
CO2: 26 mmol/L (ref 20–31)
CREATININE: 1 mg/dL (ref 0.70–1.25)
Chloride: 102 mmol/L (ref 98–110)
GFR, Est African American: >89 mL/min (ref >=60)
GFR, Est Non African American: 78 mL/min (ref >=60)
GLUCOSE: 99 mg/dL (ref 65–99)
Potassium: 4.1 mmol/L (ref 3.5–5.3)
SODIUM: 139 mmol/L (ref 135–146)

## 2015-06-04 LAB — CBC WITH DIFFERENTIAL/PLATELET
BASOS PCT: 0 % (ref 0–1)
Basophils Absolute: 0 10*3/uL (ref 0.0–0.1)
EOS PCT: 2 % (ref 0–5)
Eosinophils Absolute: 0.1 10*3/uL (ref 0.0–0.7)
HCT: 43 % (ref 39.0–52.0)
HEMOGLOBIN: 14.7 g/dL (ref 13.0–17.0)
Lymphocytes Relative: 35 % (ref 12–46)
Lymphs Abs: 1.2 10*3/uL (ref 0.7–4.0)
MCH: 30.8 pg (ref 26.0–34.0)
MCHC: 34.2 g/dL (ref 30.0–36.0)
MCV: 90 fL (ref 78.0–100.0)
MONO ABS: 0.4 10*3/uL (ref 0.1–1.0)
MONOS PCT: 10 % (ref 3–12)
MPV: 9.4 fL (ref 8.6–12.4)
NEUTROS ABS: 1.9 10*3/uL (ref 1.7–7.7)
Neutrophils Relative %: 53 % (ref 43–77)
Platelets: 152 10*3/uL (ref 150–400)
RBC: 4.78 MIL/uL (ref 4.22–5.81)
RDW: 12.9 % (ref 11.5–15.5)
WBC: 3.5 10*3/uL — ABNORMAL LOW (ref 4.0–10.5)

## 2015-06-04 LAB — LIPID PANEL
Cholesterol: 134 mg/dL (ref 125–200)
HDL: 44 mg/dL (ref >=40)
LDL CALC: 65 mg/dL (ref <130)
TRIGLYCERIDES: 123 mg/dL (ref <150)
Total CHOL/HDL Ratio: 3 Ratio (ref <=5.0)
VLDL: 25 mg/dL (ref <30)

## 2015-06-04 LAB — HEMOGLOBIN A1C
Hgb A1c MFr Bld: 5.8 % — ABNORMAL HIGH (ref <5.7)
Mean Plasma Glucose: 120 mg/dL — ABNORMAL HIGH (ref <117)

## 2015-06-04 LAB — HEPATIC FUNCTION PANEL
ALK PHOS: 59 U/L (ref 40–115)
ALT: 19 U/L (ref 9–46)
AST: 25 U/L (ref 10–35)
Albumin: 4.2 g/dL (ref 3.6–5.1)
BILIRUBIN DIRECT: 0.1 mg/dL (ref <=0.2)
BILIRUBIN INDIRECT: 0.4 mg/dL (ref 0.2–1.2)
BILIRUBIN TOTAL: 0.5 mg/dL (ref 0.2–1.2)
Total Protein: 6.7 g/dL (ref 6.1–8.1)

## 2015-06-04 LAB — TSH: TSH: 3.641 u[IU]/mL (ref 0.350–4.500)

## 2015-06-04 NOTE — Patient Instructions (Signed)

## 2015-06-04 NOTE — Progress Notes (Signed)
Patient ID: [name], male   DOB: 1950-10-09, 65 y.o.   MRN: [id number]   This very nice 65 y.o.male presents for 3 month follow up with Hypertension, ASCAD,  Hyperlipidemia, Pre-Diabetes and Vitamin D Deficiency. Patient has remote hx/o atypical Temporal lobe seizures with fugue state - well controlled on meds.    Patient is treated for HTN & BP has been controlled at home. Today's BP: 140/76 mmHg. Patient had CABG in 2008 and has done well since.  Patient has had no complaints of any cardiac type chest pain, palpitations, dyspnea/orthopnea/PND, dizziness, claudication, or dependent edema.   Hyperlipidemia is controlled with diet & meds. Patient denies myalgias or other med SE's. Last Lipids were at goal with Cholesterol 136; HDL 40; LDL 61; Triglycerides 175 on 02/23/2015.   Also, the patient is Morbidly Obese (BMI 31.43) and is screened proactively for PreDiabetes and has had no symptoms of reactive hypoglycemia, diabetic polys, paresthesias or visual blurring.  Last A1c was 5.8% on 02/23/2015.   Further, the patient also has history of Vitamin D Deficiency of "24" in 2008 and supplements vitamin D without any suspected side-effects. Last vitamin D was 72 on 02/23/2015.  Medication Sig  . aspirin 81 MG tablet Take 81 mg by mouth daily.    . carbamazepine (CARBATROL) 300 MG 12 hr TAKE 1 CAP1 TIME DAILY.  Marland Kitchen VITAMIN D  Take 10,000 Units by mouth daily.    . CRESTOR 40 MG tablet TAKE 1 TABLET BY MOUTH DAILY.  . fish oil-omega-3    1000 MG  Take 1 g by mouth 2 (two) times daily.   . LUTEIN Take by mouth daily.  . Magnesium 250 MG TABS Take 250 mg by mouth daily.  . metoprolol succinate-XL) 100 MG  TAKE 1 TABLET BY MOUTH ONCE DAILY FOR BLOOD PRESSURE   Allergies  Allergen Reactions  . Lipitor [Atorvastatin]     myalgia  . Niacin And Related Other (See Comments)    Pt unaware of this intolerance (10/31/13)   PMHx:   Past Medical History  Diagnosis Date  . Plantar fasciitis   . Ischemic  heart disease   . Seizure disorder ([place])   . Hyperlipidemia   . Dyslipidemia   . Bilateral carpal tunnel syndrome   . Vitamin D deficiency    Immunization History  Administered Date(s) Administered  . Influenza, High Dose Seasonal PF 02/23/2015  . Pneumococcal Polysaccharide-23 04/22/2009  . Td 04/22/2009  . Zoster 09/09/2009   Past Surgical History  Procedure Laterality Date  . Coronary artery bypass graft  08/2006  . Cholecystectomy    . Hand surgery     FHx:    Reviewed / unchanged  SHx:    Reviewed / unchanged  Systems Review:  Constitutional: Denies fever, chills, wt changes, headaches, insomnia, fatigue, night sweats, change in appetite. Eyes: Denies redness, blurred vision, diplopia, discharge, itchy, watery eyes.  ENT: Denies discharge, congestion, post nasal drip, epistaxis, sore throat, earache, hearing loss, dental pain, tinnitus, vertigo, sinus pain, snoring.  CV: Denies chest pain, palpitations, irregular heartbeat, syncope, dyspnea, diaphoresis, orthopnea, PND, claudication or edema. Respiratory: denies cough, dyspnea, DOE, pleurisy, hoarseness, laryngitis, wheezing.  Gastrointestinal: Denies dysphagia, odynophagia, heartburn, reflux, water brash, abdominal pain or cramps, nausea, vomiting, bloating, diarrhea, constipation, hematemesis, melena, hematochezia  or hemorrhoids. Genitourinary: Denies dysuria, frequency, urgency, nocturia, hesitancy, discharge, hematuria or flank pain. Musculoskeletal: Denies arthralgias, myalgias, stiffness, jt. swelling, pain, limping or strain/sprain.  Skin: Denies pruritus, rash, hives, warts, acne, eczema  or change in skin lesion(s). Neuro: No weakness, tremor, incoordination, spasms, paresthesia or pain. Psychiatric: Denies confusion, memory loss or sensory loss. Endo: Denies change in weight, skin or hair change.  Heme/Lymph: No excessive bleeding, bruising or enlarged lymph nodes.  Physical Exam  BP 140/76 mmHg  Pulse 60   Temp(Src) 97.7 F (36.5 C)  Resp 16  Ht 5' 7.5" (1.715 m)  Wt 203 lb 12.8 oz (92.443 kg)  BMI 31.43 kg/m2  Appears well nourished and in no distress. Eyes: PERRLA, EOMs, conjunctiva no swelling or erythema. Sinuses: No frontal/maxillary tenderness ENT/Mouth: EAC's clear, TM's nl w/o erythema, bulging. Nares clear w/o erythema, swelling, exudates. Oropharynx clear without erythema or exudates. Oral hygiene is good. Tongue normal, non obstructing. Hearing intact.  Neck: Supple. Thyroid nl. Car 2+/2+ without bruits, nodes or JVD. Chest: Respirations nl with BS clear & equal w/o rales, rhonchi, wheezing or stridor.  Cor: Heart sounds normal w/ regular rate and rhythm without sig. murmurs, gallops, clicks, or rubs. Peripheral pulses normal and equal  without edema.  Abdomen: Soft & bowel sounds normal. Non-tender w/o guarding, rebound, hernias, masses, or organomegaly.  Lymphatics: Unremarkable.  Musculoskeletal: Full ROM all peripheral extremities, joint stability, 5/5 strength, and normal gait.  Skin: Warm, dry without exposed rashes, lesions or ecchymosis apparent.  Neuro: Cranial nerves intact, reflexes equal bilaterally. Sensory-motor testing grossly intact. Tendon reflexes grossly intact.  Pysch: Alert & oriented x 3.  Insight and judgement nl & appropriate. No ideations.  Assessment and Plan:  1. Essential hypertension  - TSH  2. Hyperlipidemia  - Lipid panel - TSH  3. Prediabetes  - Hemoglobin A1c - Insulin, random  4. Vitamin D deficiency  - VITAMIN D 25 Hydroxy   5. Seizure disorder (HCC)  - Carbamazepine level, total  6. Coronary artery disease due to lipid rich plaque   7. Medication management  - CBC with Differential/Platelet - BASIC METABOLIC PANEL WITH GFR - Hepatic function panel - Magnesium - Carbamazepine level, total   Recommended regular exercise, BP monitoring, weight control, and discussed med and SE's. Recommended labs to assess and monitor  clinical status. Further disposition pending results of labs. Over 30 minutes of exam, counseling, chart review was performed

## 2015-06-05 LAB — INSULIN, RANDOM: Insulin: 8.1 u[IU]/mL (ref 2.0–19.6)

## 2015-06-05 LAB — CARBAMAZEPINE LEVEL, TOTAL: CARBAMAZEPINE LVL: 5.7 mg/L (ref 4.0–12.0)

## 2015-06-05 LAB — VITAMIN D 25 HYDROXY (VIT D DEFICIENCY, FRACTURES): Vit D, 25-Hydroxy: 65 ng/mL (ref 30–100)

## 2015-07-10 ENCOUNTER — Encounter: Payer: Self-pay | Admitting: *Deleted

## 2015-08-31 ENCOUNTER — Encounter: Payer: Self-pay | Admitting: Gastroenterology

## 2015-09-03 ENCOUNTER — Ambulatory Visit: Payer: Self-pay | Admitting: Internal Medicine

## 2015-09-13 ENCOUNTER — Encounter: Payer: Self-pay | Admitting: Internal Medicine

## 2015-09-13 MED FILL — METOPROLOL SUCC ER 100 MG T: 100 | 90 days supply | Qty: 90 | Fill #3

## 2015-09-13 NOTE — Patient Instructions (Addendum)

## 2015-09-13 NOTE — Progress Notes (Signed)
Patient ID: [name], male   DOB: 03-27-1951, 65 y.o.   MRN: [id number]      This very nice 65 y.o. MWM presents for  follow up with Hypertension, ASHD/CABG, HLD, Pre-Diabetes and Vitamin D Deficiency. Patient also has remote hx/o atypical Temporal lobe seizures (1997) manifest as an aura followed by a fugue state and he relates that he has self tapered his med dose from bid to qd without reoccurrence of his previous sx's.      Patient is treated for HTN (2004) & BP has been controlled at home. Today's BP: 138/82 mmHg. In 2008, patient underwent CABG and has done well since. Patient has had no complaints of any cardiac type chest pain, palpitations, dyspnea/orthopnea/PND, dizziness, claudication, or dependent edema.     Hyperlipidemia is controlled with diet & meds. Patient describes 1 month prodrome of worsening  Myalgias of his neck and shoulders (had hx/o severe weakness on Lipitor). Last Lipids were  Cholesterol 134; HDL 44; LDL 65; Triglycerides 123 on 06/04/2015.     Also, the patient has history of PreDiabetes with A1c 5.7% in Nov 2014 and has had no symptoms of reactive hypoglycemia, diabetic polys, paresthesias or visual blurring.  Last A1c was 5.8% on 04/03/2016.     Further, the patient also has history of Vitamin D Deficiency of "24" in 2004 and supplements vitamin D without any suspected side-effects. Last Vitamin D was 65 on 06/04/2015.  Medication Sig  . aspirin 81 MG tablet Take  daily.    . carbamazepine 300 MG 12 hr  TAKE 1 CAP 2 TIMES DAILY. - takes 1 tab daily  . VITAMIN D  Take 10,000 Units  daily.    . CRESTOR 40 MG  TAKE 1 TAB DAILY. - takes 1/2 tab (20 mg) daily.   . fish oil-omega-3  1000 MG  Take  2  times daily.   . LUTEIN  Take  daily.  . Magnesium 250 MG  Take 250 mg by mouth daily.  . metoprolol succ -XL 100 MG  TAKE 1 TAB ONCE DAILY FOR BLOOD PRESSURE   Allergies  Allergen Reactions  . Lipitor [Atorvastatin]     myalgia  . Niacin And Related Other (See  Comments)    Pt unaware of this intolerance (10/31/13)   PMHx:   Past Medical History  Diagnosis Date  . Plantar fasciitis   . Ischemic heart disease   . Seizure disorder ([place])   . Hyperlipidemia   . Dyslipidemia   . Bilateral carpal tunnel syndrome   . Vitamin D deficiency    Immunization History  Administered Date(s) Administered  . Influenza, High Dose Seasonal PF 02/23/2015  . Pneumococcal Polysaccharide-23 04/22/2009  . Td 04/22/2009  . Zoster 09/09/2009   Past Surgical History  Procedure Laterality Date  . Coronary artery bypass graft  08/2006  . Cholecystectomy    . Hand surgery     FHx:    Reviewed / unchanged  SHx:    Reviewed / unchanged  Systems Review:  Constitutional: Denies fever, chills, wt changes, headaches, insomnia, fatigue, night sweats, change in appetite. Eyes: Denies redness, blurred vision, diplopia, discharge, itchy, watery eyes.  ENT: Denies discharge, congestion, post nasal drip, epistaxis, sore throat, earache, hearing loss, dental pain, tinnitus, vertigo, sinus pain, snoring.  CV: Denies chest pain, palpitations, irregular heartbeat, syncope, dyspnea, diaphoresis, orthopnea, PND, claudication or edema. Respiratory: denies cough, dyspnea, DOE, pleurisy, hoarseness, laryngitis, wheezing.  Gastrointestinal: Denies dysphagia, odynophagia, heartburn, reflux, water brash, abdominal  pain or cramps, nausea, vomiting, bloating, diarrhea, constipation, hematemesis, melena, hematochezia  or hemorrhoids. Genitourinary: Denies dysuria, frequency, urgency, nocturia, hesitancy, discharge, hematuria or flank pain. Musculoskeletal: Denies arthralgias, myalgias, stiffness, jt. swelling, pain, limping or strain/sprain.  Skin: Denies pruritus, rash, hives, warts, acne, eczema or change in skin lesion(s). Neuro: No weakness, tremor, incoordination, spasms, paresthesia or pain. Psychiatric: Denies confusion, memory loss or sensory loss. Endo: Denies change in weight,  skin or hair change.  Heme/Lymph: No excessive bleeding, bruising or enlarged lymph nodes.  Physical Exam  BP 138/82 mmHg  Pulse 56  Temp(Src) 97.3 F (36.3 C)  Resp 16  Ht 5' 7.5" (1.715 m)  Wt 203 lb 3.2 oz (92.171 kg)  BMI 31.34 kg/m2  Appears well nourished and in no distress. Eyes: PERRLA, EOMs, conjunctiva no swelling or erythema. Sinuses: No frontal/maxillary tenderness ENT/Mouth: EAC's clear, TM's nl w/o erythema, bulging. Nares clear w/o erythema, swelling, exudates. Oropharynx clear without erythema or exudates. Oral hygiene is good. Tongue normal, non obstructing. Hearing intact.  Neck: Supple. Thyroid nl. Car 2+/2+ without bruits, nodes or JVD. Chest: Respirations nl with BS clear & equal w/o rales, rhonchi, wheezing or stridor.  Cor: Heart sounds normal w/ regular rate and rhythm without sig. murmurs, gallops, clicks, or rubs. Peripheral pulses normal and equal  without edema.  Abdomen: Soft & bowel sounds normal. Non-tender w/o guarding, rebound, hernias, masses, or organomegaly.  Lymphatics: Unremarkable.  Musculoskeletal: Full ROM all peripheral extremities, joint stability, 5/5 strength, and normal gait.  Skin: Warm, dry without exposed rashes, lesions or ecchymosis apparent.  Neuro: Cranial nerves intact, reflexes equal bilaterally. Sensory-motor testing grossly intact. Tendon reflexes grossly intact.  Pysch: Alert & oriented x 3.  Insight and judgement nl & appropriate. No ideations.  Assessment and Plan:  1. Essential hypertension  - TSH  2. Hyperlipidemia  - Lipid panel - TSH  3. Prediabetes  - Hemoglobin A1c - Insulin, random  4. Vitamin D deficiency  - VITAMIN D 25 Hydroxy   5. Seizure disorder (HCC)  - Carbamazepine level, total  6. Coronary artery disease involving coronary bypass graft  ([place])   7. Morbid obesity due to excess calories ([place])   8. Medication management  - CBC with Differential/Platelet - BASIC METABOLIC PANEL WITH  GFR - Hepatic function panel - Magnesium - Carbamazepine level, total  9. Myalgias   - CPK   - advised stop Crestor x 1-2 weeks to see is Sx's resolve , then try restart at 1/2 tablet 2x/week, other wise consider trial on Zetia for lipids.    Recommended regular exercise, BP monitoring, weight control, and discussed med and SE's. Recommended labs to assess and monitor clinical status. Further disposition pending results of labs. Over 30 minutes of exam, counseling, chart review was performed

## 2015-09-14 ENCOUNTER — Ambulatory Visit (INDEPENDENT_AMBULATORY_CARE_PROVIDER_SITE_OTHER): Payer: Medicare Other | Admitting: Internal Medicine

## 2015-09-14 ENCOUNTER — Encounter: Payer: Self-pay | Admitting: Internal Medicine

## 2015-09-14 VITALS — BP 138/82 | HR 56 | Temp 97.3°F | Resp 16 | Ht 67.5 in | Wt 203.2 lb

## 2015-09-14 DIAGNOSIS — M791 Myalgia, unspecified site: Secondary | ICD-10-CM

## 2015-09-14 DIAGNOSIS — G40909 Epilepsy, unspecified, not intractable, without status epilepticus: Secondary | ICD-10-CM | POA: Diagnosis not present

## 2015-09-14 DIAGNOSIS — Z79899 Other long term (current) drug therapy: Secondary | ICD-10-CM | POA: Diagnosis not present

## 2015-09-14 DIAGNOSIS — I25708 Atherosclerosis of coronary artery bypass graft(s), unspecified, with other forms of angina pectoris: Secondary | ICD-10-CM | POA: Diagnosis not present

## 2015-09-14 DIAGNOSIS — R7303 Prediabetes: Secondary | ICD-10-CM | POA: Diagnosis not present

## 2015-09-14 DIAGNOSIS — E785 Hyperlipidemia, unspecified: Secondary | ICD-10-CM

## 2015-09-14 DIAGNOSIS — I10 Essential (primary) hypertension: Secondary | ICD-10-CM | POA: Diagnosis not present

## 2015-09-14 DIAGNOSIS — E559 Vitamin D deficiency, unspecified: Secondary | ICD-10-CM

## 2015-09-14 DIAGNOSIS — R7309 Other abnormal glucose: Secondary | ICD-10-CM | POA: Diagnosis not present

## 2015-09-14 LAB — CBC WITH DIFFERENTIAL/PLATELET
BASOS PCT: 0 %
Basophils Absolute: 0 cells/uL (ref 0–200)
EOS PCT: 2 %
Eosinophils Absolute: 66 cells/uL (ref 15–500)
HEMATOCRIT: 42.6 % (ref 38.5–50.0)
Hemoglobin: 14.3 g/dL (ref 13.2–17.1)
LYMPHS ABS: 1089 {cells}/uL (ref 850–3900)
LYMPHS PCT: 33 %
MCH: 30.6 pg (ref 27.0–33.0)
MCHC: 33.6 g/dL (ref 32.0–36.0)
MCV: 91 fL (ref 80.0–100.0)
MONO ABS: 363 {cells}/uL (ref 200–950)
MPV: 10.1 fL (ref 7.5–12.5)
Monocytes Relative: 11 %
Neutro Abs: 1782 cells/uL (ref 1500–7800)
Neutrophils Relative %: 54 %
Platelets: 165 10*3/uL (ref 140–400)
RBC: 4.68 MIL/uL (ref 4.20–5.80)
RDW: 13.4 % (ref 11.0–15.0)
WBC: 3.3 10*3/uL — AB (ref 3.8–10.8)

## 2015-09-14 LAB — HEPATIC FUNCTION PANEL
ALK PHOS: 63 U/L (ref 40–115)
ALT: 18 U/L (ref 9–46)
AST: 19 U/L (ref 10–35)
Albumin: 4.2 g/dL (ref 3.6–5.1)
BILIRUBIN DIRECT: 0.1 mg/dL (ref <=0.2)
BILIRUBIN INDIRECT: 0.3 mg/dL (ref 0.2–1.2)
BILIRUBIN TOTAL: 0.4 mg/dL (ref 0.2–1.2)
Total Protein: 6.5 g/dL (ref 6.1–8.1)

## 2015-09-14 LAB — HEMOGLOBIN A1C
Hgb A1c MFr Bld: 5.7 % — ABNORMAL HIGH (ref <5.7)
Mean Plasma Glucose: 117 mg/dL

## 2015-09-14 LAB — LIPID PANEL
CHOL/HDL RATIO: 3.9 ratio (ref <=5.0)
CHOLESTEROL: 163 mg/dL (ref 125–200)
HDL: 42 mg/dL (ref >=40)
LDL CALC: 81 mg/dL (ref <130)
TRIGLYCERIDES: 199 mg/dL — AB (ref <150)
VLDL: 40 mg/dL — AB (ref <30)

## 2015-09-14 LAB — BASIC METABOLIC PANEL WITH GFR
BUN: 15 mg/dL (ref 7–25)
CALCIUM: 9.1 mg/dL (ref 8.6–10.3)
CO2: 27 mmol/L (ref 20–31)
Chloride: 104 mmol/L (ref 98–110)
Creat: 0.93 mg/dL (ref 0.70–1.25)
GFR, EST NON AFRICAN AMERICAN: 85 mL/min (ref >=60)
GLUCOSE: 99 mg/dL (ref 65–99)
POTASSIUM: 4.2 mmol/L (ref 3.5–5.3)
SODIUM: 139 mmol/L (ref 135–146)

## 2015-09-14 LAB — CK: CK TOTAL: 153 U/L (ref 7–232)

## 2015-09-14 LAB — TSH: TSH: 4.05 mIU/L (ref 0.40–4.50)

## 2015-09-14 LAB — MAGNESIUM: Magnesium: 1.9 mg/dL (ref 1.5–2.5)

## 2015-09-14 MED ORDER — CARBAMAZEPINE ER 300 MG PO CP12: ORAL_CAPSULE | ORAL | Status: DC

## 2015-09-14 MED ORDER — METOPROLOL SUCCINATE ER 100 MG PO TB24: ORAL_TABLET | ORAL | Status: DC

## 2015-09-15 LAB — INSULIN, RANDOM: Insulin: 11.9 u[IU]/mL (ref 2.0–19.6)

## 2015-09-15 LAB — VITAMIN D 25 HYDROXY (VIT D DEFICIENCY, FRACTURES): Vit D, 25-Hydroxy: 63 ng/mL (ref 30–100)

## 2015-09-15 LAB — CARBAMAZEPINE LEVEL, TOTAL: Carbamazepine Lvl: 6.2 mg/L (ref 4.0–12.0)

## 2015-09-22 ENCOUNTER — Ambulatory Visit: Payer: Self-pay | Admitting: Internal Medicine

## 2015-10-06 ENCOUNTER — Other Ambulatory Visit: Payer: Self-pay | Admitting: Internal Medicine

## 2015-10-06 DIAGNOSIS — M542 Cervicalgia: Secondary | ICD-10-CM

## 2015-10-06 DIAGNOSIS — M25512 Pain in left shoulder: Secondary | ICD-10-CM

## 2015-10-06 DIAGNOSIS — M25511 Pain in right shoulder: Secondary | ICD-10-CM

## 2015-10-12 DIAGNOSIS — M542 Cervicalgia: Secondary | ICD-10-CM | POA: Diagnosis not present

## 2015-10-12 DIAGNOSIS — M67912 Unspecified disorder of synovium and tendon, left shoulder: Secondary | ICD-10-CM | POA: Diagnosis not present

## 2015-10-12 DIAGNOSIS — M67911 Unspecified disorder of synovium and tendon, right shoulder: Secondary | ICD-10-CM | POA: Diagnosis not present

## 2015-11-15 ENCOUNTER — Other Ambulatory Visit: Payer: Self-pay | Admitting: *Deleted

## 2015-11-15 MED ORDER — ROSUVASTATIN CALCIUM 40 MG PO TABS: 40.0000 mg | ORAL_TABLET | Freq: Every day | ORAL | Status: DC

## 2015-11-16 ENCOUNTER — Other Ambulatory Visit: Payer: Self-pay | Admitting: *Deleted

## 2015-11-16 DIAGNOSIS — M67911 Unspecified disorder of synovium and tendon, right shoulder: Secondary | ICD-10-CM | POA: Diagnosis not present

## 2015-11-16 DIAGNOSIS — M542 Cervicalgia: Secondary | ICD-10-CM | POA: Diagnosis not present

## 2015-11-16 DIAGNOSIS — M67912 Unspecified disorder of synovium and tendon, left shoulder: Secondary | ICD-10-CM | POA: Diagnosis not present

## 2015-11-16 MED ORDER — ROSUVASTATIN CALCIUM 40 MG PO TABS: 40.0000 mg | ORAL_TABLET | Freq: Every day | ORAL | Status: DC

## 2015-11-16 MED FILL — tiZANidine HCL 2 MG TABS: 2 | 10 days supply | Qty: 30 | Fill #0

## 2015-11-16 MED FILL — ROSUVASTATIN CALCIUM 40 MG: 40 | 14 days supply | Qty: 14 | Fill #0

## 2015-11-19 DIAGNOSIS — H2513 Age-related nuclear cataract, bilateral: Secondary | ICD-10-CM | POA: Diagnosis not present

## 2015-12-17 ENCOUNTER — Ambulatory Visit (INDEPENDENT_AMBULATORY_CARE_PROVIDER_SITE_OTHER): Payer: Medicare Other | Admitting: Physician Assistant

## 2015-12-17 ENCOUNTER — Encounter: Payer: Self-pay | Admitting: Physician Assistant

## 2015-12-17 VITALS — BP 122/80 | HR 56 | Temp 97.9°F | Resp 16 | Ht 67.5 in | Wt 205.6 lb

## 2015-12-17 DIAGNOSIS — E559 Vitamin D deficiency, unspecified: Secondary | ICD-10-CM

## 2015-12-17 DIAGNOSIS — Z79899 Other long term (current) drug therapy: Secondary | ICD-10-CM

## 2015-12-17 DIAGNOSIS — I10 Essential (primary) hypertension: Secondary | ICD-10-CM

## 2015-12-17 DIAGNOSIS — R6889 Other general symptoms and signs: Secondary | ICD-10-CM | POA: Diagnosis not present

## 2015-12-17 DIAGNOSIS — Z0001 Encounter for general adult medical examination with abnormal findings: Secondary | ICD-10-CM

## 2015-12-17 DIAGNOSIS — G40909 Epilepsy, unspecified, not intractable, without status epilepticus: Secondary | ICD-10-CM

## 2015-12-17 DIAGNOSIS — Z23 Encounter for immunization: Secondary | ICD-10-CM | POA: Diagnosis not present

## 2015-12-17 DIAGNOSIS — Z0000 Encounter for general adult medical examination without abnormal findings: Secondary | ICD-10-CM

## 2015-12-17 DIAGNOSIS — R7303 Prediabetes: Secondary | ICD-10-CM

## 2015-12-17 DIAGNOSIS — E785 Hyperlipidemia, unspecified: Secondary | ICD-10-CM | POA: Diagnosis not present

## 2015-12-17 DIAGNOSIS — I25708 Atherosclerosis of coronary artery bypass graft(s), unspecified, with other forms of angina pectoris: Secondary | ICD-10-CM

## 2015-12-17 LAB — CBC WITH DIFFERENTIAL/PLATELET
BASOS PCT: 1 %
Basophils Absolute: 41 cells/uL (ref 0–200)
EOS PCT: 2 %
Eosinophils Absolute: 82 cells/uL (ref 15–500)
HCT: 42.8 % (ref 38.5–50.0)
Hemoglobin: 14.8 g/dL (ref 13.2–17.1)
Lymphocytes Relative: 32 %
Lymphs Abs: 1312 cells/uL (ref 850–3900)
MCH: 31.2 pg (ref 27.0–33.0)
MCHC: 34.6 g/dL (ref 32.0–36.0)
MCV: 90.1 fL (ref 80.0–100.0)
MONOS PCT: 10 %
MPV: 10 fL (ref 7.5–12.5)
Monocytes Absolute: 410 cells/uL (ref 200–950)
NEUTROS ABS: 2255 {cells}/uL (ref 1500–7800)
Neutrophils Relative %: 55 %
PLATELETS: 158 10*3/uL (ref 140–400)
RBC: 4.75 MIL/uL (ref 4.20–5.80)
RDW: 13.1 % (ref 11.0–15.0)
WBC: 4.1 10*3/uL (ref 3.8–10.8)

## 2015-12-17 LAB — HEPATIC FUNCTION PANEL
ALBUMIN: 4.1 g/dL (ref 3.6–5.1)
ALK PHOS: 63 U/L (ref 40–115)
ALT: 19 U/L (ref 9–46)
AST: 22 U/L (ref 10–35)
Bilirubin, Direct: 0.1 mg/dL (ref <=0.2)
Indirect Bilirubin: 0.4 mg/dL (ref 0.2–1.2)
TOTAL PROTEIN: 6.6 g/dL (ref 6.1–8.1)
Total Bilirubin: 0.5 mg/dL (ref 0.2–1.2)

## 2015-12-17 LAB — LIPID PANEL
Cholesterol: 145 mg/dL (ref 125–200)
HDL: 47 mg/dL (ref >=40)
LDL CALC: 68 mg/dL (ref <130)
Total CHOL/HDL Ratio: 3.1 Ratio (ref <=5.0)
Triglycerides: 152 mg/dL — ABNORMAL HIGH (ref <150)
VLDL: 30 mg/dL (ref <30)

## 2015-12-17 LAB — BASIC METABOLIC PANEL WITH GFR
BUN: 17 mg/dL (ref 7–25)
CHLORIDE: 104 mmol/L (ref 98–110)
CO2: 26 mmol/L (ref 20–31)
Calcium: 8.9 mg/dL (ref 8.6–10.3)
Creat: 1.01 mg/dL (ref 0.70–1.25)
GFR, Est African American: 89 mL/min (ref >=60)
GFR, Est Non African American: 77 mL/min (ref >=60)
GLUCOSE: 107 mg/dL — AB (ref 65–99)
POTASSIUM: 4.3 mmol/L (ref 3.5–5.3)
Sodium: 140 mmol/L (ref 135–146)

## 2015-12-17 LAB — TSH: TSH: 2.87 mIU/L (ref 0.40–4.50)

## 2015-12-17 LAB — HEMOGLOBIN A1C
HEMOGLOBIN A1C: 5.6 % (ref <5.7)
Mean Plasma Glucose: 114 mg/dL

## 2015-12-17 MED ORDER — METOPROLOL SUCCINATE ER 100 MG PO TB24: ORAL_TABLET | ORAL | 1 refills | Status: DC

## 2015-12-17 NOTE — Patient Instructions (Signed)
Here is some information to help you keep your heart healthy: Move it! - Aim for 30 mins of activity every day. Take it slowly at first. Talk to [place] before starting any new exercise program.   Lose it.  -Body Mass Index (BMI) can indicate if you need to lose weight. A healthy range is 18.5-24.9. For a BMI calculator, go to [employer]  Waist Management -Excess abdominal fat is a risk factor for heart disease, diabetes, asthma, stroke and more. Ideal waist circumference is less than 35" for women and less than 40" for men.   Eat Right -focus on fruits, vegetables, whole grains, and meals you make yourself. Avoid foods with trans fat and high sugar/sodium content.   Snooze or Snore? - Loud snoring can be a sign of sleep apnea, a significant risk factor for high blood pressure, heart attach, stroke, and heart arrhythmias.  Kick the habit -Quit Smoking! Avoid second hand smoke. A single cigarette raises your blood pressure for 20 mins and increases the risk of heart attack and stroke for the next 24 hours.   Are Aspirin and Supplements right for you? -Add ENTERIC COATED low dose 81 mg Aspirin daily OR can do every other day if you have easy bruising to protect your heart and head. As well as to reduce risk of Colon Cancer by 20 %, Skin Cancer by 26 % , Melanoma by 46% and Pancreatic cancer by 60%  Say "No to Stress -There may be little you can do about problems that cause stress. However, techniques such as long walks, meditation, and exercise can help you manage it.   Start Now! - Make changes one at a time and set reasonable goals to increase your likelihood of success.   Pneumococcal Vaccine, Polyvalent suspension for injection What is this medicine? PNEUMOCOCCAL VACCINE (NEU mo KOK al vak SEEN) is a vaccine used to prevent pneumococcus bacterial infections. These bacteria can cause serious infections like pneumonia, meningitis, and blood infections. This vaccine will lower your chance  of getting pneumonia. If you do get pneumonia, it can make your symptoms milder and your illness shorter. This vaccine will not treat an infection and will not cause infection. This vaccine is recommended for infants and young children, adults with certain medical conditions, and adults 20 years or older. This medicine may be used for other purposes; ask your health care provider or pharmacist if you have questions. What should I tell my health care provider before I take this medicine? They need to know if you have any of these conditions: -bleeding problems -fever -immune system problems -an unusual or allergic reaction to pneumococcal vaccine, diphtheria toxoid, other vaccines, latex, other medicines, foods, dyes, or preservatives -pregnant or trying to get pregnant -breast-feeding How should I use this medicine? This vaccine is for injection into a muscle. It is given by a health care professional. A copy of Vaccine Information Statements will be given before each vaccination. Read this sheet carefully each time. The sheet may change frequently. Talk to your pediatrician regarding the use of this medicine in children. While this drug may be prescribed for children as young as 25 weeks old for selected conditions, precautions do apply. Overdosage: If you think you have taken too much of this medicine contact a poison control center or emergency room at once. NOTE: This medicine is only for you. Do not share this medicine with others. What if I miss a dose? It is important not to miss your dose. Call your  doctor or health care professional if you are unable to keep an appointment. What may interact with this medicine? -medicines for cancer chemotherapy -medicines that suppress your immune function -steroid medicines like prednisone or cortisone This list may not describe all possible interactions. Give your health care provider a list of all the medicines, herbs, non-prescription drugs, or  dietary supplements you use. Also tell them if you smoke, drink alcohol, or use illegal drugs. Some items may interact with your medicine. What should I watch for while using this medicine? Mild fever and pain should go away in 3 days or less. Report any unusual symptoms to your doctor or health care professional. What side effects may I notice from receiving this medicine? Side effects that you should report to your doctor or health care professional as soon as possible: -allergic reactions like skin rash, itching or hives, swelling of the face, lips, or tongue -breathing problems -confused -fast or irregular heartbeat -fever over 102 degrees F -seizures -unusual bleeding or bruising -unusual muscle weakness Side effects that usually do not require medical attention (report to your doctor or health care professional if they continue or are bothersome): -aches and pains -diarrhea -fever of 102 degrees F or less -headache -irritable -loss of appetite -pain, tender at site where injected -trouble sleeping This list may not describe all possible side effects. Call your doctor for medical advice about side effects. You may report side effects to FDA at 1-800-FDA-1088. Where should I keep my medicine? This does not apply. This vaccine is given in a clinic, pharmacy, doctor's office, or other health care setting and will not be stored at home. NOTE: This sheet is a summary. It may not cover all possible information. If you have questions about this medicine, talk to your doctor, pharmacist, or health care provider.    2016, [employer]. (2014-02-12 10:27:27)

## 2015-12-17 NOTE — Progress Notes (Signed)
MEDICARE ANNUAL WELLNESS VISIT AND FOLLOW UP Assessment:   Essential hypertension -- continue medications, DASH diet, exercise and monitor at home. Call if greater than 130/80.  -     CBC with Differential/Platelet -     BASIC METABOLIC PANEL WITH GFR -     Hepatic function panel -     TSH -     metoprolol succinate (TOPROL-XL) 100 MG 24 hr tablet; TAKE 1 TABLET BY MOUTH ONCE DAILY FOR BLOOD PRESSURE  Coronary artery disease involving coronary bypass graft with other forms of angina pectoris ([place]) -Control blood pressure, cholesterol, glucose, increase exercise.  -     Lipid panel -     Hemoglobin A1c  Morbid obesity due to excess calories (HCC) Obesity with co morbidities- long discussion about weight loss, diet, and exercise  Seizure disorder ([place]) - continue medications and continue to monitor  Vitamin D deficiency Continue supplement  Prediabetes Discussed general issues about diabetes pathophysiology and management., Educational material distributed., Suggested low cholesterol diet., Encouraged aerobic exercise., Discussed foot care., Reminded to get yearly retinal exam. -     Hemoglobin A1c  Medication management  Hyperlipidemia -continue medications, check lipids, decrease fatty foods, increase activity.  -     Lipid panel  Medicare annual wellness visit, initial  Need for prophylactic vaccination against Streptococcus pneumoniae (pneumococcus) -     Pneumococcal conjugate vaccine 13-valent IM    Over 30 minutes of exam, counseling, chart review, and critical decision making was performed  Future Appointments Date Time Provider [place]  04/17/2016 10:00 AM [name], MD [facility] None     Plan:   During the course of the visit the patient was educated and counseled about appropriate screening and preventive services including:    Pneumococcal vaccine   Influenza vaccine  Prevnar 13  Td vaccine  Screening  electrocardiogram  Colorectal cancer screening  Diabetes screening  Glaucoma screening  Nutrition counseling    Subjective:  [name] is a 65 y.o. male who presents for Medicare Annual Wellness Visit and 3 month follow up for HTN, hyperlipidemia, prediabetes, and vitamin D Def.   His blood pressure has been controlled at home, today their BP is BP: 122/80 He does workout, walks [place], very active. He denies chest pain, shortness of breath, dizziness.  He has history of temporal lobe seizures (1997) tapered down to once daily with his meds without reoccurrence.  Has history of CAD, s/p CABG in 2008, follows with Dr. Aundra Dubin.  He is on cholesterol medication and denies myalgias. His cholesterol is at goal. The cholesterol last visit was:   Lab Results  Component Value Date   CHOL 163 09/14/2015   HDL 42 09/14/2015   LDLCALC 81 09/14/2015   TRIG 199 (H) 09/14/2015   CHOLHDL 3.9 09/14/2015   He has been working on preDM, denies DM polys Lab Results  Component Value Date   HGBA1C 5.7 (H) 09/14/2015   Last GFR Lab Results  Component Value Date   GFRNONAA 85 09/14/2015    Patient is on Vitamin D supplement.   Lab Results  Component Value Date   VD25OH 63 09/14/2015      Medication Review: Current Outpatient Prescriptions on File Prior to Visit  Medication Sig Dispense Refill  . aspirin 81 MG tablet Take 81 mg by mouth daily.      . carbamazepine (CARBATROL) 300 MG 12 hr capsule TAKE 1 CAPSULE BY MOUTH 2 TIMES DAILY. 180 capsule 1  .  Cholecalciferol (VITAMIN D PO) Take 10,000 Units by mouth daily.      . fish oil-omega-3 fatty acids 1000 MG capsule Take 1 g by mouth 2 (two) times daily.     . LUTEIN PO Take by mouth daily.    . Magnesium 250 MG TABS Take 250 mg by mouth daily.    . metoprolol succinate (TOPROL-XL) 100 MG 24 hr tablet TAKE 1 TABLET BY MOUTH ONCE DAILY FOR BLOOD PRESSURE 90 tablet 1  . rosuvastatin (CRESTOR) 40 MG tablet Take 1 tablet (40  mg total) by mouth daily. 90 tablet 1   No current facility-administered medications on file prior to visit.     Allergies: Allergies  Allergen Reactions  . Lipitor [Atorvastatin]     myalgia  . Niacin And Related Other (See Comments)    Pt unaware of this intolerance (10/31/13)    Current Problems (verified) has Seizure disorder ([place]); Hyperlipidemia; Hypertension; CAD (coronary artery disease); Vitamin D deficiency; Medication management; Morbid obesity ([place])  (BMI 31.20); Prediabetes; BMI 31.20, adult; and Medicare annual wellness visit, initial on his problem list.  Screening Tests Immunization History  Administered Date(s) Administered  . Influenza, High Dose Seasonal PF 02/23/2015  . Pneumococcal Polysaccharide-23 04/22/2009  . Td 04/22/2009  . Zoster 09/09/2009    Preventative care: Last colonoscopy: cologuard 2016 Echo 2008 CXR 2008 AB [place] 2005  Prior vaccinations: TD or Tdap: 2010  Influenza: 2016 Pneumococcal: 2010 Prevnar13: DUE, getting today Shingles/Zostavax: 2011  Names of Other Physician/Practitioners you currently use: 1. [facility] here for primary care 2. Dr. Delman Cheadle, eye doctor, last visit 06/2015 3. Dr. Olena Heckle, dentist, last visit 2016 Patient Care Team: [name], MD as PCP - General (Internal Medicine) Larey Dresser, MD as Consulting Physician (Cardiology)  Surgical: He  has a past surgical history that includes Coronary artery bypass graft (08/2006); Cholecystectomy; and Hand surgery. Family His family history includes Diabetes in his father; Hypertension in his mother; Lupus in his sister; Stroke in his mother. Social history  He reports that he quit smoking about 47 years ago. His smoking use included Cigarettes. He has never used smokeless tobacco. He reports that he does not drink alcohol or use drugs.  MEDICARE WELLNESS OBJECTIVES: Physical activity: Current Exercise Habits: The patient does  not participate in regular exercise at present Cardiac risk factors: Cardiac Risk Factors include: advanced age (>73men, >26 women);hypertension;obesity (BMI >30kg/m2);sedentary lifestyle Depression/mood screen:   Depression screen [facility] 2/9 12/17/2015  Decreased Interest 0  Down, Depressed, Hopeless 0  PHQ - 2 Score 0    ADLs:  In your present state of health, do you have any difficulty performing the following activities: 12/17/2015 09/14/2015  Hearing? N N  Vision? N N  Difficulty concentrating or making decisions? N N  Walking or climbing stairs? N N  Dressing or bathing? N N  Doing errands, shopping? N N  Some recent data might be hidden     Cognitive Testing  Alert? Yes  Normal Appearance?Yes  Oriented to person? Yes  Place? Yes   Time? Yes  Recall of three objects?  Yes  Can perform simple calculations? Yes  Displays appropriate judgment?Yes  Can read the correct time from a watch face?Yes  EOL planning: Does patient have an advance directive?: Yes Type of Advance Directive: Healthcare Power of Attorney, Living will Does patient want to make changes to advanced directive?: No - Patient declined Copy of advanced directive(s) in chart?: No - copy requested  Objective:   Today's Vitals   12/17/15 1002  BP: 122/80  Pulse: (!) 56  Resp: 16  Temp: 97.9 F (36.6 C)  TempSrc: Temporal  SpO2: 96%  Weight: 205 lb 9.6 oz (93.3 kg)  Height: 5' 7.5" (1.715 m)  PainSc: 2   PainLoc: Shoulder   Body mass index is 31.73 kg/m.  General appearance: alert, no distress, WD/WN, male HEENT: normocephalic, sclerae anicteric, TMs pearly, nares patent, no discharge or erythema, pharynx normal Oral cavity: MMM, no lesions Neck: supple, no lymphadenopathy, no thyromegaly, no masses Heart: RRR, normal S1, S2, no murmurs Lungs: CTA bilaterally, no wheezes, rhonchi, or rales Abdomen: +bs, soft, non tender, non distended, no masses, no hepatomegaly, no splenomegaly Musculoskeletal:  nontender, no swelling, no obvious deformity Extremities: no edema, no cyanosis, no clubbing Pulses: 2+ symmetric, upper and lower extremities, normal cap refill Neurological: alert, oriented x 3, CN2-12 intact, strength normal upper extremities and lower extremities, sensation normal throughout, DTRs 2+ throughout, no cerebellar signs, gait normal Psychiatric: normal affect, behavior normal, pleasant   Medicare Attestation I have personally reviewed: The patient's medical and social history Their use of alcohol, tobacco or illicit drugs Their current medications and supplements The patient's functional ability including ADLs,fall risks, home safety risks, cognitive, and hearing and visual impairment Diet and physical activities Evidence for depression or mood disorders  The patient's weight, height, BMI, and visual acuity have been recorded in the chart.  I have made referrals, counseling, and provided education to the patient based on review of the above and I have provided the patient with a written personalized care plan for preventive services.     Vicie Mutters, PA-C   12/17/2015

## 2016-01-26 ENCOUNTER — Encounter: Payer: Self-pay | Admitting: Physician Assistant

## 2016-02-17 ENCOUNTER — Encounter: Payer: Self-pay | Admitting: Internal Medicine

## 2016-04-14 NOTE — Progress Notes (Signed)
[facility]   [name], M.D.    [name]. [name], P.A.-C      [name], P.A.-C  [facility]                [address] [place] [id number] Telephone [phone] Telefax [phone] Annual  Screening/Preventative Visit  & Comprehensive Evaluation & Examination     This very nice 65 y.o. MWM presents for a Screening/Preventative Visit & comprehensive evaluation and management of multiple medical co-morbidities.  Patient has been followed for HTN, Prediabetes, Hyperlipidemia and Vitamin D Deficiency.     Patient also relates Bilat shoulder pains since April, had XR's by Dr Berenice Primas and Bilat steroid injections w/o significant relief. Reports full ROM with aching after vigorous physical activities.      Patient has Hx/o atypical temporal lobe seizures predating to 1997 manifest with a fugue type state and no major motor activity and it has been years since his last seizure.     HTN predates circa  2004 and in 2008 he underwent emergent CABG. Patient's BP has been controlled at home.  Today's BP is 140/76 and rechecked at 125/77. Patient denies any cardiac symptoms as chest pain, palpitations, shortness of breath, dizziness or ankle swelling.     Patient's hyperlipidemia is controlled with diet and medications. Patient denies myalgias or other medication SE's. Last lipids were at goal: Lab Results  Component Value Date   CHOL 145 12/17/2015   HDL 47 12/17/2015   LDLCALC 68 12/17/2015   TRIG 152 (H) 12/17/2015   CHOLHDL 3.1 12/17/2015      Patient has prediabetes and A1c was 5.7% in Nov 2014  Patient denies reactive hypoglycemic symptoms, visual blurring, diabetic polys or paresthesias. Last A1c was at goal: Lab Results  Component Value Date   HGBA1C 5.6 12/17/2015       Finally, patient has history of Vitamin D Deficiency in 2008 of "24" and last vitamin D was at goal: Lab Results   Component Value Date   VD25OH 63 09/14/2015   Current Outpatient Prescriptions on File Prior to Visit  Medication Sig  . aspirin 81 MG tablet Take 81 mg by mouth daily.    . carbamazepine (CARBATROL) 300 MG 12 hr capsule TAKE 1 CAPSULE BY MOUTH 2 TIMES DAILY.  Marland Kitchen Cholecalciferol (VITAMIN D PO) Take 10,000 Units by mouth daily.    . fish oil-omega-3 fatty acids 1000 MG capsule Take 1 g by mouth 2 (two) times daily.   . LUTEIN PO Take by mouth daily.  . Magnesium 250 MG TABS Take 250 mg by mouth daily.  . metoprolol succinate (TOPROL-XL) 100 MG 24 hr tablet TAKE 1 TABLET BY MOUTH ONCE DAILY FOR BLOOD PRESSURE  . rosuvastatin (CRESTOR) 40 MG tablet Take 1 tablet (40 mg total) by mouth daily.   No current facility-administered medications on file prior to visit.    Allergies  Allergen Reactions  . Lipitor [Atorvastatin]     myalgia  . Niacin And Related Other (See Comments)    Pt unaware of this intolerance (10/31/13)   Past Medical History:  Diagnosis Date  . Bilateral carpal tunnel syndrome   . Dyslipidemia   . Hyperlipidemia   . Ischemic heart disease   . Plantar fasciitis   . Seizure disorder ([place])   . Vitamin D deficiency    Health Maintenance  Topic Date Due  . Hepatitis C Screening  03-Apr-1951  . INFLUENZA VACCINE  12/21/2015  . URINE MICROALBUMIN  02/23/2016  . PNA vac Low Risk Adult (2 of 2 - PPSV23) 12/16/2016  . Fecal DNA (Cologuard)  03/17/2018  . TETANUS/TDAP  04/23/2019  . ZOSTAVAX  Completed   Immunization History  Administered Date(s) Administered  . Influenza, High Dose Seasonal PF 02/23/2015  . Pneumococcal Conjugate-13 12/17/2015  . Pneumococcal Polysaccharide-23 04/22/2009  . Td 04/22/2009  . Zoster 09/09/2009   Past Surgical History:  Procedure Laterality Date  . CHOLECYSTECTOMY    . CORONARY ARTERY BYPASS GRAFT  08/2006  . HAND SURGERY     Family History  Problem Relation Age of Onset  . Hypertension Mother   . Stroke Mother   . Diabetes  Father   . Lupus Sister    Social History   Social History  . Marital status: Married    Spouse name: N/A  . Number of children: 2  . Years of education: N/A   Occupational History  . Licensed Optician    Social History Main Topics  . Smoking status: Former Smoker    Types: Cigarettes    Quit date: 05/22/1968  . Smokeless tobacco: Never Used  . Alcohol use No     Comment: very rarely  . Drug use: No  . Sexual activity: Active    ROS Constitutional: Denies fever, chills, weight loss/gain, headaches, insomnia,  night sweats or change in appetite. Does c/o fatigue. Eyes: Denies redness, blurred vision, diplopia, discharge, itchy or watery eyes.  ENT: Denies discharge, congestion, post nasal drip, epistaxis, sore throat, earache, hearing loss, dental pain, Tinnitus, Vertigo, Sinus pain or snoring.  Cardio: Denies chest pain, palpitations, irregular heartbeat, syncope, dyspnea, diaphoresis, orthopnea, PND, claudication or edema Respiratory: denies cough, dyspnea, DOE, pleurisy, hoarseness, laryngitis or wheezing.  Gastrointestinal: Denies dysphagia, heartburn, reflux, water brash, pain, cramps, nausea, vomiting, bloating, diarrhea, constipation, hematemesis, melena, hematochezia, jaundice or hemorrhoids Genitourinary: Denies dysuria, frequency, urgency, nocturia, hesitancy, discharge, hematuria or flank pain Musculoskeletal: Denies arthralgia, myalgia, stiffness, Jt. Swelling, pain, limp or strain/sprain. Denies Falls. Skin: Denies puritis, rash, hives, warts, acne, eczema or change in skin lesion Neuro: No weakness, tremor, incoordination, spasms, paresthesia or pain Psychiatric: Denies confusion, memory loss or sensory loss. Denies Depression. Endocrine: Denies change in weight, skin, hair change, nocturia, and paresthesia, diabetic polys, visual blurring or hyper / hypo glycemic episodes.  Heme/Lymph: No excessive bleeding, bruising or enlarged lymph nodes.  Physical Exam  BP  140/76   Pulse (!) 56   Temp 97.2 F (36.2 C)   Resp 16   Ht 5' 7.5" (1.715 m)   Wt 204 lb 12.8 oz (92.9 kg)   BMI 31.60 kg/m   General Appearance: Well nourished, in no apparent distress.  Eyes: PERRLA, EOMs, conjunctiva no swelling or erythema, normal fundi and vessels. Sinuses: No frontal/maxillary tenderness ENT/Mouth: EACs patent / TMs  nl. Nares clear without erythema, swelling, mucoid exudates. Oral hygiene is good. No erythema, swelling, or exudate. Tongue normal, non-obstructing. Tonsils not swollen or erythematous. Hearing normal.  Neck: Supple, thyroid normal. No bruits, nodes or JVD. Respiratory: Respiratory effort normal.  BS equal and clear bilateral without rales, rhonci, wheezing or stridor. Cardio: Heart sounds are normal with regular rate and rhythm and no murmurs, rubs or gallops. Peripheral pulses are normal and equal bilaterally without edema. No aortic or femoral bruits. Chest: symmetric with normal excursions and percussion.  Abdomen: Soft, with Nl bowel sounds.  Nontender, no guarding, rebound, hernias, masses, or organomegaly.  Lymphatics: Non tender without lymphadenopathy.  Genitourinary: No hernias.Testes nl. DRE - prostate nl for age - smooth & firm w/o nodules. Musculoskeletal: Full ROM all peripheral extremities,specifically FROM of int/ext rotation & abduction of the shoulders.  Nl muscle power, tone & bulk. Normal gait. Skin: Warm and dry without rashes, lesions, cyanosis, clubbing or  ecchymosis.  Neuro: Cranial nerves intact, reflexes equal bilaterally. Normal muscle tone, no cerebellar symptoms. Sensation intact.  Pysch: Alert and oriented X 3 with normal affect, insight and judgment appropriate.   Assessment and Plan  1. Annual Preventative/Screening Exam    2. Essential hypertension  - Microalbumin / creatinine urine ratio - EKG 12-Lead - [place], RETROPERITNL ABD,  LTD - TSH  3. Mixed hyperlipidemia  - Lipid panel - TSH  4. Prediabetes  -  Hemoglobin A1c - Insulin, random  5. Vitamin D deficiency  - VITAMIN D 25 Hydroxy   6. Coronary artery disease involving coronary bypass graft without angina pectoris   7. Seizure disorder (HCC)  - Carbamazepine level, total  8. Screening for rectal cancer  - POC Hemoccult Bld/Stl   9. Prostate cancer screening  - PSA  10. Abnormal glucose  - Hemoglobin A1c - Insulin, random  11. Bladder neck obstruction  - PSA  12. Screening for ischemic heart disease   13. Screening for AAA (aortic abdominal aneurysm)   14. Medication management  - Urinalysis, Routine w reflex microscopic  - CBC with Differential/Platelet - BASIC METABOLIC PANEL WITH GFR - Hepatic function panel - Magnesium - Carbamazepine level, total       Continue prudent diet as discussed, weight control, BP monitoring, regular exercise, and medications as discussed.  Discussed med effects and SE's. Routine screening labs and tests as requested with regular follow-up as recommended. Over 40 minutes of exam, counseling, chart review and high complex critical decision making was performed

## 2016-04-17 ENCOUNTER — Encounter: Payer: Self-pay | Admitting: Internal Medicine

## 2016-04-17 ENCOUNTER — Ambulatory Visit (INDEPENDENT_AMBULATORY_CARE_PROVIDER_SITE_OTHER): Payer: Medicare Other | Admitting: Internal Medicine

## 2016-04-17 VITALS — BP 140/76 | HR 56 | Temp 97.2°F | Resp 16 | Ht 67.5 in | Wt 204.8 lb

## 2016-04-17 DIAGNOSIS — R7309 Other abnormal glucose: Secondary | ICD-10-CM

## 2016-04-17 DIAGNOSIS — E782 Mixed hyperlipidemia: Secondary | ICD-10-CM | POA: Diagnosis not present

## 2016-04-17 DIAGNOSIS — R7303 Prediabetes: Secondary | ICD-10-CM | POA: Diagnosis not present

## 2016-04-17 DIAGNOSIS — Z136 Encounter for screening for cardiovascular disorders: Secondary | ICD-10-CM | POA: Diagnosis not present

## 2016-04-17 DIAGNOSIS — Z79899 Other long term (current) drug therapy: Secondary | ICD-10-CM

## 2016-04-17 DIAGNOSIS — Z0001 Encounter for general adult medical examination with abnormal findings: Secondary | ICD-10-CM | POA: Diagnosis not present

## 2016-04-17 DIAGNOSIS — R6889 Other general symptoms and signs: Secondary | ICD-10-CM | POA: Diagnosis not present

## 2016-04-17 DIAGNOSIS — G40909 Epilepsy, unspecified, not intractable, without status epilepticus: Secondary | ICD-10-CM

## 2016-04-17 DIAGNOSIS — I10 Essential (primary) hypertension: Secondary | ICD-10-CM | POA: Diagnosis not present

## 2016-04-17 DIAGNOSIS — N320 Bladder-neck obstruction: Secondary | ICD-10-CM

## 2016-04-17 DIAGNOSIS — E559 Vitamin D deficiency, unspecified: Secondary | ICD-10-CM

## 2016-04-17 DIAGNOSIS — Z1212 Encounter for screening for malignant neoplasm of rectum: Secondary | ICD-10-CM

## 2016-04-17 DIAGNOSIS — I25810 Atherosclerosis of coronary artery bypass graft(s) without angina pectoris: Secondary | ICD-10-CM

## 2016-04-17 DIAGNOSIS — Z125 Encounter for screening for malignant neoplasm of prostate: Secondary | ICD-10-CM

## 2016-04-17 LAB — BASIC METABOLIC PANEL WITH GFR
BUN: 15 mg/dL (ref 7–25)
CHLORIDE: 102 mmol/L (ref 98–110)
CO2: 27 mmol/L (ref 20–31)
CREATININE: 1.06 mg/dL (ref 0.70–1.25)
Calcium: 9.4 mg/dL (ref 8.6–10.3)
GFR, Est African American: 84 mL/min (ref >=60)
GFR, Est Non African American: 72 mL/min (ref >=60)
Glucose, Bld: 101 mg/dL — ABNORMAL HIGH (ref 65–99)
Potassium: 4.1 mmol/L (ref 3.5–5.3)
SODIUM: 139 mmol/L (ref 135–146)

## 2016-04-17 LAB — LIPID PANEL
CHOL/HDL RATIO: 2.9 ratio (ref <5.0)
Cholesterol: 140 mg/dL (ref <200)
HDL: 48 mg/dL (ref >40)
LDL Cholesterol: 71 mg/dL (ref <100)
Triglycerides: 103 mg/dL (ref <150)
VLDL: 21 mg/dL (ref <30)

## 2016-04-17 LAB — CBC WITH DIFFERENTIAL/PLATELET
BASOS ABS: 44 {cells}/uL (ref 0–200)
Basophils Relative: 1 %
EOS ABS: 88 {cells}/uL (ref 15–500)
EOS PCT: 2 %
HEMATOCRIT: 45.8 % (ref 38.5–50.0)
HEMOGLOBIN: 15.6 g/dL (ref 13.2–17.1)
LYMPHS ABS: 1584 {cells}/uL (ref 850–3900)
Lymphocytes Relative: 36 %
MCH: 31.4 pg (ref 27.0–33.0)
MCHC: 34.1 g/dL (ref 32.0–36.0)
MCV: 92.2 fL (ref 80.0–100.0)
MONO ABS: 396 {cells}/uL (ref 200–950)
MPV: 9.6 fL (ref 7.5–12.5)
Monocytes Relative: 9 %
NEUTROS ABS: 2288 {cells}/uL (ref 1500–7800)
Neutrophils Relative %: 52 %
Platelets: 171 10*3/uL (ref 140–400)
RBC: 4.97 MIL/uL (ref 4.20–5.80)
RDW: 13.4 % (ref 11.0–15.0)
WBC: 4.4 10*3/uL (ref 3.8–10.8)

## 2016-04-17 LAB — HEMOGLOBIN A1C
HEMOGLOBIN A1C: 5.4 % (ref <5.7)
Mean Plasma Glucose: 108 mg/dL

## 2016-04-17 LAB — HEPATIC FUNCTION PANEL
ALT: 18 U/L (ref 9–46)
AST: 21 U/L (ref 10–35)
Albumin: 4.4 g/dL (ref 3.6–5.1)
Alkaline Phosphatase: 64 U/L (ref 40–115)
BILIRUBIN DIRECT: 0.1 mg/dL (ref <=0.2)
BILIRUBIN INDIRECT: 0.3 mg/dL (ref 0.2–1.2)
Total Bilirubin: 0.4 mg/dL (ref 0.2–1.2)
Total Protein: 7.2 g/dL (ref 6.1–8.1)

## 2016-04-17 LAB — MAGNESIUM: MAGNESIUM: 1.9 mg/dL (ref 1.5–2.5)

## 2016-04-17 LAB — TSH: TSH: 3.32 m[IU]/L (ref 0.40–4.50)

## 2016-04-17 NOTE — Patient Instructions (Signed)

## 2016-04-18 LAB — VITAMIN D 25 HYDROXY (VIT D DEFICIENCY, FRACTURES): Vit D, 25-Hydroxy: 76 ng/mL (ref 30–100)

## 2016-04-18 LAB — URINALYSIS, ROUTINE W REFLEX MICROSCOPIC
BILIRUBIN URINE: NEGATIVE
GLUCOSE, UA: NEGATIVE
Hgb urine dipstick: NEGATIVE
Ketones, ur: NEGATIVE
Leukocytes, UA: NEGATIVE
Nitrite: NEGATIVE
PH: 5.5 (ref 5.0–8.0)
Protein, ur: NEGATIVE
SPECIFIC GRAVITY, URINE: 1.013 (ref 1.001–1.035)

## 2016-04-18 LAB — PSA: PSA: 0.4 ng/mL (ref <=4.0)

## 2016-04-18 LAB — INSULIN, RANDOM: Insulin: 8.2 u[IU]/mL (ref 2.0–19.6)

## 2016-04-18 LAB — CARBAMAZEPINE LEVEL, TOTAL: Carbamazepine Lvl: 6.6 mg/L (ref 4.0–12.0)

## 2016-04-18 LAB — MICROALBUMIN / CREATININE URINE RATIO
Creatinine, Urine: 66 mg/dL (ref 20–370)
Microalb Creat Ratio: 3 mcg/mg creat (ref <30)
Microalb, Ur: 0.2 mg/dL

## 2016-06-07 ENCOUNTER — Other Ambulatory Visit: Payer: Self-pay | Admitting: Physician Assistant

## 2016-06-07 ENCOUNTER — Other Ambulatory Visit: Payer: Self-pay | Admitting: Internal Medicine

## 2016-06-07 DIAGNOSIS — I10 Essential (primary) hypertension: Secondary | ICD-10-CM

## 2016-06-07 DIAGNOSIS — G40909 Epilepsy, unspecified, not intractable, without status epilepticus: Secondary | ICD-10-CM

## 2016-08-28 ENCOUNTER — Encounter: Payer: Self-pay | Admitting: Internal Medicine

## 2016-08-28 ENCOUNTER — Ambulatory Visit (INDEPENDENT_AMBULATORY_CARE_PROVIDER_SITE_OTHER): Payer: Medicare Other | Admitting: Internal Medicine

## 2016-08-28 VITALS — BP 140/76 | HR 52 | Temp 98.0°F | Resp 16 | Ht 67.5 in | Wt 206.0 lb

## 2016-08-28 DIAGNOSIS — R7303 Prediabetes: Secondary | ICD-10-CM | POA: Diagnosis not present

## 2016-08-28 DIAGNOSIS — E782 Mixed hyperlipidemia: Secondary | ICD-10-CM | POA: Diagnosis not present

## 2016-08-28 DIAGNOSIS — I10 Essential (primary) hypertension: Secondary | ICD-10-CM | POA: Diagnosis not present

## 2016-08-28 DIAGNOSIS — E559 Vitamin D deficiency, unspecified: Secondary | ICD-10-CM

## 2016-08-28 DIAGNOSIS — Z1159 Encounter for screening for other viral diseases: Secondary | ICD-10-CM | POA: Diagnosis not present

## 2016-08-28 DIAGNOSIS — Z79899 Other long term (current) drug therapy: Secondary | ICD-10-CM

## 2016-08-28 LAB — CBC WITH DIFFERENTIAL/PLATELET
Basophils Absolute: 38 cells/uL (ref 0–200)
Basophils Relative: 1 %
Eosinophils Absolute: 38 cells/uL (ref 15–500)
Eosinophils Relative: 1 %
HCT: 42.8 % (ref 38.5–50.0)
Hemoglobin: 14.8 g/dL (ref 13.2–17.1)
Lymphocytes Relative: 42 %
Lymphs Abs: 1596 cells/uL (ref 850–3900)
MCH: 31.5 pg (ref 27.0–33.0)
MCHC: 34.6 g/dL (ref 32.0–36.0)
MCV: 91.1 fL (ref 80.0–100.0)
MPV: 9.5 fL (ref 7.5–12.5)
Monocytes Absolute: 342 cells/uL (ref 200–950)
Monocytes Relative: 9 %
Neutro Abs: 1786 cells/uL (ref 1500–7800)
Neutrophils Relative %: 47 %
Platelets: 156 10*3/uL (ref 140–400)
RBC: 4.7 MIL/uL (ref 4.20–5.80)
RDW: 13.4 % (ref 11.0–15.0)
WBC: 3.8 10*3/uL (ref 3.8–10.8)

## 2016-08-28 LAB — HEPATITIS C ANTIBODY: HCV Ab: NEGATIVE

## 2016-08-28 LAB — COMPREHENSIVE METABOLIC PANEL
ALT: 18 U/L (ref 9–46)
AST: 24 U/L (ref 10–35)
Albumin: 4.3 g/dL (ref 3.6–5.1)
Alkaline Phosphatase: 63 U/L (ref 40–115)
BUN: 18 mg/dL (ref 7–25)
CO2: 28 mmol/L (ref 20–31)
Calcium: 9 mg/dL (ref 8.6–10.3)
Chloride: 103 mmol/L (ref 98–110)
Creat: 1 mg/dL (ref 0.70–1.25)
Glucose, Bld: 100 mg/dL — ABNORMAL HIGH (ref 65–99)
Potassium: 4.3 mmol/L (ref 3.5–5.3)
Sodium: 140 mmol/L (ref 135–146)
Total Bilirubin: 0.4 mg/dL (ref 0.2–1.2)
Total Protein: 6.8 g/dL (ref 6.1–8.1)

## 2016-08-28 NOTE — Progress Notes (Signed)
Assessment and Plan:  Hypertension:  -Continue medication,  -well controlled -monitor blood pressure at home.  -Continue DASH diet.   -Reminder to go to the ER if any CP, SOB, nausea, dizziness, severe HA, changes vision/speech, left arm numbness and tingling, and jaw pain.  Cholesterol: -cont medication -will not check level today as has been well controlled.  Can change to twice yearly cholesterol checks -Continue diet and exercise.  -check LFTs today  Pre-diabetes: -Continue diet and exercise.  -A1c have been well controlled -no need to check today  Vitamin D Def: -continue medications.   Seizure disorder -cont meds -no recent activity in the last 8 years  Continue diet and meds as discussed. Further disposition pending results of labs.  HPI 66 y.o. male  presents for 3 month follow up with hypertension, hyperlipidemia, prediabetes and vitamin D.   His blood pressure has been controlled at home, today their BP is BP: 140/76.   He does not workout. He denies chest pain, shortness of breath, dizziness.  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  He is enjoying being back at work. He is not exercising much because of this.  He has not recently had any seizures.  He reports that it has been 8 years since his last seizure. He takes his medications regularly.   He is on cholesterol medication and denies myalgias. His cholesterol is at goal. The cholesterol last visit was:   Lab Results  Component Value Date   CHOL 140 04/17/2016   HDL 48 04/17/2016   LDLCALC 71 04/17/2016   TRIG 103 04/17/2016   CHOLHDL 2.9 04/17/2016     He has been working on diet and exercise for prediabetes, and denies foot ulcerations, hyperglycemia, hypoglycemia , increased appetite, nausea, paresthesia of the feet, polydipsia, polyuria, visual disturbances, vomiting and weight loss. Last A1C in the office was:  Lab Results  Component Value Date   HGBA1C  5.4 04/17/2016    Patient is on Vitamin D supplement.  Lab Results  Component Value Date   VD25OH 76 04/17/2016       Current Medications:  Current Outpatient Prescriptions on File Prior to Visit  Medication Sig Dispense Refill  . aspirin 81 MG tablet Take 81 mg by mouth daily.      . carbamazepine (CARBATROL) 300 MG 12 hr capsule TAKE 1 CAPSULE BY MOUTH TWO TIMES DAILY 180 capsule 1  . Cholecalciferol (VITAMIN D PO) Take 10,000 Units by mouth daily.      . fish oil-omega-3 fatty acids 1000 MG capsule Take 1 g by mouth 2 (two) times daily.     . LUTEIN PO Take by mouth daily.    . Magnesium 250 MG TABS Take 250 mg by mouth daily.    . metoprolol succinate (TOPROL-XL) 100 MG 24 hr tablet TAKE 1 TABLET BY MOUTH ONCE DAILY FOR BLOOD PRESSURE 90 tablet 1  . rosuvastatin (CRESTOR) 40 MG tablet TAKE 1 TABLET BY MOUTH  DAILY 90 tablet 1   No current facility-administered medications on file prior to visit.     Medical History:  Past Medical History:  Diagnosis Date  . Bilateral carpal tunnel syndrome   . Dyslipidemia   . Hyperlipidemia   . Ischemic heart disease   . Plantar fasciitis   . Seizure disorder ([place])   . Vitamin D deficiency     Allergies:  Allergies  Allergen Reactions  . Lipitor [Atorvastatin]  myalgia  . Niacin And Related Other (See Comments)    Pt unaware of this intolerance (10/31/13)     Review of Systems:  Review of Systems  Constitutional: Negative for chills, fever and malaise/fatigue.  HENT: Negative for congestion, ear pain and sore throat.   Eyes: Negative.   Respiratory: Negative for cough, shortness of breath and wheezing.   Cardiovascular: Negative for chest pain, palpitations and leg swelling.  Gastrointestinal: Negative for abdominal pain, blood in stool, constipation, diarrhea, heartburn and melena.  Genitourinary: Negative.   Skin: Negative.   Neurological: Negative for dizziness, sensory change, loss of consciousness and headaches.   Psychiatric/Behavioral: Negative for depression. The patient is not nervous/anxious and does not have insomnia.     Family history- Review and unchanged  Social history- Review and unchanged  Physical Exam: BP 140/76   Pulse (!) 52   Temp 98 F (36.7 C) (Temporal)   Resp 16   Ht 5' 7.5" (1.715 m)   Wt 206 lb (93.4 kg)   BMI 31.79 kg/m  Wt Readings from Last 3 Encounters:  08/28/16 206 lb (93.4 kg)  04/17/16 204 lb 12.8 oz (92.9 kg)  12/17/15 205 lb 9.6 oz (93.3 kg)    General Appearance: Well nourished well developed, in no apparent distress. Eyes: PERRLA, EOMs, conjunctiva no swelling or erythema ENT/Mouth: Ear canals normal without obstruction, swelling, erythma, discharge.  TMs normal bilaterally.  Oropharynx moist, clear, without exudate, or postoropharyngeal swelling. Neck: Supple, thyroid normal,no cervical adenopathy  Respiratory: Respiratory effort normal, Breath sounds clear A&P without rhonchi, wheeze, or rale.  No retractions, no accessory usage. Cardio: RRR with no MRGs. Brisk peripheral pulses without edema.  Abdomen: Soft, + BS,  Non tender, no guarding, rebound, hernias, masses. Musculoskeletal: Full ROM, 5/5 strength, Normal gait Skin: Warm, dry without rashes, lesions, ecchymosis.  Neuro: Awake and oriented X 3, Cranial nerves intact. Normal muscle tone, no cerebellar symptoms. Psych: Normal affect, Insight and Judgment appropriate.    [name], PA-C 10:36 AM [facility]

## 2016-11-29 ENCOUNTER — Ambulatory Visit: Payer: Self-pay | Admitting: Internal Medicine

## 2017-01-23 ENCOUNTER — Other Ambulatory Visit: Payer: Self-pay | Admitting: Internal Medicine

## 2017-01-23 DIAGNOSIS — I10 Essential (primary) hypertension: Secondary | ICD-10-CM

## 2017-03-02 DIAGNOSIS — H31001 Unspecified chorioretinal scars, right eye: Secondary | ICD-10-CM | POA: Diagnosis not present

## 2017-03-02 DIAGNOSIS — H2513 Age-related nuclear cataract, bilateral: Secondary | ICD-10-CM | POA: Diagnosis not present

## 2017-04-30 ENCOUNTER — Other Ambulatory Visit: Payer: Self-pay | Admitting: Internal Medicine

## 2017-04-30 DIAGNOSIS — G40909 Epilepsy, unspecified, not intractable, without status epilepticus: Secondary | ICD-10-CM

## 2017-05-10 ENCOUNTER — Encounter: Payer: Self-pay | Admitting: Internal Medicine

## 2017-05-10 NOTE — Patient Instructions (Signed)

## 2017-05-10 NOTE — Progress Notes (Signed)
[facility]   [name], M.D.     Uvaldo Bristle. Silverio Lay, P.A.-C Liane Comber, [place]                [address] [place] [address] Telephone [phone] Telefax [phone] Annual  Screening/Preventative Visit  & Comprehensive Evaluation & Examination     This very nice 66 y.o. MWM presents for a Screening/Preventative Visit & comprehensive evaluation and management of multiple medical co-morbidities.  Patient has been followed for HTN, Prediabetes, Hyperlipidemia and Vitamin D Deficiency.  Patient has hx/o atypical temporal lobe seizures (1997) manifest with a fugue state, but no major motor activity and it has been years since his last seizure.     HTN predates since 2004. Patient's BP has been controlled at home.  Today's BP is at goal - 136/84. In 2008, patient underwent an emergent CABG and has done well since. Patient denies any cardiac symptoms as chest pain, palpitations, shortness of breath, dizziness or ankle swelling.     Patient's hyperlipidemia is controlled with diet and medications. Patient denies myalgias or other medication SE's. Last lipids were at goal: Lab Results  Component Value Date   CHOL 140 04/17/2016   HDL 48 04/17/2016   LDLCALC 71 04/17/2016   TRIG 103 04/17/2016   CHOLHDL 2.9 04/17/2016      Patient has prediabetes ("A1c 5.7% / 2014)  and patient denies reactive hypoglycemic symptoms, visual blurring, diabetic polys or paresthesias. Last A1c was normal at goal:   Lab Results  Component Value Date   HGBA1C 5.4 04/17/2016       Finally, patient has history of Vitamin D Deficiency ("24" / 2008) and last vitamin D was at goal: Lab Results  Component Value Date   VD25OH 76 04/17/2016   Current Outpatient Medications on File Prior to Visit  Medication Sig  . aspirin 81 MG tablet Take 81 mg by mouth daily.    . carbamazepine (CARBATROL) 300 MG 12 hr  capsule TAKE 1 CAPSULE BY MOUTH TWO TIMES DAILY  . Cholecalciferol (VITAMIN D PO) Take 10,000 Units by mouth daily.    . fish oil-omega-3 fatty acids 1000 MG capsule Take 1 g by mouth 2 (two) times daily.   . LUTEIN PO Take by mouth daily.  . Magnesium 250 MG TABS Take 250 mg by mouth daily.  . metoprolol succinate (TOPROL-XL) 100 MG 24 hr tablet TAKE 1 TABLET BY MOUTH ONCE DAILY FOR BLOOD PRESSURE  . rosuvastatin (CRESTOR) 40 MG tablet TAKE 1 TABLET BY MOUTH  DAILY   No current facility-administered medications on file prior to visit.    Allergies  Allergen Reactions  . Lipitor [Atorvastatin]     myalgia  . Niacin And Related Other (See Comments)    Pt unaware of this intolerance (10/31/13)   Past Medical History:  Diagnosis Date  . Bilateral carpal tunnel syndrome   . Dyslipidemia   . Hyperlipidemia   . Ischemic heart disease   . Plantar fasciitis   . Seizure disorder ([place])   . Vitamin D deficiency    Health Maintenance  Topic Date Due  . PNA vac Low Risk Adult (2 of 2 - PPSV23) 12/16/2016  . INFLUENZA VACCINE  12/20/2016  . Fecal DNA (Cologuard)  03/17/2018  . TETANUS/TDAP  04/23/2019  . Hepatitis C Screening  Completed   Immunization History  Administered Date(s) Administered  . Influenza, High Dose Seasonal PF 02/23/2015  . Pneumococcal Conjugate-13 12/17/2015  . Pneumococcal Polysaccharide-23 04/22/2009  . Td 04/22/2009  . Zoster 09/09/2009   Last Colon -  Past Surgical History:  Procedure Laterality Date  . CHOLECYSTECTOMY    . CORONARY ARTERY BYPASS GRAFT  08/2006  . HAND SURGERY     Family History  Problem Relation Age of Onset  . Hypertension Mother   . Stroke Mother   . Diabetes Father   . Lupus Sister    Socioeconomic History  . Marital status: Married    Spouse name: Not on file  . Number of children: 2  Tobacco Use  . Smoking status: Former Smoker    Types: Cigarettes    Last attempt to quit: 05/22/1968    Years since quitting: 49.0  .  Smokeless tobacco: Never Used  Substance and Sexual Activity  . Alcohol use: No    Alcohol/week: 0.0 oz    Comment: very rarely  . Drug use: No  . Sexual activity: Not on file  Social History Narrative   Patient lives at home with his wife Vaughan Basta)  [employer]a Lente and retired. Patient has two children.    ROS Constitutional: Denies fever, chills, weight loss/gain, headaches, insomnia,  night sweats or change in appetite. Does c/o fatigue. Eyes: Denies redness, blurred vision, diplopia, discharge, itchy or watery eyes.  ENT: Denies discharge, congestion, post nasal drip, epistaxis, sore throat, earache, hearing loss, dental pain, Tinnitus, Vertigo, Sinus pain or snoring.  Cardio: Denies chest pain, palpitations, irregular heartbeat, syncope, dyspnea, diaphoresis, orthopnea, PND, claudication or edema Respiratory: denies cough, dyspnea, DOE, pleurisy, hoarseness, laryngitis or wheezing.  Gastrointestinal: Denies dysphagia, heartburn, reflux, water brash, pain, cramps, nausea, vomiting, bloating, diarrhea, constipation, hematemesis, melena, hematochezia, jaundice or hemorrhoids Genitourinary: Denies dysuria, discharge, hematuria or flank pain, but he admits occasional frequency, urgency, nocturia and  Hesitancy. Musculoskeletal: Denies arthralgia, myalgia, stiffness, Jt. Swelling, pain, limp or strain/sprain. Denies Falls. Skin: Denies puritis, rash, hives, warts, acne, eczema or change in skin lesion Neuro: No weakness, tremor, incoordination, spasms, paresthesia or pain Psychiatric: Denies confusion, memory loss or sensory loss. Denies Depression. Endocrine: Denies change in weight, skin, hair change, nocturia, and paresthesia, diabetic polys, visual blurring or hyper / hypo glycemic episodes.  Heme/Lymph: No excessive bleeding, bruising or enlarged lymph nodes.  Physical Exam  BP 136/84   Pulse 60   Temp (!) 97.5 F (36.4 C)   Resp 18   Ht 5' 8.5" (1.74 m)   Wt 208 lb 12.8 oz (94.7 kg)    BMI 31.29 kg/m   General Appearance: Well nourished and well groomed and in no apparent distress.  Eyes: PERRLA, EOMs, conjunctiva no swelling or erythema, normal fundi and vessels. Sinuses: No frontal/maxillary tenderness ENT/Mouth: EACs patent / TMs  nl. Nares clear without erythema, swelling, mucoid exudates. Oral hygiene is good. No erythema, swelling, or exudate. Tongue normal, non-obstructing. Tonsils not swollen or erythematous. Hearing normal.  Neck: Supple, thyroid normal. No bruits, nodes or JVD. Respiratory: Respiratory effort normal.  BS equal and clear bilateral without rales, rhonci, wheezing or stridor. Cardio: Heart sounds are normal with regular rate and rhythm and no murmurs, rubs or gallops. Peripheral pulses are normal and equal bilaterally without edema. No aortic or femoral bruits. Chest:  Median sternotomy scar.  Symmetric with normal excursions and percussion.  Abdomen: Soft, with Nl bowel sounds. Nontender, no guarding, rebound, hernias, masses, or organomegaly.  Lymphatics: Non tender without lymphadenopathy.  Genitourinary: No hernias.Testes nl. DRE - prostate nl for age - smooth & firm w/o nodules. Musculoskeletal: Full ROM all peripheral extremities, joint stability, 5/5 strength, and normal gait. Skin: Warm and dry without rashes, lesions, cyanosis, clubbing or  ecchymosis.  Neuro: Cranial nerves intact, reflexes equal bilaterally. Normal muscle tone, no cerebellar symptoms. Sensation intact.  Pysch: Alert and oriented X 3 with normal affect, insight and judgment appropriate.   Assessment and Plan  1. Annual Preventative/Screening Exam   2. Essential hypertension  - EKG 12-Lead - [place], RETROPERITNL ABD,  LTD - Urinalysis, Routine w reflex microscopic - Microalbumin / creatinine urine ratio - CBC with Differential/Platelet - BASIC METABOLIC PANEL WITH GFR - Magnesium - TSH  3. Hyperlipidemia, mixed - EKG 12-Lead - [place], RETROPERITNL ABD,  LTD - Hepatic  function panel - Lipid panel - TSH  4. Prediabetes  - EKG 12-Lead - [place], RETROPERITNL ABD,  LTD - Hemoglobin A1c - Insulin, random  5. Vitamin D deficiency  - VITAMIN D 25 Hydroxy  6. Abnormal glucose  - Hemoglobin A1c - Insulin, random  7. Coronary artery disease involving coronary bypass graft of native heart without angina pectoris  - EKG 12-Lead - Lipid panel  8. Seizure disorder ([facility])  - Carbamazepine level, total  9. Screening for colorectal cancer  - POC Hemoccult Bld/Stl (3-Cd Home Screen)  10. Prostate cancer screening  - PSA  11. Screening for ischemic heart disease  - EKG 12-Lead  12. Screening for AAA (aortic abdominal aneurysm)  - [place], RETROPERITNL ABD,  LTD  13. Smoker  - EKG 12-Lead - [place], RETROPERITNL ABD,  LTD  14. Medication management  - Urinalysis, Routine w reflex microscopic - Microalbumin / creatinine urine ratio - CBC with Differential/Platelet - BASIC METABOLIC PANEL WITH GFR - Hepatic function panel - Magnesium - Lipid panel - TSH - Hemoglobin A1c - Insulin, random - VITAMIN D 25 Hydroxy - Carbamazepine level, total  15. Benign localized prostatic hyperplasia with lower urinary tract symptoms (LUTS)  - PSA      Patient was counseled in prudent diet, weight control to achieve/maintain BMI less than 25, BP monitoring, regular exercise and medications as discussed.  Discussed med effects and SE's. Routine screening labs and tests as requested with regular follow-up as recommended. Over 40 minutes of exam, counseling, chart review and high complex critical decision making was performed

## 2017-05-11 ENCOUNTER — Ambulatory Visit (INDEPENDENT_AMBULATORY_CARE_PROVIDER_SITE_OTHER): Payer: Medicare Other | Admitting: Internal Medicine

## 2017-05-11 VITALS — BP 136/84 | HR 60 | Temp 97.5°F | Resp 18 | Ht 68.5 in | Wt 208.8 lb

## 2017-05-11 DIAGNOSIS — N401 Enlarged prostate with lower urinary tract symptoms: Secondary | ICD-10-CM

## 2017-05-11 DIAGNOSIS — R7309 Other abnormal glucose: Secondary | ICD-10-CM

## 2017-05-11 DIAGNOSIS — Z0000 Encounter for general adult medical examination without abnormal findings: Secondary | ICD-10-CM | POA: Diagnosis not present

## 2017-05-11 DIAGNOSIS — I10 Essential (primary) hypertension: Secondary | ICD-10-CM

## 2017-05-11 DIAGNOSIS — E559 Vitamin D deficiency, unspecified: Secondary | ICD-10-CM

## 2017-05-11 DIAGNOSIS — R7303 Prediabetes: Secondary | ICD-10-CM

## 2017-05-11 DIAGNOSIS — I25810 Atherosclerosis of coronary artery bypass graft(s) without angina pectoris: Secondary | ICD-10-CM

## 2017-05-11 DIAGNOSIS — Z125 Encounter for screening for malignant neoplasm of prostate: Secondary | ICD-10-CM | POA: Diagnosis not present

## 2017-05-11 DIAGNOSIS — Z136 Encounter for screening for cardiovascular disorders: Secondary | ICD-10-CM

## 2017-05-11 DIAGNOSIS — Z1212 Encounter for screening for malignant neoplasm of rectum: Secondary | ICD-10-CM

## 2017-05-11 DIAGNOSIS — Z79899 Other long term (current) drug therapy: Secondary | ICD-10-CM

## 2017-05-11 DIAGNOSIS — F17200 Nicotine dependence, unspecified, uncomplicated: Secondary | ICD-10-CM

## 2017-05-11 DIAGNOSIS — E782 Mixed hyperlipidemia: Secondary | ICD-10-CM | POA: Diagnosis not present

## 2017-05-11 DIAGNOSIS — Z0001 Encounter for general adult medical examination with abnormal findings: Secondary | ICD-10-CM

## 2017-05-11 DIAGNOSIS — Z1211 Encounter for screening for malignant neoplasm of colon: Secondary | ICD-10-CM

## 2017-05-11 DIAGNOSIS — G40909 Epilepsy, unspecified, not intractable, without status epilepticus: Secondary | ICD-10-CM

## 2017-05-13 ENCOUNTER — Other Ambulatory Visit: Payer: Self-pay | Admitting: Internal Medicine

## 2017-05-13 DIAGNOSIS — E039 Hypothyroidism, unspecified: Secondary | ICD-10-CM

## 2017-05-14 ENCOUNTER — Encounter: Payer: Self-pay | Admitting: Internal Medicine

## 2017-05-14 LAB — CBC WITH DIFFERENTIAL/PLATELET
BASOS ABS: 28 {cells}/uL (ref 0–200)
BASOS PCT: 0.7 %
EOS ABS: 68 {cells}/uL (ref 15–500)
Eosinophils Relative: 1.7 %
HCT: 43.7 % (ref 38.5–50.0)
HEMOGLOBIN: 15 g/dL (ref 13.2–17.1)
Lymphs Abs: 1388 cells/uL (ref 850–3900)
MCH: 31.3 pg (ref 27.0–33.0)
MCHC: 34.3 g/dL (ref 32.0–36.0)
MCV: 91 fL (ref 80.0–100.0)
MPV: 10.1 fL (ref 7.5–12.5)
Monocytes Relative: 10.2 %
NEUTROS ABS: 2108 {cells}/uL (ref 1500–7800)
Neutrophils Relative %: 52.7 %
Platelets: 171 10*3/uL (ref 140–400)
RBC: 4.8 10*6/uL (ref 4.20–5.80)
RDW: 12.2 % (ref 11.0–15.0)
Total Lymphocyte: 34.7 %
WBC: 4 10*3/uL (ref 3.8–10.8)
WBCMIX: 408 {cells}/uL (ref 200–950)

## 2017-05-14 LAB — VITAMIN D 25 HYDROXY (VIT D DEFICIENCY, FRACTURES): Vit D, 25-Hydroxy: 69 ng/mL (ref 30–100)

## 2017-05-14 LAB — LIPID PANEL
CHOL/HDL RATIO: 3.6 (calc) (ref <5.0)
CHOLESTEROL: 174 mg/dL (ref <200)
HDL: 49 mg/dL (ref >40)
LDL CHOLESTEROL (CALC): 99 mg/dL
NON-HDL CHOLESTEROL (CALC): 125 mg/dL (ref <130)
Triglycerides: 165 mg/dL — ABNORMAL HIGH (ref <150)

## 2017-05-14 LAB — INSULIN, RANDOM: INSULIN: 12.9 u[IU]/mL (ref 2.0–19.6)

## 2017-05-14 LAB — URINALYSIS, ROUTINE W REFLEX MICROSCOPIC
Bilirubin Urine: NEGATIVE
GLUCOSE, UA: NEGATIVE
HGB URINE DIPSTICK: NEGATIVE
KETONES UR: NEGATIVE
LEUKOCYTES UA: NEGATIVE
Nitrite: NEGATIVE
PH: 7 (ref 5.0–8.0)
Protein, ur: NEGATIVE
SPECIFIC GRAVITY, URINE: 1.016 (ref 1.001–1.03)

## 2017-05-14 LAB — MICROALBUMIN / CREATININE URINE RATIO
Creatinine, Urine: 110 mg/dL (ref 20–320)
MICROALB/CREAT RATIO: 3 ug/mg{creat} (ref <30)
Microalb, Ur: 0.3 mg/dL

## 2017-05-14 LAB — HEPATIC FUNCTION PANEL
AG RATIO: 1.5 (calc) (ref 1.0–2.5)
ALT: 16 U/L (ref 9–46)
AST: 19 U/L (ref 10–35)
Albumin: 4.3 g/dL (ref 3.6–5.1)
Alkaline phosphatase (APISO): 59 U/L (ref 40–115)
BILIRUBIN DIRECT: 0.1 mg/dL (ref 0.0–0.2)
BILIRUBIN INDIRECT: 0.4 mg/dL (ref 0.2–1.2)
BILIRUBIN TOTAL: 0.5 mg/dL (ref 0.2–1.2)
GLOBULIN: 2.8 g/dL (ref 1.9–3.7)
TOTAL PROTEIN: 7.1 g/dL (ref 6.1–8.1)

## 2017-05-14 LAB — BASIC METABOLIC PANEL WITH GFR
BUN: 19 mg/dL (ref 7–25)
CO2: 29 mmol/L (ref 20–32)
CREATININE: 1.13 mg/dL (ref 0.70–1.25)
Calcium: 9.6 mg/dL (ref 8.6–10.3)
Chloride: 100 mmol/L (ref 98–110)
GFR, EST AFRICAN AMERICAN: 77 mL/min/{1.73_m2} (ref >=60)
GFR, EST NON AFRICAN AMERICAN: 66 mL/min/{1.73_m2} (ref >=60)
Glucose, Bld: 109 mg/dL — ABNORMAL HIGH (ref 65–99)
Potassium: 4.4 mmol/L (ref 3.5–5.3)
SODIUM: 138 mmol/L (ref 135–146)

## 2017-05-14 LAB — HEMOGLOBIN A1C
EAG (MMOL/L): 6.5 (calc)
HEMOGLOBIN A1C: 5.7 %{Hb} — AB (ref <5.7)
MEAN PLASMA GLUCOSE: 117 (calc)

## 2017-05-14 LAB — PSA: PSA: 0.4 ng/mL (ref <=4.0)

## 2017-05-14 LAB — CARBAMAZEPINE LEVEL, TOTAL: Carbamazepine Lvl: 6 mg/L (ref 4.0–12.0)

## 2017-05-14 LAB — MAGNESIUM: Magnesium: 1.9 mg/dL (ref 1.5–2.5)

## 2017-05-14 LAB — TSH: TSH: 4.83 mIU/L — ABNORMAL HIGH (ref 0.40–4.50)

## 2017-07-30 ENCOUNTER — Encounter: Payer: Self-pay | Admitting: Adult Health

## 2017-07-30 ENCOUNTER — Ambulatory Visit (INDEPENDENT_AMBULATORY_CARE_PROVIDER_SITE_OTHER): Payer: Medicare Other | Admitting: Adult Health

## 2017-07-30 VITALS — BP 156/74 | HR 55 | Temp 97.3°F | Ht 68.5 in | Wt 209.0 lb

## 2017-07-30 DIAGNOSIS — J209 Acute bronchitis, unspecified: Secondary | ICD-10-CM | POA: Diagnosis not present

## 2017-07-30 MED ORDER — AZITHROMYCIN 250 MG PO TABS: ORAL_TABLET | ORAL | 1 refills | Status: AC

## 2017-07-30 MED ORDER — PREDNISONE 20 MG PO TABS: ORAL_TABLET | ORAL | 0 refills | Status: DC

## 2017-07-30 MED ORDER — PROMETHAZINE-DM 6.25-15 MG/5ML PO SYRP: 5.0000 mL | ORAL_SOLUTION | Freq: Four times a day (QID) | ORAL | 1 refills | Status: DC | PRN

## 2017-07-30 MED ORDER — BENZONATATE 100 MG PO CAPS: 200.0000 mg | ORAL_CAPSULE | Freq: Three times a day (TID) | ORAL | 0 refills | Status: DC | PRN

## 2017-07-30 NOTE — Patient Instructions (Signed)

## 2017-07-30 NOTE — Progress Notes (Signed)
Assessment and Plan:  [name] was seen today for uri.  Diagnoses and all orders for this visit:  Acute bronchitis, unspecified organism Suggested symptomatic OTC remedies. Nasal saline spray for congestion. Nasal steroids, allergy pill, oral steroids Follow up as needed. -     azithromycin (ZITHROMAX) 250 MG tablet; Take 2 tablets (500 mg) on  Day 1,  followed by 1 tablet (250 mg) once daily on Days 2 through 5. -     predniSONE (DELTASONE) 20 MG tablet; 2 tablets daily for 3 days, 1 tablet daily for 4 days. -     benzonatate (TESSALON PERLES) 100 MG capsule; Take 2 capsules (200 mg total) by mouth 3 (three) times daily as needed for cough (Max: 600mg  per day). -     promethazine-dextromethorphan (PROMETHAZINE-DM) 6.25-15 MG/5ML syrup; Take 5 mLs by mouth 4 (four) times daily as needed for cough.  Go to the ER if any chest pain, shortness of breath, nausea, dizziness, severe HA, changes vision/speech  Further disposition pending results of labs. Discussed med's effects and SE's.   Over 15 minutes of exam, counseling, chart review, and critical decision making was performed.   Future Appointments  Date Time Provider [place]  08/13/2017  9:30 AM Liane Comber, NP [facility] None  12/11/2017  9:30 AM [name], MD [facility] None  06/10/2018 10:00 AM [name], MD [facility] None    ------------------------------------------------------------------------------------------------------------------   HPI BP (!) 156/74   Pulse (!) 55   Temp (!) 97.3 F (36.3 C)   Ht 5' 8.5" (1.74 m)   Wt 209 lb (94.8 kg)   SpO2 97%   BMI 31.32 kg/m   67 y.o.male presents for productive cough (clear secretions), chest congestion, nasal congestion, sore throat which began last Wednesday after his wife caught a cold. Denies headaches, body aches, sensation of fever/chills, chest pain, palpitations, dizziness, N/V/abdominal pain. Demonstrates frequent reactive/dry cough in office  today.   Has been taking mucinex, tylenol  -   Denies hx of seasonal allergies. Reports wife was placed on antibiotics and recovered quickly. He has not had the influenza vaccine this season.   Former smoker (remote), no significant respiratory hx otherwise.   Past Medical History:  Diagnosis Date  . Bilateral carpal tunnel syndrome   . Dyslipidemia   . Hyperlipidemia   . Ischemic heart disease   . Plantar fasciitis   . Seizure disorder ([place])   . Vitamin D deficiency      Allergies  Allergen Reactions  . Lipitor [Atorvastatin]     myalgia  . Niacin And Related Other (See Comments)    Pt unaware of this intolerance (10/31/13)    Current Outpatient Medications on File Prior to Visit  Medication Sig  . aspirin 81 MG tablet Take 81 mg by mouth daily.    . carbamazepine (CARBATROL) 300 MG 12 hr capsule TAKE 1 CAPSULE BY MOUTH TWO TIMES DAILY  . Cholecalciferol (VITAMIN D PO) Take 10,000 Units by mouth daily.    . fish oil-omega-3 fatty acids 1000 MG capsule Take 1 g by mouth 2 (two) times daily.   . LUTEIN PO Take by mouth daily.  . Magnesium 250 MG TABS Take 250 mg by mouth daily.  . metoprolol succinate (TOPROL-XL) 100 MG 24 hr tablet TAKE 1 TABLET BY MOUTH ONCE DAILY FOR BLOOD PRESSURE  . rosuvastatin (CRESTOR) 40 MG tablet TAKE 1 TABLET BY MOUTH  DAILY   No current facility-administered medications on file prior to visit.  ROS: all negative except above.   Physical Exam:  BP (!) 156/74   Pulse (!) 55   Temp (!) 97.3 F (36.3 C)   Ht 5' 8.5" (1.74 m)   Wt 209 lb (94.8 kg)   SpO2 97%   BMI 31.32 kg/m   General Appearance: Well nourished, in no acute distress, coughs frequently.  Eyes: PERRLA, EOMs, conjunctiva no swelling or erythema Sinuses: No Frontal/maxillary tenderness ENT/Mouth: Ext aud canals clear, TMs without erythema, bulging. No erythema, swelling, or exudate on post pharynx.  Tonsils not swollen or erythematous. Hearing normal.  Neck: Supple.   Respiratory: Respiratory effort normal, BS coarse/scattered rhonchi throughout without rales, wheezing or stridor.  Cardio: RRR with no MRGs. Brisk peripheral pulses without edema.  Abdomen: Soft, + BS.  Non tender, no guarding, rebound, hernias, masses. Lymphatics: Non tender without lymphadenopathy.  Musculoskeletal: symmetrical strength, normal gait.  Skin: Warm, dry without rashes, lesions, ecchymosis.  Neuro: Cranial nerves intact. Normal muscle tone, no cerebellar symptoms.  Psych: Awake and oriented X 3, normal affect, Insight and Judgment appropriate.     Izora Ribas, NP 10:40 AM [facility]

## 2017-08-10 ENCOUNTER — Other Ambulatory Visit: Payer: Self-pay | Admitting: Internal Medicine

## 2017-08-11 NOTE — Progress Notes (Signed)
MEDICARE ANNUAL WELLNESS VISIT AND FOLLOW UP Assessment:   Diagnoses and all orders for this visit:  Encounter for Medicare annual wellness exam  Essential hypertension Continue medications - reminded to take medications consistently Elevated today, but forgot to take medication this AM Monitor blood pressure at home; call if consistently over 130/80 Continue DASH diet.   Reminder to go to the ER if any CP, SOB, nausea, dizziness, severe HA, changes vision/speech, left arm numbness and tingling and jaw pain.  Coronary artery disease without angina pectoris, unspecified vessel or lesion type, unspecified whether native or transplanted heart Control blood pressure, cholesterol, glucose, increase exercise.  Followed by cardiology  Seizure disorder [facility]) Well controlled by carbamazepine with last seizure remote Continue medication, check levels as needed  Vitamin D deficiency At goal at recent check; continue to recommend supplementation for goal of 70-100 Defer vitamin D level  Prediabetes Discussed disease and risks Discussed diet/exercise, weight management  Check A1C, insulin levels routinely  Obesity (BMI 30.0-34.9) Long discussion about weight loss, diet, and exercise Recommended diet heavy in fruits and veggies and low in animal meats, cheeses, and dairy products, appropriate calorie intake Discussed appropriate weight for height Follow up at next visit  Medication management Check CBC, BMP/GFR, LFTs  Mixed hyperlipidemia Continue medications: crestor Continue low cholesterol diet and exercise.  Check lipid panel.   Elevated TSH Without previous hx at last visit, not on levothyroxine Check TSH, T3  Over 30 minutes of exam, counseling, chart review, and critical decision making was performed  Future Appointments  Date Time Provider [place]  12/11/2017  9:30 AM [name], MD [facility] None  06/10/2018 10:00 AM [name], MD [facility] None      Plan:   During the course of the visit the patient was educated and counseled about appropriate screening and preventive services including:    Pneumococcal vaccine   Influenza vaccine  Prevnar 13  Td vaccine  Screening electrocardiogram  Colorectal cancer screening  Diabetes screening  Glaucoma screening  Nutrition counseling    Subjective:  [name] is a 67 y.o. male who presents for Medicare Annual Wellness Visit and 3 month follow up for HTN, hyperlipidemia, prediabetes, and vitamin D Def.  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 In 2008, patient underwent an emergent CABG and has done well since.   BMI is Body mass index is 30.87 kg/m., he has been working on diet and exercise. Wt Readings from Last 3 Encounters:  08/13/17 206 lb (93.4 kg)  07/30/17 209 lb (94.8 kg)  05/11/17 208 lb 12.8 oz (94.7 kg)   His blood pressure has been controlled at home, today their BP is BP: (!) 154/78 He reports he forgot to take BP medications this AM - reminded needs to take consistently. Will have wife Investment banker, corporate) check at home.  He does workout. He denies chest pain, shortness of breath, dizziness.   He is on cholesterol medication (crestor 20 mg daily) and denies myalgias. His LDL cholesterol is at goal; his triglycerides remain elevated. The cholesterol last visit was:   Lab Results  Component Value Date   CHOL 174 05/11/2017   HDL 49 05/11/2017   LDLCALC 99 05/11/2017   TRIG 165 (H) 05/11/2017   CHOLHDL 3.6 05/11/2017   He has been working on diet and exercise for prediabetes, and denies foot ulcerations, increased appetite,  nausea, paresthesia of the feet, polydipsia, polyuria, visual disturbances, vomiting and weight loss. Last A1C in the office was:  Lab Results  Component Value Date   HGBA1C 5.7 (H) 05/11/2017   He is not on  thyroid medication but demonstrated mild TSH elevation at last check:   Lab Results  Component Value Date   TSH 4.83 (H) 05/11/2017   Last GFR Lab Results  Component Value Date   [facility] 66 05/11/2017    Patient is on Vitamin D supplement and at goal at recent check:    Lab Results  Component Value Date   VD25OH 69 05/11/2017      Medication Review: Current Outpatient Medications on File Prior to Visit  Medication Sig Dispense Refill  . aspirin 81 MG tablet Take 81 mg by mouth daily.      . carbamazepine (CARBATROL) 300 MG 12 hr capsule TAKE 1 CAPSULE BY MOUTH TWO TIMES DAILY 180 capsule 0  . Cholecalciferol (VITAMIN D PO) Take 10,000 Units by mouth daily.      . fish oil-omega-3 fatty acids 1000 MG capsule Take 1 g by mouth 2 (two) times daily.     . LUTEIN PO Take by mouth daily.    . Magnesium 250 MG TABS Take 250 mg by mouth daily.    . metoprolol succinate (TOPROL-XL) 100 MG 24 hr tablet TAKE 1 TABLET BY MOUTH ONCE DAILY FOR BLOOD PRESSURE 90 tablet 1  . rosuvastatin (CRESTOR) 40 MG tablet TAKE 1 TABLET BY MOUTH  DAILY 90 tablet 1  . benzonatate (TESSALON PERLES) 100 MG capsule Take 2 capsules (200 mg total) by mouth 3 (three) times daily as needed for cough (Max: 600mg  per day). (Patient not taking: Reported on 08/13/2017) 60 capsule 0  . predniSONE (DELTASONE) 20 MG tablet 2 tablets daily for 3 days, 1 tablet daily for 4 days. (Patient not taking: Reported on 08/13/2017) 10 tablet 0  . promethazine-dextromethorphan (PROMETHAZINE-DM) 6.25-15 MG/5ML syrup Take 5 mLs by mouth 4 (four) times daily as needed for cough. 240 mL 1   No current facility-administered medications on file prior to visit.     Allergies: Allergies  Allergen Reactions  . Lipitor [Atorvastatin]     myalgia  . Niacin And Related Other (See Comments)    Pt unaware of this intolerance (10/31/13)    Current Problems (verified) has Seizure disorder ([place]); Hyperlipidemia; Hypertension; CAD (coronary artery  disease); Vitamin D deficiency; Medication management; Obesity (BMI 30.0-34.9); Prediabetes; and Encounter for Medicare annual wellness exam on their problem list.  Screening Tests Immunization History  Administered Date(s) Administered  . Influenza, High Dose Seasonal PF 02/23/2015, 04/21/2017  . Pneumococcal Conjugate-13 12/17/2015  . Pneumococcal Polysaccharide-23 04/22/2009  . Td 04/22/2009  . Zoster 09/09/2009   Preventative care: Last colonoscopy: cologuard 02/2015 Echo 2008 CXR 2008 AB [place] 2005  Prior vaccinations: TD or Tdap: 2010  Influenza: 2018 Pneumococcal: 2010 Prevnar13: 2017 Shingles/Zostavax: 2011  Names of Other Physician/Practitioners you currently use: 1. [facility] here for primary care 2. Dr. Delman Cheadle, eye doctor, last visit 06/2016 3. Dr. Olena Heckle, dentist, last visit 2018  Patient Care Team: [name], MD as PCP - General (Internal Medicine) Larey Dresser, MD as Consulting Physician (Cardiology)  Surgical: He  has a past surgical history that includes Coronary artery bypass graft (08/2006); Cholecystectomy; and Hand surgery. Family His family history includes Diabetes in his father; Hypertension in his mother; Lupus in his sister; Stroke in his mother. Social history  He reports that he quit smoking about 49 years ago. His smoking use included cigarettes. He has never used smokeless tobacco. He reports that he does not drink alcohol or use drugs.  MEDICARE WELLNESS OBJECTIVES: Physical activity: Current Exercise Habits: Home exercise routine, Type of exercise: treadmill, Time (Minutes): 60, Frequency (Times/Week): 5, Weekly Exercise (Minutes/Week): 300, Intensity: Moderate, Exercise limited by: None identified Cardiac risk factors: Cardiac Risk Factors include: advanced age (>21men, >51 women);dyslipidemia;male gender;hypertension;smoking/ tobacco exposure Depression/mood screen:   Depression screen [facility] 2/9  08/13/2017  Decreased Interest 0  Down, Depressed, Hopeless 0  PHQ - 2 Score 0    ADLs:  In your present state of health, do you have any difficulty performing the following activities: 08/13/2017 05/10/2017  Hearing? N N  Vision? N N  Difficulty concentrating or making decisions? N N  Walking or climbing stairs? N N  Dressing or bathing? N N  Doing errands, shopping? N N  Some recent data might be hidden     Cognitive Testing  Alert? Yes  Normal Appearance?Yes  Oriented to person? Yes  Place? Yes   Time? Yes  Recall of three objects?  Yes  Can perform simple calculations? Yes  Displays appropriate judgment?Yes  Can read the correct time from a watch face?Yes  EOL planning: Does Patient Have a Medical Advance Directive?: Yes Type of Advance Directive: Healthcare Power of Attorney, Living will Does patient want to make changes to medical advance directive?: No - Patient declined Copy of [place] in Chart?: No - copy requested   Objective:   Today's Vitals   08/13/17 0938  BP: (!) 154/78  Pulse: (!) 50  Temp: (!) 97.4 F (36.3 C)  SpO2: 98%  Weight: 206 lb (93.4 kg)  Height: 5' 8.5" (1.74 m)   Body mass index is 30.87 kg/m.  General appearance: alert, no distress, WD/WN, male HEENT: normocephalic, sclerae anicteric, TMs pearly, nares patent, no discharge or erythema, pharynx normal Oral cavity: MMM, no lesions Neck: supple, no lymphadenopathy, no thyromegaly, no masses Heart: RRR, normal S1, S2, no murmurs Lungs: CTA bilaterally, no wheezes, rhonchi, or rales Abdomen: +bs, soft, non tender, non distended, no masses, no hepatomegaly, no splenomegaly Musculoskeletal: nontender, no swelling, no obvious deformity Extremities: no edema, no cyanosis, no clubbing Pulses: 2+ symmetric, upper and lower extremities, normal cap refill Neurological: alert, oriented x 3, CN2-12 intact, strength normal upper extremities and lower extremities, sensation normal  throughout, DTRs 2+ throughout, no cerebellar signs, gait normal Psychiatric: normal affect, behavior normal, pleasant   Medicare Attestation I have personally reviewed: The patient's medical and social history Their use of alcohol, tobacco or illicit drugs Their current medications and supplements The patient's functional ability including ADLs,fall risks, home safety risks, cognitive, and hearing and visual impairment Diet and physical activities Evidence for depression or mood disorders  The patient's weight, height, BMI, and visual acuity have been recorded in the chart.  I have made referrals, counseling, and provided education to the patient based on review of the above and I have provided the patient with a written personalized care plan for preventive services.     Izora Ribas, NP   08/13/2017

## 2017-08-13 ENCOUNTER — Ambulatory Visit (INDEPENDENT_AMBULATORY_CARE_PROVIDER_SITE_OTHER): Payer: Medicare Other | Admitting: Adult Health

## 2017-08-13 ENCOUNTER — Encounter: Payer: Self-pay | Admitting: Adult Health

## 2017-08-13 VITALS — BP 154/78 | HR 50 | Temp 97.4°F | Ht 68.5 in | Wt 206.0 lb

## 2017-08-13 DIAGNOSIS — Z23 Encounter for immunization: Secondary | ICD-10-CM

## 2017-08-13 DIAGNOSIS — Z0001 Encounter for general adult medical examination with abnormal findings: Secondary | ICD-10-CM

## 2017-08-13 DIAGNOSIS — Z79899 Other long term (current) drug therapy: Secondary | ICD-10-CM | POA: Diagnosis not present

## 2017-08-13 DIAGNOSIS — I10 Essential (primary) hypertension: Secondary | ICD-10-CM

## 2017-08-13 DIAGNOSIS — I2510 Atherosclerotic heart disease of native coronary artery without angina pectoris: Secondary | ICD-10-CM | POA: Diagnosis not present

## 2017-08-13 DIAGNOSIS — E782 Mixed hyperlipidemia: Secondary | ICD-10-CM

## 2017-08-13 DIAGNOSIS — R6889 Other general symptoms and signs: Secondary | ICD-10-CM | POA: Diagnosis not present

## 2017-08-13 DIAGNOSIS — G40909 Epilepsy, unspecified, not intractable, without status epilepticus: Secondary | ICD-10-CM

## 2017-08-13 DIAGNOSIS — E669 Obesity, unspecified: Secondary | ICD-10-CM

## 2017-08-13 DIAGNOSIS — E559 Vitamin D deficiency, unspecified: Secondary | ICD-10-CM | POA: Diagnosis not present

## 2017-08-13 DIAGNOSIS — R7303 Prediabetes: Secondary | ICD-10-CM

## 2017-08-13 DIAGNOSIS — R7989 Other specified abnormal findings of blood chemistry: Secondary | ICD-10-CM | POA: Diagnosis not present

## 2017-08-13 DIAGNOSIS — Z0000 Encounter for general adult medical examination without abnormal findings: Secondary | ICD-10-CM

## 2017-08-13 NOTE — Patient Instructions (Signed)
Monitor your blood pressure at home, please keep a record and bring that in with you to your next office visit.   Go to the ER if any CP, SOB, nausea, dizziness, severe HA, changes vision/speech  Due to a recent study, SPRINT, we have changed our goal for the systolic or top blood pressure number. Ideally we want your top number at 120.  In the [facility] Trial, 5000 people were randomized to a goal BP of 120 and 5000 people were randomized to a goal BP of less than 140. The patients with the goal BP at 120 had LESS DEMENTIA, LESS HEART ATTACKS, AND LESS STROKES, AS WELL AS OVERALL DECREASED MORTALITY OR DEATH RATE.   If you are willing, our goal BP is the top number of 120.  Your most recent BP: BP: (!) 154/78   Take your medications faithfully as instructed. Maintain a healthy weight. Get at least 150 minutes of aerobic exercise per week. Minimize salt intake. Minimize alcohol intake  DASH Eating Plan DASH stands for "Dietary Approaches to Stop Hypertension." The DASH eating plan is a healthy eating plan that has been shown to reduce high blood pressure (hypertension). Additional health benefits may include reducing the risk of type 2 diabetes mellitus, heart disease, and stroke. The DASH eating plan may also help with weight loss. WHAT DO I NEED TO KNOW ABOUT THE DASH EATING PLAN? For the DASH eating plan, you will follow these general guidelines:  Choose foods with a percent daily value for sodium of less than 5% (as listed on the food label).  Use salt-free seasonings or herbs instead of table salt or sea salt.  Check with your health care provider or pharmacist before using salt substitutes.  Eat lower-sodium products, often labeled as "lower sodium" or "no salt added."  Eat fresh foods.  Eat more vegetables, fruits, and low-fat dairy products.  Choose whole grains. Look for the word "whole" as the first word in the ingredient list.  Choose fish and skinless chicken or [place]  more often than red meat. Limit fish, poultry, and meat to 6 oz (170 g) each day.  Limit sweets, desserts, sugars, and sugary drinks.  Choose heart-healthy fats.  Limit cheese to 1 oz (28 g) per day.  Eat more home-cooked food and less restaurant, buffet, and fast food.  Limit fried foods.  Cook foods using methods other than frying.  Limit canned vegetables. If you do use them, rinse them well to decrease the sodium.  When eating at a restaurant, ask that your food be prepared with less salt, or no salt if possible. WHAT FOODS CAN I EAT? Seek help from a dietitian for individual calorie needs. Grains Whole grain or whole wheat bread. Brown rice. Whole grain or whole wheat pasta. Quinoa, bulgur, and whole grain cereals. Low-sodium cereals. Corn or whole wheat flour tortillas. Whole grain cornbread. Whole grain crackers. Low-sodium crackers. Vegetables Fresh or frozen vegetables (raw, steamed, roasted, or grilled). Low-sodium or reduced-sodium tomato and vegetable juices. Low-sodium or reduced-sodium tomato sauce and paste. Low-sodium or reduced-sodium canned vegetables.  Fruits All fresh, canned (in natural juice), or frozen fruits. Meat and Other Protein Products Ground beef (85% or leaner), grass-fed beef, or beef trimmed of fat. Skinless chicken or [place]. Ground chicken or [place]. Pork trimmed of fat. All fish and seafood. Eggs. Dried beans, peas, or lentils. Unsalted nuts and seeds. Unsalted canned beans. Dairy Low-fat dairy products, such as skim or 1% milk, 2% or reduced-fat cheeses, low-fat ricotta  or cottage cheese, or plain low-fat yogurt. Low-sodium or reduced-sodium cheeses. Fats and Oils Tub margarines without trans fats. Light or reduced-fat mayonnaise and salad dressings (reduced sodium). Avocado. Safflower, olive, or canola oils. Natural peanut or almond butter. Other Unsalted popcorn and pretzels. The items listed above may not be a complete list of recommended foods  or beverages. Contact your dietitian for more options. WHAT FOODS ARE NOT RECOMMENDED? Grains White bread. White pasta. White rice. Refined cornbread. Bagels and croissants. Crackers that contain trans fat. Vegetables Creamed or fried vegetables. Vegetables in a cheese sauce. Regular canned vegetables. Regular canned tomato sauce and paste. Regular tomato and vegetable juices. Fruits Dried fruits. Canned fruit in light or heavy syrup. Fruit juice. Meat and Other Protein Products Fatty cuts of meat. Ribs, chicken wings, bacon, sausage, bologna, salami, chitterlings, fatback, hot dogs, bratwurst, and packaged luncheon meats. Salted nuts and seeds. Canned beans with salt. Dairy Whole or 2% milk, cream, half-and-half, and cream cheese. Whole-fat or sweetened yogurt. Full-fat cheeses or blue cheese. Nondairy creamers and whipped toppings. Processed cheese, cheese spreads, or cheese curds. Condiments Onion and garlic salt, seasoned salt, table salt, and sea salt. Canned and packaged gravies. Worcestershire sauce. Tartar sauce. Barbecue sauce. Teriyaki sauce. Soy sauce, including reduced sodium. Steak sauce. Fish sauce. Oyster sauce. Cocktail sauce. Horseradish. Ketchup and mustard. Meat flavorings and tenderizers. Bouillon cubes. Hot sauce. Tabasco sauce. Marinades. Taco seasonings. Relishes. Fats and Oils Butter, stick margarine, lard, shortening, ghee, and bacon fat. Coconut, palm kernel, or palm oils. Regular salad dressings. Other Pickles and olives. Salted popcorn and pretzels. The items listed above may not be a complete list of foods and beverages to avoid. Contact your dietitian for more information. WHERE CAN I FIND MORE INFORMATION? National Heart, Lung, and Blood Institute: travelstabloid.com Document Released: 04/27/2011 Document Revised: 09/22/2013 Document Reviewed: 03/12/2013 [facility] Patient Information 2015 [place], [place]. This information is not  intended to replace advice given to you by your health care provider. Make sure you discuss any questions you have with your health care provider.      When it comes to diets, agreement about the perfect plan isn't easy to find, even among the experts. Experts at the [place] developed an idea known as the Healthy Eating Plate. Just imagine a plate divided into logical, healthy portions.  The emphasis is on diet quality:  Load up on vegetables and fruits - one-half of your plate: Aim for color and variety, and remember that potatoes don't count.  Go for whole grains - one-quarter of your plate: Whole wheat, barley, wheat berries, quinoa, oats, brown rice, and foods made with them. If you want pasta, go with whole wheat pasta.  Protein power - one-quarter of your plate: Fish, chicken, beans, and nuts are all healthy, versatile protein sources. Limit red meat.  The diet, however, does go beyond the plate, offering a few other suggestions.  Use healthy plant oils, such as olive, canola, soy, corn, sunflower and peanut. Check the labels, and avoid partially hydrogenated oil, which have unhealthy trans fats.  If you're thirsty, drink water. Coffee and tea are good in moderation, but skip sugary drinks and limit milk and dairy products to one or two daily servings.  The type of carbohydrate in the diet is more important than the amount. Some sources of carbohydrates, such as vegetables, fruits, whole grains, and beans-are healthier than others.  Finally, stay active.

## 2017-08-14 LAB — BASIC METABOLIC PANEL WITH GFR
BUN: 12 mg/dL (ref 7–25)
CHLORIDE: 104 mmol/L (ref 98–110)
CO2: 32 mmol/L (ref 20–32)
CREATININE: 0.97 mg/dL (ref 0.70–1.25)
Calcium: 9.4 mg/dL (ref 8.6–10.3)
GFR, Est African American: 92 mL/min/{1.73_m2} (ref >=60)
GFR, Est Non African American: 79 mL/min/{1.73_m2} (ref >=60)
GLUCOSE: 104 mg/dL — AB (ref 65–99)
POTASSIUM: 5 mmol/L (ref 3.5–5.3)
Sodium: 140 mmol/L (ref 135–146)

## 2017-08-14 LAB — CBC WITH DIFFERENTIAL/PLATELET
BASOS ABS: 31 {cells}/uL (ref 0–200)
Basophils Relative: 0.8 %
EOS PCT: 1 %
Eosinophils Absolute: 39 cells/uL (ref 15–500)
HCT: 42.5 % (ref 38.5–50.0)
HEMOGLOBIN: 14.5 g/dL (ref 13.2–17.1)
Lymphs Abs: 1213 cells/uL (ref 850–3900)
MCH: 31.3 pg (ref 27.0–33.0)
MCHC: 34.1 g/dL (ref 32.0–36.0)
MCV: 91.6 fL (ref 80.0–100.0)
MONOS PCT: 9 %
MPV: 10 fL (ref 7.5–12.5)
NEUTROS ABS: 2266 {cells}/uL (ref 1500–7800)
Neutrophils Relative %: 58.1 %
Platelets: 160 10*3/uL (ref 140–400)
RBC: 4.64 10*6/uL (ref 4.20–5.80)
RDW: 12.3 % (ref 11.0–15.0)
Total Lymphocyte: 31.1 %
WBC mixed population: 351 cells/uL (ref 200–950)
WBC: 3.9 10*3/uL (ref 3.8–10.8)

## 2017-08-14 LAB — HEPATIC FUNCTION PANEL
AG Ratio: 1.9 (calc) (ref 1.0–2.5)
ALKALINE PHOSPHATASE (APISO): 57 U/L (ref 40–115)
ALT: 18 U/L (ref 9–46)
AST: 21 U/L (ref 10–35)
Albumin: 4.3 g/dL (ref 3.6–5.1)
BILIRUBIN INDIRECT: 0.2 mg/dL (ref 0.2–1.2)
Bilirubin, Direct: 0.1 mg/dL (ref 0.0–0.2)
Globulin: 2.3 g/dL (calc) (ref 1.9–3.7)
TOTAL PROTEIN: 6.6 g/dL (ref 6.1–8.1)
Total Bilirubin: 0.3 mg/dL (ref 0.2–1.2)

## 2017-08-14 LAB — HEMOGLOBIN A1C
EAG (MMOL/L): 6.3 (calc)
Hgb A1c MFr Bld: 5.6 % of total Hgb (ref <5.7)
MEAN PLASMA GLUCOSE: 114 (calc)

## 2017-08-14 LAB — LIPID PANEL
Cholesterol: 160 mg/dL (ref <200)
HDL: 44 mg/dL (ref >40)
LDL Cholesterol (Calc): 84 mg/dL (calc)
Non-HDL Cholesterol (Calc): 116 mg/dL (calc) (ref <130)
TRIGLYCERIDES: 219 mg/dL — AB (ref <150)
Total CHOL/HDL Ratio: 3.6 (calc) (ref <5.0)

## 2017-08-14 LAB — T3: T3 TOTAL: 109 ng/dL (ref 76–181)

## 2017-08-14 LAB — TSH: TSH: 2.92 mIU/L (ref 0.40–4.50)

## 2017-08-14 LAB — CARBAMAZEPINE LEVEL, TOTAL: Carbamazepine Lvl: 5 mg/L (ref 4.0–12.0)

## 2017-09-28 ENCOUNTER — Other Ambulatory Visit: Payer: Self-pay | Admitting: Internal Medicine

## 2017-09-28 DIAGNOSIS — I10 Essential (primary) hypertension: Secondary | ICD-10-CM

## 2017-10-09 ENCOUNTER — Ambulatory Visit (INDEPENDENT_AMBULATORY_CARE_PROVIDER_SITE_OTHER): Payer: Medicare Other | Admitting: Internal Medicine

## 2017-10-09 ENCOUNTER — Encounter: Payer: Self-pay | Admitting: Internal Medicine

## 2017-10-09 VITALS — BP 136/70 | HR 60 | Temp 97.7°F | Ht 68.5 in | Wt 203.0 lb

## 2017-10-09 DIAGNOSIS — W57XXXA Bitten or stung by nonvenomous insect and other nonvenomous arthropods, initial encounter: Secondary | ICD-10-CM

## 2017-10-09 DIAGNOSIS — I10 Essential (primary) hypertension: Secondary | ICD-10-CM | POA: Diagnosis not present

## 2017-10-09 MED ORDER — DOXYCYCLINE HYCLATE 100 MG PO CAPS: ORAL_CAPSULE | ORAL | 0 refills | Status: DC

## 2017-10-09 NOTE — Patient Instructions (Signed)
tick Tick Bite Information, Adult Ticks are insects that can bite. Most ticks live in shrubs and grassy areas. They climb onto people and animals that go by. Then they bite. Some ticks carry germs that can make you sick. How can I prevent tick bites?  Use an insect repellent that has 20% or higher of the ingredients DEET, picaridin, or IR3535. Put this insect repellent on: ? Bare skin. ? The tops of your boots. ? Your pant legs. ? The ends of your sleeves.  If you use an insect repellent that has the ingredient permethrin, make sure to follow the instructions on the bottle. Treat the following: ? Clothing. ? Supplies. ? Boots. ? Tents.  Wear long sleeves, long pants, and light colors.  Tuck your pant legs into your socks.  Stay in the middle of the trail.  Try not to walk through long grass.  Before going inside your house, check your clothes, hair, and skin for ticks. Make sure to check your head, neck, armpits, waist, groin, and joint areas.  Check for ticks every day.  When you come indoors: ? Wash your clothes right away. ? Shower right away. ? Dry your clothes in a dryer on high heat for 60 minutes or more. What is the right way to remove a tick? Remove a tick from your skin as soon as possible.  To remove a tick that is crawling on your skin: ? Go outdoors and brush the tick off. ? Use tape or a lint roller.  To remove a tick that is biting: ? Wash your hands. ? If you have latex gloves, put them on. ? Use tweezers, curved forceps, or a tick-removal tool to grasp the tick. Grasp the tick as close to your skin and as close to the tick's head as possible. ? Gently pull up until the tick lets go.  Try to keep the tick's head attached to its body.  Do not twist or jerk the tick.  Do not squeeze or crush the tick.  Do not try to remove a tick with heat, alcohol, petroleum jelly, or fingernail polish. How should I get rid of a tick? Here are some ways to get rid  of a tick that is alive:  Place the tick in rubbing alcohol.  Place the tick in a bag or container you can close tightly.  Wrap the tick tightly in tape.  Flush the tick down the toilet.  Contact a doctor if:  You have symptoms of a disease, such as: ? Pain in a muscle, joint, or bone. ? Trouble walking or moving your legs. ? Numbness in your legs. ? Inability to move (paralysis). ? A red rash that makes a circle (bull's-eye rash). ? Redness and swelling where the tick bit you. ? A fever. ? Throwing up (vomiting) over and over. ? Diarrhea. ? Weight loss. ? Tender and swollen lymph glands. ? Shortness of breath. ? Cough. ? Belly pain (abdominal pain). ? Headache. ? Being more tired than normal. ? A change in how alert (conscious) you are. ? Confusion. Get help right away if:  You cannot remove a tick.  A part of a tick breaks off and gets stuck in your skin.  You are feeling worse. Summary  Ticks may carry germs that can make you sick.  To prevent tick bites, wear long sleeves, long pants, and light colors. Use insect repellent. Follow the instructions on the bottle.  If the tick is biting, do not try  to remove it with heat, alcohol, petroleum jelly, or fingernail polish.  Use tweezers, curved forceps, or a tick-removal tool to grasp the tick. Gently pull up until the tick lets go. Do not twist or jerk the tick. Do not squeeze or crush the tick.  If you have symptoms, contact a doctor. This information is not intended to replace advice given to you by your health care provider. Make sure you discuss any questions you have with your health care provider. Document Released: 08/02/2009 Document Revised: 08/18/2016 Document Reviewed: 08/18/2016 Elsevier Interactive Patient Education  2018 [employer].

## 2017-10-09 NOTE — Progress Notes (Signed)
  Subjective:    Patient ID: [name], male    DOB: March 02, 1951, 67 y.o.   MRN: [id number]  HPI  This very nice 67 yo MWM present with concern of several tick bites noted 4 days previous and subsequent development of a red wheal type rash around one on his back. Wife had removed the ticks and felt that they were very small, but appeared bloated as having fed. Denies fevers, chills , sweats, or rashes otherwise.   Medication Sig  . aspirin 81 MG t Take  daily.    . carbamazepine  300 MG 12 hr cap TAKE 1 CAP TWO TIMES DAILY  . VITAMIN D 10,000 Units  Take daily.    . fish oil-omega-3 1000 MG cap Take 1 g  2  times daily.   . LUTEIN Take  daily.  . Magnesium 250 MG  Take  daily.  . TOPROL-XL 100 MG TAKE 1 TAB  DAILY   . rosuvastatin  40 MG tablet TAKE 1 TAB  DAILY   10 point systems review negative except as above.    Objective:   Physical Exam   BP 136/70   P 60   T 97.7 F    Ht 5' 8.5"  Wt 203 lb  SpO2 96%   BMI 30.42 kg/m   Skin focused exam found several tiny firm areas with tiny central punctate eschars and one area on his Rt posterior flank with a 1&1/2 " red ? sl raised flair.   Assessment & Plan:   1. Tick bite, initial encounter  - doxycycline (VIBRAMYCIN) 100 MG capsule; Take 2 capsules now , then 1 capsule 2 x/ day with food  Dispense: 14 capsule; Refill: 0  - discussed meds, SE's  & tick bite related sx's to be aware of

## 2017-12-11 ENCOUNTER — Ambulatory Visit (INDEPENDENT_AMBULATORY_CARE_PROVIDER_SITE_OTHER): Payer: Medicare Other | Admitting: Internal Medicine

## 2017-12-11 ENCOUNTER — Encounter: Payer: Self-pay | Admitting: Internal Medicine

## 2017-12-11 VITALS — BP 132/70 | HR 67 | Temp 97.7°F | Resp 16 | Ht 68.5 in | Wt 204.2 lb

## 2017-12-11 DIAGNOSIS — Z79899 Other long term (current) drug therapy: Secondary | ICD-10-CM

## 2017-12-11 DIAGNOSIS — E782 Mixed hyperlipidemia: Secondary | ICD-10-CM | POA: Diagnosis not present

## 2017-12-11 DIAGNOSIS — I10 Essential (primary) hypertension: Secondary | ICD-10-CM | POA: Diagnosis not present

## 2017-12-11 DIAGNOSIS — E559 Vitamin D deficiency, unspecified: Secondary | ICD-10-CM | POA: Diagnosis not present

## 2017-12-11 DIAGNOSIS — I2510 Atherosclerotic heart disease of native coronary artery without angina pectoris: Secondary | ICD-10-CM

## 2017-12-11 DIAGNOSIS — R7303 Prediabetes: Secondary | ICD-10-CM

## 2017-12-11 DIAGNOSIS — R7309 Other abnormal glucose: Secondary | ICD-10-CM

## 2017-12-11 DIAGNOSIS — E039 Hypothyroidism, unspecified: Secondary | ICD-10-CM

## 2017-12-11 DIAGNOSIS — G40909 Epilepsy, unspecified, not intractable, without status epilepticus: Secondary | ICD-10-CM

## 2017-12-11 NOTE — Patient Instructions (Signed)

## 2017-12-11 NOTE — Progress Notes (Signed)
This very nice 67 y.o. MWM presents for 6 month follow up with HTN, ASCAD, Hypothyroidism, Seizure Disorder,  HLD, Pre-Diabetes and Vitamin D Deficiency.      Patient is treated for HTN & BP has been controlled at home. Today's BP is at goal - 132/70. Patient has had no complaints of any cardiac type chest pain, palpitations, dyspnea / orthopnea / PND, dizziness, claudication, or dependent edema.     Hyperlipidemia is controlled with diet & meds. Patient denies myalgias or other med SE's. Last Lipids were at goal albeit elevated Trig's:  Lab Results  Component Value Date   CHOL 160 08/13/2017   HDL 44 08/13/2017   LDLCALC 84 08/13/2017   TRIG 219 (H) 08/13/2017   CHOLHDL 3.6 08/13/2017      Also, the patient has history of PreDiabetes and has had no symptoms of reactive hypoglycemia, diabetic polys, paresthesias or visual blurring.  Last A1c was Normal & at goal: Lab Results  Component Value Date   HGBA1C 5.6 08/13/2017      Further, the patient also has history of Vitamin D Deficiency and supplements vitamin D without any suspected side-effects. Last vitamin D was at goal: Lab Results  Component Value Date   VD25OH 69 05/11/2017   Current Outpatient Medications on File Prior to Visit  Medication Sig  . aspirin 81 MG tablet Take 81 mg by mouth daily.    . carbamazepine (CARBATROL) 300 MG 12 hr capsule TAKE 1 CAPSULE BY MOUTH TWO TIMES DAILY  . Cholecalciferol (VITAMIN D PO) Take 10,000 Units by mouth daily.    . fish oil-omega-3 fatty acids 1000 MG capsule Take 1 g by mouth 2 (two) times daily.   . LUTEIN PO Take by mouth daily.  . Magnesium 250 MG TABS Take 250 mg by mouth daily.  . metoprolol succinate (TOPROL-XL) 100 MG 24 hr tablet TAKE 1 TABLET BY MOUTH ONCE DAILY FOR BLOOD PRESSURE  . rosuvastatin (CRESTOR) 40 MG tablet TAKE 1 TABLET BY MOUTH  DAILY   No current facility-administered medications on file prior to visit.    Allergies  Allergen Reactions  . Lipitor  [Atorvastatin]     myalgia  . Niacin And Related Other (See Comments)    Pt unaware of this intolerance (10/31/13)   PMHx:   Past Medical History:  Diagnosis Date  . Bilateral carpal tunnel syndrome   . Dyslipidemia   . Hyperlipidemia   . Ischemic heart disease   . Plantar fasciitis   . Seizure disorder ([place])   . Vitamin D deficiency    Immunization History  Administered Date(s) Administered  . Influenza, High Dose Seasonal PF 02/23/2015, 04/21/2017  . Pneumococcal Conjugate-13 12/17/2015  . Pneumococcal Polysaccharide-23 04/22/2009  . Td 04/22/2009  . Zoster 09/09/2009   Past Surgical History:  Procedure Laterality Date  . CHOLECYSTECTOMY    . CORONARY ARTERY BYPASS GRAFT  08/2006  . HAND SURGERY     FHx:    Reviewed / unchanged  SHx:    Reviewed / unchanged   Systems Review:  Constitutional: Denies fever, chills, wt changes, headaches, insomnia, fatigue, night sweats, change in appetite. Eyes: Denies redness, blurred vision, diplopia, discharge, itchy, watery eyes.  ENT: Denies discharge, congestion, post nasal drip, epistaxis, sore throat, earache, hearing loss, dental pain, tinnitus, vertigo, sinus pain, snoring.  CV: Denies chest pain, palpitations, irregular heartbeat, syncope, dyspnea, diaphoresis, orthopnea, PND, claudication or edema. Respiratory: denies cough, dyspnea, DOE, pleurisy, hoarseness, laryngitis, wheezing.  Gastrointestinal: Denies dysphagia, odynophagia, heartburn, reflux, water brash, abdominal pain or cramps, nausea, vomiting, bloating, diarrhea, constipation, hematemesis, melena, hematochezia  or hemorrhoids. Genitourinary: Denies dysuria, frequency, urgency, nocturia, hesitancy, discharge, hematuria or flank pain. Musculoskeletal: Denies arthralgias, myalgias, stiffness, jt. swelling, pain, limping or strain/sprain.  Skin: Denies pruritus, rash, hives, warts, acne, eczema or change in skin lesion(s). Neuro: No weakness, tremor, incoordination,  spasms, paresthesia or pain. Psychiatric: Denies confusion, memory loss or sensory loss. Endo: Denies change in weight, skin or hair change.  Heme/Lymph: No excessive bleeding, bruising or enlarged lymph nodes.  Physical Exam  BP 132/70   Pulse 67   Temp 97.7 F (36.5 C)   Resp 16   Ht 5' 8.5" (1.74 m)   Wt 204 lb 3.2 oz (92.6 kg)   SpO2 99%   BMI 30.60 kg/m   Appears  well nourished, well groomed  and in no distress.  Eyes: PERRLA, EOMs, conjunctiva no swelling or erythema. Sinuses: No frontal/maxillary tenderness ENT/Mouth: EAC's clear, TM's nl w/o erythema, bulging. Nares clear w/o erythema, swelling, exudates. Oropharynx clear without erythema or exudates. Oral hygiene is good. Tongue normal, non obstructing. Hearing intact.  Neck: Supple. Thyroid not palpable. Car 2+/2+ without bruits, nodes or JVD. Chest: Respirations nl with BS clear & equal w/o rales, rhonchi, wheezing or stridor.  Cor: Heart sounds normal w/ regular rate and rhythm without sig. murmurs, gallops, clicks or rubs. Peripheral pulses normal and equal  without edema.  Abdomen: Soft & bowel sounds normal. Non-tender w/o guarding, rebound, hernias, masses or organomegaly.  Lymphatics: Unremarkable.  Musculoskeletal: Full ROM all peripheral extremities, joint stability, 5/5 strength and normal gait.  Skin: Warm, dry without exposed rashes, lesions or ecchymosis apparent.  Neuro: Cranial nerves intact, reflexes equal bilaterally. Sensory-motor testing grossly intact. Tendon reflexes grossly intact.  Pysch: Alert & oriented x 3.  Insight and judgement nl & appropriate. No ideations.  Assessment and Plan:  1. Essential hypertension  - Continue medication, monitor blood pressure at home.  - Continue DASH diet.  Reminder to go to the ER if any CP,  SOB, nausea, dizziness, severe HA, changes vision/speech.  - CBC with Differential/Platelet - COMPLETE METABOLIC PANEL WITH GFR - Magnesium - TSH  2.  Hyperlipidemia, mixed  - Continue diet/meds, exercise,& lifestyle modifications.  - Continue monitor periodic cholesterol/liver & renal functions   - Lipid panel - TSH  3. Abnormal glucose  - Continue diet, exercise, lifestyle modifications.  - Monitor appropriate labs.  - Hemoglobin A1c - Insulin, random  4. Vitamin D deficiency  - Continue supplementation.  - VITAMIN D 25 Hydroxyl  5. Prediabetes  - Hemoglobin A1c - Insulin, random  6. Coronary artery disease without angina pectoris  - Lipid panel  7. Seizure disorder (HCC)  - Carbamazepine level, total  8. Hypothyroidism  - TSH  9. Medication management  - CBC with Differential/Platelet - COMPLETE METABOLIC PANEL WITH GFR - Magnesium - Lipid panel - TSH - Hemoglobin A1c - Insulin, random - VITAMIN D 25 Hydroxyl - Carbamazepine level, total       Discussed  regular exercise, BP monitoring, weight control to achieve/maintain BMI less than 25 and discussed med and SE's. Recommended labs to assess and monitor clinical status with further disposition pending results of labs. Over 30 minutes of exam, counseling, chart review was performed.

## 2017-12-12 LAB — COMPLETE METABOLIC PANEL WITH GFR
AG RATIO: 1.8 (calc) (ref 1.0–2.5)
ALT: 12 U/L (ref 9–46)
AST: 16 U/L (ref 10–35)
Albumin: 4.5 g/dL (ref 3.6–5.1)
Alkaline phosphatase (APISO): 60 U/L (ref 40–115)
BILIRUBIN TOTAL: 0.4 mg/dL (ref 0.2–1.2)
BUN: 19 mg/dL (ref 7–25)
CALCIUM: 9.2 mg/dL (ref 8.6–10.3)
CO2: 28 mmol/L (ref 20–32)
Chloride: 103 mmol/L (ref 98–110)
Creat: 1.09 mg/dL (ref 0.70–1.25)
GFR, EST AFRICAN AMERICAN: 80 mL/min/{1.73_m2} (ref >=60)
GFR, EST NON AFRICAN AMERICAN: 69 mL/min/{1.73_m2} (ref >=60)
Globulin: 2.5 g/dL (calc) (ref 1.9–3.7)
Glucose, Bld: 113 mg/dL — ABNORMAL HIGH (ref 65–99)
POTASSIUM: 4.2 mmol/L (ref 3.5–5.3)
Sodium: 140 mmol/L (ref 135–146)
TOTAL PROTEIN: 7 g/dL (ref 6.1–8.1)

## 2017-12-12 LAB — CBC WITH DIFFERENTIAL/PLATELET
BASOS ABS: 31 {cells}/uL (ref 0–200)
Basophils Relative: 0.8 %
EOS PCT: 1.3 %
Eosinophils Absolute: 51 cells/uL (ref 15–500)
HCT: 41.7 % (ref 38.5–50.0)
Hemoglobin: 14.5 g/dL (ref 13.2–17.1)
Lymphs Abs: 1268 cells/uL (ref 850–3900)
MCH: 31.1 pg (ref 27.0–33.0)
MCHC: 34.8 g/dL (ref 32.0–36.0)
MCV: 89.5 fL (ref 80.0–100.0)
MPV: 10.2 fL (ref 7.5–12.5)
Monocytes Relative: 10.9 %
NEUTROS PCT: 54.5 %
Neutro Abs: 2126 cells/uL (ref 1500–7800)
PLATELETS: 161 10*3/uL (ref 140–400)
RBC: 4.66 10*6/uL (ref 4.20–5.80)
RDW: 12 % (ref 11.0–15.0)
TOTAL LYMPHOCYTE: 32.5 %
WBC mixed population: 425 cells/uL (ref 200–950)
WBC: 3.9 10*3/uL (ref 3.8–10.8)

## 2017-12-12 LAB — LIPID PANEL
Cholesterol: 163 mg/dL (ref <200)
HDL: 47 mg/dL (ref >40)
LDL CHOLESTEROL (CALC): 90 mg/dL
Non-HDL Cholesterol (Calc): 116 mg/dL (calc) (ref <130)
Total CHOL/HDL Ratio: 3.5 (calc) (ref <5.0)
Triglycerides: 156 mg/dL — ABNORMAL HIGH (ref <150)

## 2017-12-12 LAB — INSULIN, RANDOM: INSULIN: 9.1 u[IU]/mL (ref 2.0–19.6)

## 2017-12-12 LAB — MAGNESIUM: Magnesium: 2 mg/dL (ref 1.5–2.5)

## 2017-12-12 LAB — CARBAMAZEPINE LEVEL, TOTAL: Carbamazepine Lvl: 5.4 mg/L (ref 4.0–12.0)

## 2017-12-12 LAB — VITAMIN D 25 HYDROXY (VIT D DEFICIENCY, FRACTURES): VIT D 25 HYDROXY: 77 ng/mL (ref 30–100)

## 2017-12-12 LAB — TSH: TSH: 4.74 mIU/L — ABNORMAL HIGH (ref 0.40–4.50)

## 2017-12-12 LAB — HEMOGLOBIN A1C
EAG (MMOL/L): 6.5 (calc)
HEMOGLOBIN A1C: 5.7 %{Hb} — AB (ref <5.7)
Mean Plasma Glucose: 117 (calc)

## 2017-12-15 ENCOUNTER — Encounter: Payer: Self-pay | Admitting: Internal Medicine

## 2017-12-20 ENCOUNTER — Other Ambulatory Visit: Payer: Self-pay | Admitting: Internal Medicine

## 2017-12-20 DIAGNOSIS — G40909 Epilepsy, unspecified, not intractable, without status epilepticus: Secondary | ICD-10-CM

## 2018-03-18 DIAGNOSIS — M67911 Unspecified disorder of synovium and tendon, right shoulder: Secondary | ICD-10-CM | POA: Diagnosis not present

## 2018-03-25 ENCOUNTER — Other Ambulatory Visit: Payer: Self-pay | Admitting: Internal Medicine

## 2018-03-25 DIAGNOSIS — I10 Essential (primary) hypertension: Secondary | ICD-10-CM

## 2018-03-27 DIAGNOSIS — M25511 Pain in right shoulder: Secondary | ICD-10-CM | POA: Diagnosis not present

## 2018-03-27 NOTE — Progress Notes (Signed)
Assessment and Plan:  Hypertension:  -Continue medication,  -well controlled -monitor blood pressure at home.  -Continue DASH diet.   -Reminder to go to the ER if any CP, SOB, nausea, dizziness, severe HA, changes vision/speech, left arm numbness and tingling, and jaw pain.  Cholesterol: -cont medication -will not check level today as has been well controlled.  Can change to twice yearly cholesterol checks -Continue diet and exercise.  -check LFTs today  Pre-diabetes: -Continue diet and exercise.  -A1c have been well controlled -no need to check today  Vitamin D Def: -continue medications.   Seizure disorder -cont meds -no recent activity   Patient left without getting labs, will get at [facility] in Feb Continue diet and meds as discussed. Further disposition pending results of labs. Future Appointments  Date Time Provider [place]  07/15/2018 10:00 AM [name], MD [facility] None  08/22/2018  9:30 AM Liane Comber, NP [facility] None    HPI 67 y.o. male  presents for 3 month follow up with hypertension, hyperlipidemia, prediabetes and vitamin D.  Has been having right shoulder pain, had MRI yesterday.    His blood pressure has been controlled at home, today their BP is BP: 126/84.   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 He denies chest pain, shortness of breath, dizziness.    BMI is Body mass index is 30.27 kg/m., he is working on diet and exercise. Wt Readings from Last 3 Encounters:  03/28/18 202 lb (91.6 kg)  12/11/17 204 lb 3.2 oz (92.6 kg)  10/09/17 203 lb (92.1 kg)   He has not recently had any seizures.  He reports that it has been 9 years since his last seizure. He takes his medications regularly.   He is on cholesterol medication and denies myalgias. His cholesterol is at goal. The cholesterol last visit was:   Lab Results  Component Value Date   CHOL 163 12/11/2017   HDL 47  12/11/2017   LDLCALC 90 12/11/2017   TRIG 156 (H) 12/11/2017   CHOLHDL 3.5 12/11/2017     He has been working on diet and exercise for prediabetes, and denies foot ulcerations, hyperglycemia, hypoglycemia , increased appetite, nausea, paresthesia of the feet, polydipsia, polyuria, visual disturbances, vomiting and weight loss. Last A1C in the office was:  Lab Results  Component Value Date   HGBA1C 5.7 (H) 12/11/2017    Patient is on Vitamin D supplement.  Lab Results  Component Value Date   VD25OH 77 12/11/2017       Current Medications:  Current Outpatient Medications on File Prior to Visit  Medication Sig Dispense Refill  . aspirin 81 MG tablet Take 81 mg by mouth daily.      . carbamazepine (CARBATROL) 300 MG 12 hr capsule TAKE 1 CAPSULE BY MOUTH TWO TIMES DAILY 180 capsule 0  . Cholecalciferol (VITAMIN D PO) Take 10,000 Units by mouth daily.      . fish oil-omega-3 fatty acids 1000 MG capsule Take 1 g by mouth 2 (two) times daily.     . LUTEIN PO Take by mouth daily.    . Magnesium 250 MG TABS Take 250 mg by mouth daily.    . metoprolol succinate (TOPROL-XL) 100 MG 24 hr tablet TAKE 1 TABLET BY MOUTH ONCE DAILY FOR BLOOD PRESSURE 90 tablet 1  . rosuvastatin (CRESTOR) 40 MG tablet TAKE 1 TABLET BY MOUTH  DAILY 90 tablet 1   No  current facility-administered medications on file prior to visit.     Medical History:  Past Medical History:  Diagnosis Date  . Bilateral carpal tunnel syndrome   . Dyslipidemia   . Hyperlipidemia   . Ischemic heart disease   . Plantar fasciitis   . Seizure disorder ([place])   . Vitamin D deficiency     Allergies:  Allergies  Allergen Reactions  . Lipitor [Atorvastatin]     myalgia  . Niacin And Related Other (See Comments)    Pt unaware of this intolerance (10/31/13)     Review of Systems:  Review of Systems  Constitutional: Negative for chills, fever and malaise/fatigue.  HENT: Negative for congestion, ear pain and sore throat.    Eyes: Negative.   Respiratory: Negative for cough, shortness of breath and wheezing.   Cardiovascular: Negative for chest pain, palpitations and leg swelling.  Gastrointestinal: Negative for abdominal pain, blood in stool, constipation, diarrhea, heartburn and melena.  Genitourinary: Negative.   Musculoskeletal: Positive for joint pain (right shoulder).  Skin: Negative.   Neurological: Negative for dizziness, sensory change, loss of consciousness and headaches.  Psychiatric/Behavioral: Negative for depression. The patient is not nervous/anxious and does not have insomnia.     Family history- Review and unchanged  Social history- Review and unchanged  Physical Exam: BP 126/84   Pulse 66   Temp 98 F (36.7 C)   Ht 5' 8.5" (1.74 m)   Wt 202 lb (91.6 kg)   SpO2 98%   BMI 30.27 kg/m  Wt Readings from Last 3 Encounters:  03/28/18 202 lb (91.6 kg)  12/11/17 204 lb 3.2 oz (92.6 kg)  10/09/17 203 lb (92.1 kg)    General Appearance: Well nourished well developed, in no apparent distress. Eyes: PERRLA, EOMs, conjunctiva no swelling or erythema ENT/Mouth: Ear canals normal without obstruction, swelling, erythma, discharge.  TMs normal bilaterally.  Oropharynx moist, clear, without exudate, or postoropharyngeal swelling. Neck: Supple, thyroid normal,no cervical adenopathy  Respiratory: Respiratory effort normal, Breath sounds clear A&P without rhonchi, wheeze, or rale.  No retractions, no accessory usage. Cardio: RRR with no MRGs. Brisk peripheral pulses without edema.  Abdomen: Soft, + BS,  Non tender, no guarding, rebound, hernias, masses. Musculoskeletal: Full ROM, 5/5 strength except right shoulder with limits abduction to 100 degrees due to pain, Normal gait,  Skin: Warm, dry without rashes, lesions, ecchymosis.  Neuro: Awake and oriented X 3, Cranial nerves intact. Normal muscle tone, no cerebellar symptoms. Psych: Normal affect, Insight and Judgment appropriate.    Vicie Mutters, PA-C 10:57 AM [facility]

## 2018-03-28 ENCOUNTER — Ambulatory Visit (INDEPENDENT_AMBULATORY_CARE_PROVIDER_SITE_OTHER): Payer: Medicare Other | Admitting: Physician Assistant

## 2018-03-28 ENCOUNTER — Encounter: Payer: Self-pay | Admitting: Physician Assistant

## 2018-03-28 VITALS — BP 126/84 | HR 66 | Temp 98.0°F | Ht 68.5 in | Wt 202.0 lb

## 2018-03-28 DIAGNOSIS — Z23 Encounter for immunization: Secondary | ICD-10-CM

## 2018-03-28 DIAGNOSIS — E039 Hypothyroidism, unspecified: Secondary | ICD-10-CM | POA: Diagnosis not present

## 2018-03-28 DIAGNOSIS — E782 Mixed hyperlipidemia: Secondary | ICD-10-CM | POA: Diagnosis not present

## 2018-03-28 DIAGNOSIS — I10 Essential (primary) hypertension: Secondary | ICD-10-CM | POA: Diagnosis not present

## 2018-03-28 DIAGNOSIS — M67911 Unspecified disorder of synovium and tendon, right shoulder: Secondary | ICD-10-CM | POA: Diagnosis not present

## 2018-03-28 DIAGNOSIS — R7309 Other abnormal glucose: Secondary | ICD-10-CM | POA: Diagnosis not present

## 2018-03-28 DIAGNOSIS — Z7189 Other specified counseling: Secondary | ICD-10-CM

## 2018-03-28 DIAGNOSIS — Z79899 Other long term (current) drug therapy: Secondary | ICD-10-CM

## 2018-03-28 DIAGNOSIS — M24811 Other specific joint derangements of right shoulder, not elsewhere classified: Secondary | ICD-10-CM | POA: Diagnosis not present

## 2018-03-28 NOTE — Patient Instructions (Addendum)
VENOUS INSUFFICIENCY Our lower leg venous system is not the most reliable, the heart does NOT pump fluid up, there is a valve system.  The muscles of the leg squeeze and the blood moves up and a valve opens and close, then they squeeze, blood moves up and valves open and closes keeping the blood moving towards the heart.  Lots can go wrong with this valve system.  If someone is sitting or standing without movement, everyone will get swelling.  THINGS TO DO:  Do not stand or sit in one position for long periods of time. Do not sit with your legs crossed. Rest with your legs raised during the day.  Your legs have to be higher than your heart so that gravity will force the valves to open, so please really elevate your legs.   Wear elastic stockings or support hose. Do not wear other tight, encircling garments around the legs, pelvis, or waist.  [employer]  has a wide variety of well priced compression stockings. [place], [place] [id number] [id number] [place] has a good cheap selection, I like the socks, they are not as hard to get on  Walk as much as possible to increase blood flow.  Raise the foot of your bed at night with 2-inch blocks.  SEEK MEDICAL CARE IF:   The skin around your ankle starts to break down.  You have pain, redness, tenderness, or hard swelling developing in your leg over a vein.  You are uncomfortable due to leg pain.  If you ever have shortness of breath with exertion or chest pain go to the ER.  RANGE OF A1C   Your A1C is a measure of your sugar over the past 3 months and is not affected by what you have eaten over the past few days. Diabetes increases your chances of stroke and heart attack over 300 % and is the leading cause of blindness and kidney failure in the [place]. Please make sure you decrease bad carbs like white bread, white rice, potatoes, corn, soft drinks, pasta, cereals, refined sugars, sweet tea, dried fruits, and  fruit juice. Good carbs are okay to eat in moderation like sweet potatoes, brown rice, whole grain pasta/bread, most fruit (except dried fruit) and you can eat as many veggies as you want.   Greater than 6.5 is considered diabetic. Between 6.4 and 5.7 is prediabetic If your A1C is less than 5.7 you are NOT diabetic.  Targets for Glucose Readings: Time of Check Target for patients WITHOUT Diabetes Target for DIABETICS  Before Meals Less than 100  less than 150  Two hours after meals Less than 200  Less than 250       Bad carbs also include fruit juice, alcohol, and sweet tea. These are empty calories that do not signal to your brain that you are full.   Please remember the good carbs are still carbs which convert into sugar. So please measure them out no more than 1/2-1 cup of rice, oatmeal, pasta, and beans  Veggies are however free foods! Pile them on.   Not all fruit is created equal. Please see the list below, the fruit at the bottom is higher in sugars than the fruit at the top. Please avoid all dried fruits.

## 2018-04-04 DIAGNOSIS — M67911 Unspecified disorder of synovium and tendon, right shoulder: Secondary | ICD-10-CM | POA: Diagnosis not present

## 2018-04-04 DIAGNOSIS — M25611 Stiffness of right shoulder, not elsewhere classified: Secondary | ICD-10-CM | POA: Diagnosis not present

## 2018-04-17 DIAGNOSIS — M25611 Stiffness of right shoulder, not elsewhere classified: Secondary | ICD-10-CM | POA: Diagnosis not present

## 2018-04-17 DIAGNOSIS — M67911 Unspecified disorder of synovium and tendon, right shoulder: Secondary | ICD-10-CM | POA: Diagnosis not present

## 2018-06-03 DIAGNOSIS — M24811 Other specific joint derangements of right shoulder, not elsewhere classified: Secondary | ICD-10-CM | POA: Diagnosis not present

## 2018-06-03 DIAGNOSIS — M25611 Stiffness of right shoulder, not elsewhere classified: Secondary | ICD-10-CM | POA: Diagnosis not present

## 2018-06-10 ENCOUNTER — Encounter: Payer: Self-pay | Admitting: Internal Medicine

## 2018-06-18 ENCOUNTER — Other Ambulatory Visit: Payer: Self-pay | Admitting: Internal Medicine

## 2018-06-18 DIAGNOSIS — G40909 Epilepsy, unspecified, not intractable, without status epilepticus: Secondary | ICD-10-CM

## 2018-07-03 DIAGNOSIS — D3131 Benign neoplasm of right choroid: Secondary | ICD-10-CM | POA: Diagnosis not present

## 2018-07-03 DIAGNOSIS — H1013 Acute atopic conjunctivitis, bilateral: Secondary | ICD-10-CM | POA: Diagnosis not present

## 2018-07-15 ENCOUNTER — Encounter: Payer: Self-pay | Admitting: Internal Medicine

## 2018-08-01 ENCOUNTER — Ambulatory Visit (INDEPENDENT_AMBULATORY_CARE_PROVIDER_SITE_OTHER): Payer: Medicare Other | Admitting: Internal Medicine

## 2018-08-01 ENCOUNTER — Other Ambulatory Visit: Payer: Self-pay

## 2018-08-01 ENCOUNTER — Encounter: Payer: Self-pay | Admitting: Internal Medicine

## 2018-08-01 VITALS — BP 144/76 | HR 52 | Temp 97.0°F | Resp 16 | Ht 67.5 in | Wt 204.8 lb

## 2018-08-01 DIAGNOSIS — Z0001 Encounter for general adult medical examination with abnormal findings: Secondary | ICD-10-CM

## 2018-08-01 DIAGNOSIS — I10 Essential (primary) hypertension: Secondary | ICD-10-CM | POA: Diagnosis not present

## 2018-08-01 DIAGNOSIS — Z87891 Personal history of nicotine dependence: Secondary | ICD-10-CM

## 2018-08-01 DIAGNOSIS — N401 Enlarged prostate with lower urinary tract symptoms: Secondary | ICD-10-CM

## 2018-08-01 DIAGNOSIS — E559 Vitamin D deficiency, unspecified: Secondary | ICD-10-CM | POA: Diagnosis not present

## 2018-08-01 DIAGNOSIS — E782 Mixed hyperlipidemia: Secondary | ICD-10-CM | POA: Diagnosis not present

## 2018-08-01 DIAGNOSIS — N138 Other obstructive and reflux uropathy: Secondary | ICD-10-CM

## 2018-08-01 DIAGNOSIS — Z79899 Other long term (current) drug therapy: Secondary | ICD-10-CM

## 2018-08-01 DIAGNOSIS — Z0000 Encounter for general adult medical examination without abnormal findings: Secondary | ICD-10-CM | POA: Diagnosis not present

## 2018-08-01 DIAGNOSIS — G40909 Epilepsy, unspecified, not intractable, without status epilepticus: Secondary | ICD-10-CM

## 2018-08-01 DIAGNOSIS — E039 Hypothyroidism, unspecified: Secondary | ICD-10-CM

## 2018-08-01 DIAGNOSIS — Z8249 Family history of ischemic heart disease and other diseases of the circulatory system: Secondary | ICD-10-CM

## 2018-08-01 DIAGNOSIS — Z1212 Encounter for screening for malignant neoplasm of rectum: Secondary | ICD-10-CM

## 2018-08-01 DIAGNOSIS — R7309 Other abnormal glucose: Secondary | ICD-10-CM | POA: Diagnosis not present

## 2018-08-01 DIAGNOSIS — Z136 Encounter for screening for cardiovascular disorders: Secondary | ICD-10-CM | POA: Diagnosis not present

## 2018-08-01 DIAGNOSIS — I2510 Atherosclerotic heart disease of native coronary artery without angina pectoris: Secondary | ICD-10-CM

## 2018-08-01 DIAGNOSIS — Z1211 Encounter for screening for malignant neoplasm of colon: Secondary | ICD-10-CM

## 2018-08-01 DIAGNOSIS — Z125 Encounter for screening for malignant neoplasm of prostate: Secondary | ICD-10-CM

## 2018-08-01 NOTE — Patient Instructions (Signed)

## 2018-08-01 NOTE — Progress Notes (Signed)
[facility]   [name], M.D.     Uvaldo Bristle. Silverio Lay, P.A.-C Liane Comber, [place]                [address] [place] [address] Telephone [phone] Telefax [phone] Annual  Screening/Preventative Visit  & Comprehensive Evaluation & Examination     This very nice 68 y.o. MWM presents for a Screening /Preventative Visit & comprehensive evaluation and management of multiple medical co-morbidities.  Patient has been followed for HTN, HLD, Seizure Disorder(1997),  Prediabetes, occult Hypothyroidism and Vitamin D Deficiency.     HTN predates circa 2004.  Patient's BP has been controlled at home.  Today's BP is slightly elevated at 144/76.  Patient underwent emergent CABG in 2008. Patient denies any cardiac symptoms as chest pain, palpitations, shortness of breath, dizziness or ankle swelling.     Patient's hyperlipidemia is controlled with diet and medications. Patient denies myalgias or other medication SE's. Last lipids were at goal with sl elevated Trig's: Lab Results  Component Value Date   CHOL 163 12/11/2017   HDL 47 12/11/2017   LDLCALC 90 12/11/2017   TRIG 156 (H) 12/11/2017   CHOLHDL 3.5 12/11/2017      Patient has hx/o prediabetes ("A1c 5.7% / 2014)  and patient denies reactive hypoglycemic symptoms, visual blurring, diabetic polys or paresthesias. Last A1c was almost to goal: Lab Results  Component Value Date   HGBA1C 5.7 (H) 12/11/2017       Finally, patient has history of Vitamin D Deficiency ("24" / 2008)  and last vitamin D was at goal: Lab Results  Component Value Date   VD25OH 77 12/11/2017   Current Outpatient Medications on File Prior to Visit  Medication Sig  . aspirin 81 MG tablet Take 81 mg by mouth daily.    . carbamazepine (CARBATROL) 300 MG 12 hr capsule Take 1 tablet 2 x /day to prevent Seizures  . Cholecalciferol (VITAMIN D PO) Take 10,000  Units by mouth daily.    . fish oil-omega-3 fatty acids 1000 MG capsule Take 1 g by mouth 2 (two) times daily.   . LUTEIN PO Take by mouth daily.  . Magnesium 250 MG TABS Take 250 mg by mouth daily.  . metoprolol succinate (TOPROL-XL) 100 MG 24 hr tablet TAKE 1 TABLET BY MOUTH ONCE DAILY FOR BLOOD PRESSURE  . rosuvastatin (CRESTOR) 40 MG tablet TAKE 1 TABLET BY MOUTH  DAILY   No current facility-administered medications on file prior to visit.    Allergies  Allergen Reactions  . Lipitor [Atorvastatin]     myalgia  . Niacin And Related Other (See Comments)    Pt unaware of this intolerance (10/31/13)   Past Medical History:  Diagnosis Date  . Bilateral carpal tunnel syndrome   . Dyslipidemia   . Hyperlipidemia   . Ischemic heart disease   . Plantar fasciitis   . Seizure disorder ([place])   . Vitamin D deficiency    Health Maintenance  Topic Date Due  . Fecal DNA (Cologuard)  03/17/2018  . TETANUS/TDAP  04/23/2019  . INFLUENZA VACCINE  Completed  . Hepatitis C Screening  Completed  . PNA vac Low Risk Adult  Discontinued   Immunization History  Administered Date(s) Administered  . Influenza, High Dose Seasonal PF 02/23/2015, 04/21/2017, 03/28/2018  . Pneumococcal Conjugate-13 12/17/2015  . Pneumococcal Polysaccharide-23  04/22/2009  . Td 04/22/2009  . Zoster 09/09/2009   Cologard - Negative in 03/18/2015 a& 3 yr f/u overdue in Oct 2019. Patient is amenable to redo.   Past Surgical History:  Procedure Laterality Date  . CHOLECYSTECTOMY    . CORONARY ARTERY BYPASS GRAFT  08/2006  . HAND SURGERY     Family History  Problem Relation Age of Onset  . Hypertension Mother   . Stroke Mother   . Diabetes Father   . Lupus Sister    Social History   Socioeconomic History  . Marital status: Married    Spouse name: Not on file  . Number of children: 2  . Years of education: Not on file  . Highest education level: Not on file  Occupational History  . Occupation: Licensed  Optician Retired  Tobacco Use  . Smoking status: Former Smoker    Types: Cigarettes    Last attempt to quit: 05/22/1968    Years since quitting: 50.2  . Smokeless tobacco: Never Used  Substance and Sexual Activity  . Alcohol use: No    Alcohol/week: 0.0 standard drinks    Comment: very rarely  . Drug use: No  . Sexual activity: Not on file  Social History Narrative   Patient lives at home with his wife Vaughan Basta). Patient has two children.    ROS Constitutional: Denies fever, chills, weight loss/gain, headaches, insomnia,  night sweats or change in appetite. Does c/o fatigue. Eyes: Denies redness, blurred vision, diplopia, discharge, itchy or watery eyes.  ENT: Denies discharge, congestion, post nasal drip, epistaxis, sore throat, earache, hearing loss, dental pain, Tinnitus, Vertigo, Sinus pain or snoring.  Cardio: Denies chest pain, palpitations, irregular heartbeat, syncope, dyspnea, diaphoresis, orthopnea, PND, claudication or edema Respiratory: denies cough, dyspnea, DOE, pleurisy, hoarseness, laryngitis or wheezing.  Gastrointestinal: Denies dysphagia, heartburn, reflux, water brash, pain, cramps, nausea, vomiting, bloating, diarrhea, constipation, hematemesis, melena, hematochezia, jaundice or hemorrhoids Genitourinary: Denies dysuria, frequency, urgency, nocturia, hesitancy, discharge, hematuria or flank pain Musculoskeletal: Denies arthralgia, myalgia, stiffness, Jt. Swelling, pain, limp or strain/sprain. Denies Falls. Skin: Denies puritis, rash, hives, warts, acne, eczema or change in skin lesion Neuro: No weakness, tremor, incoordination, spasms, paresthesia or pain Psychiatric: Denies confusion, memory loss or sensory loss. Denies Depression. Endocrine: Denies change in weight, skin, hair change, nocturia, and paresthesia, diabetic polys, visual blurring or hyper / hypo glycemic episodes.  Heme/Lymph: No excessive bleeding, bruising or enlarged lymph nodes.  Physical Exam  BP  (!) 144/76   Pulse (!) 52   Temp (!) 97 F (36.1 C)   Resp 16   Ht 5' 7.5" (1.715 m)   Wt 204 lb 12.8 oz (92.9 kg)   BMI 31.60 kg/m   General Appearance: Well nourished and well groomed and in no apparent distress.  Eyes: PERRLA, EOMs, conjunctiva no swelling or erythema, normal fundi and vessels. Sinuses: No frontal/maxillary tenderness ENT/Mouth: EACs patent / TMs  nl. Nares clear without erythema, swelling, mucoid exudates. Oral hygiene is good. No erythema, swelling, or exudate. Tongue normal, non-obstructing. Tonsils not swollen or erythematous. Hearing normal.  Neck: Supple, thyroid not palpable. No bruits, nodes or JVD. Respiratory: Respiratory effort normal.  BS equal and clear bilateral without rales, rhonci, wheezing or stridor. Cardio: Heart sounds are normal with regular rate and rhythm and no murmurs, rubs or gallops. Peripheral pulses are normal and equal bilaterally without edema. No aortic or femoral bruits. Chest: symmetric with normal excursions and percussion.  Abdomen: Soft, with Nl bowel sounds. Nontender,  no guarding, rebound, hernias, masses, or organomegaly.  Lymphatics: Non tender without lymphadenopathy.  Musculoskeletal: Full ROM all peripheral extremities, joint stability, 5/5 strength, and normal gait. Skin: Warm and dry without rashes, lesions, cyanosis, clubbing or  ecchymosis.  Neuro: Cranial nerves intact, reflexes equal bilaterally. Normal muscle tone, no cerebellar symptoms. Sensation intact.  Pysch: Alert and oriented X 3 with normal affect, insight and judgment appropriate.   Assessment and Plan  1. Annual Preventative/Screening Exam   2. Essential hypertension  - EKG 12-Lead - [place], RETROPERITNL ABD,  LTD - Urinalysis, Routine w reflex microscopic - Microalbumin / creatinine urine ratio - CBC with Differential/Platelet - COMPLETE METABOLIC PANEL WITH GFR - Magnesium - TSH  3. Hyperlipidemia, mixed  - EKG 12-Lead - [place], RETROPERITNL ABD,   LTD - Lipid panel - TSH  4. Abnormal glucose  - EKG 12-Lead - [place], RETROPERITNL ABD,  LTD - Hemoglobin A1c - Insulin, random  5. Vitamin D deficiency  - VITAMIN D 25 Hydroxyl  6. Hypothyroidism  - TSH  7. Coronary artery disease   - EKG 12-Lead  8. Seizure disorder (HCC)  - Carbamazepine level, total  9. FH: hypertension  - EKG 12-Lead - [place], RETROPERITNL ABD,  LTD  10. BPH with obstruction/lower urinary tract symptoms  - PSA  11. Former smoker  - EKG 12-Lead - [place], RETROPERITNL ABD,  LTD  12. Screening for ischemic heart disease  - EKG 12-Lead  13. Screening for colorectal cancer  - Cologuard  14. Prostate cancer screening  - PSA  15. Screening for AAA (aortic abdominal aneurysm)  - [place], RETROPERITNL ABD,  LTD  16. Medication management  - Urinalysis, Routine w reflex microscopic - Microalbumin / creatinine urine ratio - CBC with Differential/Platelet - COMPLETE METABOLIC PANEL WITH GFR - Magnesium - Lipid panel - TSH - Hemoglobin A1c - Insulin, random - VITAMIN D 25 Hydroxyl - Carbamazepine level, total      Patient was counseled in prudent diet, weight control to achieve/maintain BMI less than 25, BP monitoring, regular exercise and medications as discussed.  Discussed med effects and SE's. Routine screening labs and tests as requested with regular follow-up as recommended. Over 40 minutes of exam, counseling, chart review and high complex critical decision making was performed

## 2018-08-02 LAB — CBC WITH DIFFERENTIAL/PLATELET
Absolute Monocytes: 384 cells/uL (ref 200–950)
Basophils Absolute: 42 cells/uL (ref 0–200)
Basophils Relative: 1.1 %
Eosinophils Absolute: 72 cells/uL (ref 15–500)
Eosinophils Relative: 1.9 %
HCT: 43.7 % (ref 38.5–50.0)
HEMOGLOBIN: 15.1 g/dL (ref 13.2–17.1)
Lymphs Abs: 1129 cells/uL (ref 850–3900)
MCH: 31.1 pg (ref 27.0–33.0)
MCHC: 34.6 g/dL (ref 32.0–36.0)
MCV: 89.9 fL (ref 80.0–100.0)
MPV: 10.1 fL (ref 7.5–12.5)
Monocytes Relative: 10.1 %
Neutro Abs: 2174 cells/uL (ref 1500–7800)
Neutrophils Relative %: 57.2 %
Platelets: 167 10*3/uL (ref 140–400)
RBC: 4.86 10*6/uL (ref 4.20–5.80)
RDW: 12.4 % (ref 11.0–15.0)
Total Lymphocyte: 29.7 %
WBC: 3.8 10*3/uL (ref 3.8–10.8)

## 2018-08-02 LAB — HEMOGLOBIN A1C
Hgb A1c MFr Bld: 5.7 % of total Hgb — ABNORMAL HIGH (ref <5.7)
Mean Plasma Glucose: 117 (calc)
eAG (mmol/L): 6.5 (calc)

## 2018-08-02 LAB — COMPLETE METABOLIC PANEL WITH GFR
AG Ratio: 1.9 (calc) (ref 1.0–2.5)
ALT: 13 U/L (ref 9–46)
AST: 17 U/L (ref 10–35)
Albumin: 4.5 g/dL (ref 3.6–5.1)
Alkaline phosphatase (APISO): 65 U/L (ref 35–144)
BUN: 19 mg/dL (ref 7–25)
CALCIUM: 9.6 mg/dL (ref 8.6–10.3)
CO2: 28 mmol/L (ref 20–32)
Chloride: 102 mmol/L (ref 98–110)
Creat: 1.08 mg/dL (ref 0.70–1.18)
GFR, EST NON AFRICAN AMERICAN: 69 mL/min/{1.73_m2} (ref >=60)
GFR, Est African American: 80 mL/min/{1.73_m2} (ref >=60)
GLOBULIN: 2.4 g/dL (ref 1.9–3.7)
Glucose, Bld: 100 mg/dL — ABNORMAL HIGH (ref 65–99)
Potassium: 4.4 mmol/L (ref 3.5–5.3)
Sodium: 139 mmol/L (ref 135–146)
Total Bilirubin: 0.6 mg/dL (ref 0.2–1.2)
Total Protein: 6.9 g/dL (ref 6.1–8.1)

## 2018-08-02 LAB — URINALYSIS, ROUTINE W REFLEX MICROSCOPIC
BILIRUBIN URINE: NEGATIVE
GLUCOSE, UA: NEGATIVE
Hgb urine dipstick: NEGATIVE
Ketones, ur: NEGATIVE
Leukocytes,Ua: NEGATIVE
Nitrite: NEGATIVE
PROTEIN: NEGATIVE
Specific Gravity, Urine: 1.019 (ref 1.001–1.03)
pH: 5.5 (ref 5.0–8.0)

## 2018-08-02 LAB — CARBAMAZEPINE LEVEL, TOTAL: Carbamazepine Lvl: 6.4 mg/L (ref 4.0–12.0)

## 2018-08-02 LAB — LIPID PANEL
CHOL/HDL RATIO: 4.1 (calc) (ref <5.0)
Cholesterol: 176 mg/dL (ref <200)
HDL: 43 mg/dL (ref >=40)
LDL Cholesterol (Calc): 104 mg/dL (calc) — ABNORMAL HIGH
Non-HDL Cholesterol (Calc): 133 mg/dL (calc) — ABNORMAL HIGH (ref <130)
Triglycerides: 176 mg/dL — ABNORMAL HIGH (ref <150)

## 2018-08-02 LAB — MICROALBUMIN / CREATININE URINE RATIO
Creatinine, Urine: 137 mg/dL (ref 20–320)
Microalb Creat Ratio: 2 mcg/mg creat (ref <30)
Microalb, Ur: 0.3 mg/dL

## 2018-08-02 LAB — MAGNESIUM: Magnesium: 2 mg/dL (ref 1.5–2.5)

## 2018-08-02 LAB — INSULIN, RANDOM: Insulin: 7.6 u[IU]/mL

## 2018-08-02 LAB — TSH: TSH: 3.49 mIU/L (ref 0.40–4.50)

## 2018-08-02 LAB — VITAMIN D 25 HYDROXY (VIT D DEFICIENCY, FRACTURES): Vit D, 25-Hydroxy: 66 ng/mL (ref 30–100)

## 2018-08-02 LAB — PSA: PSA: 0.4 ng/mL (ref <=4.0)

## 2018-08-03 ENCOUNTER — Encounter: Payer: Self-pay | Admitting: Internal Medicine

## 2018-08-12 DIAGNOSIS — Z1211 Encounter for screening for malignant neoplasm of colon: Secondary | ICD-10-CM | POA: Diagnosis not present

## 2018-08-12 DIAGNOSIS — Z1212 Encounter for screening for malignant neoplasm of rectum: Secondary | ICD-10-CM | POA: Diagnosis not present

## 2018-08-15 LAB — COLOGUARD: Cologuard: POSITIVE — AB

## 2018-08-22 ENCOUNTER — Ambulatory Visit: Payer: Self-pay | Admitting: Adult Health

## 2018-09-04 DIAGNOSIS — R195 Other fecal abnormalities: Secondary | ICD-10-CM | POA: Diagnosis not present

## 2018-09-04 DIAGNOSIS — R569 Unspecified convulsions: Secondary | ICD-10-CM | POA: Diagnosis not present

## 2018-09-04 DIAGNOSIS — I2510 Atherosclerotic heart disease of native coronary artery without angina pectoris: Secondary | ICD-10-CM | POA: Diagnosis not present

## 2018-09-09 ENCOUNTER — Encounter: Payer: Self-pay | Admitting: Gastroenterology

## 2018-09-10 ENCOUNTER — Telehealth: Payer: Self-pay | Admitting: *Deleted

## 2018-09-10 NOTE — Telephone Encounter (Signed)
Patient cleared for cardio clearance for colonoscopy, per Dr Melford Aase. Note faxed to [facility] at [phone].

## 2018-09-11 DIAGNOSIS — K5730 Diverticulosis of large intestine without perforation or abscess without bleeding: Secondary | ICD-10-CM | POA: Diagnosis not present

## 2018-09-11 DIAGNOSIS — K635 Polyp of colon: Secondary | ICD-10-CM | POA: Diagnosis not present

## 2018-09-11 DIAGNOSIS — R195 Other fecal abnormalities: Secondary | ICD-10-CM | POA: Diagnosis not present

## 2018-09-11 DIAGNOSIS — D123 Benign neoplasm of transverse colon: Secondary | ICD-10-CM | POA: Diagnosis not present

## 2018-09-11 LAB — HM COLONOSCOPY

## 2018-09-19 ENCOUNTER — Encounter: Payer: Self-pay | Admitting: *Deleted

## 2018-09-20 ENCOUNTER — Other Ambulatory Visit: Payer: Self-pay | Admitting: Adult Health

## 2018-09-20 DIAGNOSIS — I10 Essential (primary) hypertension: Secondary | ICD-10-CM

## 2018-11-04 NOTE — Progress Notes (Signed)
MEDICARE ANNUAL WELLNESS VISIT AND FOLLOW UP Assessment:   Diagnoses and all orders for this visit:  Encounter for Medicare annual wellness exam  Essential hypertension Continue medications - reminded to take medications consistently Elevated today, but forgot to take medication this AM Monitor blood pressure at home; call if consistently over 130/80 Continue DASH diet.   Reminder to go to the ER if any CP, SOB, nausea, dizziness, severe HA, changes vision/speech, left arm numbness and tingling and jaw pain.  Coronary artery disease without angina pectoris, unspecified vessel or lesion type, unspecified whether native or transplanted heart Control blood pressure, cholesterol, glucose, increase exercise.  Lost to follow up with cardiology, had normal myoview in in 2013, he is very active with no angina or other concerns, medical risk management continued through this office, will refer back if needed  Seizure disorder ([place]) Well controlled by carbamazepine with last seizure remote Continue medication, check levels as needed  Vitamin D deficiency At goal at recent check; continue to recommend supplementation for goal of 70-100 Defer vitamin D level  Prediabetes Discussed disease and risks Discussed diet/exercise, weight management  Check A1C, insulin levels routinely  Obesity (BMI 30.0-34.9) Long discussion about weight loss, diet, and exercise Recommended diet heavy in fruits and veggies and low in animal meats, cheeses, and dairy products, appropriate calorie intake Discussed appropriate weight for height Follow up at next visit  Medication management Check CBC, CMP/GFR  Mixed hyperlipidemia Continue medications: crestor Continue low cholesterol diet and exercise.  Check lipid panel.   History of colon polyps Follow up colonoscopy due in 2023-2024 with Dr. Benson Norway   Over 30 minutes of exam, counseling, chart review, and critical decision making was performed  Future  Appointments  Date Time Provider [place]  02/11/2019 10:30 AM [name], MD [facility] None  08/21/2019 10:00 AM [name], MD [facility] None     Plan:   During the course of the visit the patient was educated and counseled about appropriate screening and preventive services including:    Pneumococcal vaccine   Influenza vaccine  Prevnar 13  Td vaccine  Screening electrocardiogram  Colorectal cancer screening  Diabetes screening  Glaucoma screening  Nutrition counseling    Subjective:  [name] is a 68 y.o. male who presents for Medicare Annual Wellness Visit and 3 month follow up for HTN, hyperlipidemia, prediabetes, and vitamin D Def.    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   He is seeing Dr. Berenice Primas for bilateral shoulder pain, worse on left, has had cortisone shot which did help, uses OTC analgesics, takes ibuprofen 400 mg at night. Doesn't need surgery per ortho.   BMI is Body mass index is 31.79 kg/m., he has been working on diet and exercise, walks 2 miles daily. Wt Readings from Last 3 Encounters:  11/07/18 206 lb (93.4 kg)  08/01/18 204 lb 12.8 oz (92.9 kg)  03/28/18 202 lb (91.6 kg)   In 2008, patient underwent an emergent CABG and has done well since.  He had an ETT-myoview in 5/13 that was a normal study.  His blood pressure has been controlled at home (130s/70s), today their BP is BP: (!) 176/90  He does workout. He denies chest pain, shortness of breath, dizziness.   He is on cholesterol medication (crestor 20 mg daily) and denies myalgias. His LDL cholesterol is not at  goal; his triglycerides remain elevated. The cholesterol last visit was:   Lab Results  Component Value Date   CHOL 176 08/01/2018   HDL 43 08/01/2018   LDLCALC 104 (H) 08/01/2018   TRIG 176 (H) 08/01/2018    CHOLHDL 4.1 08/01/2018   He has been working on diet and exercise for prediabetes, and denies foot ulcerations, increased appetite, nausea, paresthesia of the feet, polydipsia, polyuria, visual disturbances, vomiting and weight loss. Last A1C in the office was:  Lab Results  Component Value Date   HGBA1C 5.7 (H) 08/01/2018   Last GFR Lab Results  Component Value Date   GFRNONAA 69 08/01/2018    Patient is on Vitamin D supplement and at goal at recent check:    Lab Results  Component Value Date   VD25OH 66 08/01/2018      Medication Review: Current Outpatient Medications on File Prior to Visit  Medication Sig Dispense Refill  . aspirin 81 MG tablet Take 81 mg by mouth daily.      . carbamazepine (CARBATROL) 300 MG 12 hr capsule Take 1 tablet 2 x /day to prevent Seizures 180 capsule 3  . Cholecalciferol (VITAMIN D PO) Take 10,000 Units by mouth daily.      . fish oil-omega-3 fatty acids 1000 MG capsule Take 1 g by mouth daily.     . LUTEIN PO Take by mouth daily.    . Magnesium 250 MG TABS Take 250 mg by mouth daily.    . metoprolol succinate (TOPROL-XL) 100 MG 24 hr tablet TAKE 1 TABLET BY MOUTH ONCE DAILY FOR BLOOD PRESSURE 90 tablet 1  . rosuvastatin (CRESTOR) 40 MG tablet TAKE 1 TABLET BY MOUTH  DAILY 90 tablet 1   No current facility-administered medications on file prior to visit.     Allergies: Allergies  Allergen Reactions  . Lipitor [Atorvastatin]     myalgia  . Niacin And Related Other (See Comments)    Pt unaware of this intolerance (10/31/13)    Current Problems (verified) has Seizure disorder ([place]); Hyperlipidemia; Hypertension; CAD (coronary artery disease); Vitamin D deficiency; Medication management; Obesity (BMI 30.0-34.9); Other abnormal glucose (prediabetes); and Encounter for Medicare annual wellness exam on their problem list.  Screening Tests Immunization History  Administered Date(s) Administered  . Influenza, High Dose Seasonal PF 02/23/2015,  04/21/2017, 03/28/2018  . Pneumococcal Conjugate-13 12/17/2015  . Pneumococcal Polysaccharide-23 04/22/2009  . Td 04/22/2009  . Zoster 09/09/2009   Preventative care: Last colonoscopy: cologuard 07/2018 positive, had colonoscopy 09/11/2018, 3 polyps, follow up 08/2021 Echo 2008 CXR 2008 AB [place] 2005  Prior vaccinations: TD or Tdap: 2010 due 04/2019  Influenza: 2019 Pneumococcal: 2010 Prevnar13: 2017 Shingles/Zostavax: 2011  Names of Other Physician/Practitioners you currently use: 1. [facility] here for primary care 2. Dr. Delman Cheadle, eye doctor, last visit 2019 3. Dr. Olena Heckle, dentist, last visit 2019  Patient Care Team: [name], MD as PCP - General (Internal Medicine) Larey Dresser, MD as Consulting Physician (Cardiology)  Surgical: He  has a past surgical history that includes Coronary artery bypass graft (08/2006); Cholecystectomy; and Hand surgery. Family His family history includes Diabetes in his father; Hypertension in his mother; Lupus in his sister; Stroke in his mother. Social history  He reports that he quit smoking about 50 years ago. His smoking use included cigarettes. He has never used smokeless tobacco. He reports that he does not drink alcohol or use drugs.  MEDICARE WELLNESS OBJECTIVES: Physical activity: Current Exercise Habits:  Home exercise routine, Type of exercise: walking, Time (Minutes): 30, Frequency (Times/Week): 7, Weekly Exercise (Minutes/Week): 210, Intensity: Mild, Exercise limited by: None identified Cardiac risk factors: Cardiac Risk Factors include: advanced age (>74men, >45 women);male gender;hypertension;dyslipidemia;obesity (BMI >30kg/m2) Depression/mood screen:   Depression screen [facility] 2/9 11/07/2018  Decreased Interest 0  Down, Depressed, Hopeless 0  PHQ - 2 Score 0    ADLs:  In your present state of health, do you have any difficulty performing the following activities: 11/07/2018 08/03/2018   Hearing? N N  Vision? N N  Difficulty concentrating or making decisions? N N  Walking or climbing stairs? N N  Dressing or bathing? N N  Doing errands, shopping? N -  Some recent data might be hidden     Cognitive Testing  Alert? Yes  Normal Appearance?Yes  Oriented to person? Yes  Place? Yes   Time? Yes  Recall of three objects?  Yes  Can perform simple calculations? Yes  Displays appropriate judgment?Yes  Can read the correct time from a watch face?Yes  EOL planning: Does Patient Have a Medical Advance Directive?: Yes Type of Advance Directive: Living will Does patient want to make changes to medical advance directive?: No - Patient declined   Objective:   Today's Vitals   11/07/18 1003  BP: (!) 176/90  Pulse: (!) 49  Temp: (!) 97.3 F (36.3 C)  SpO2: 97%  Weight: 206 lb (93.4 kg)   Body mass index is 31.79 kg/m.  General appearance: alert, no distress, WD/WN, male HEENT: normocephalic, sclerae anicteric, TMs pearly, nares patent, no discharge or erythema, pharynx normal Oral cavity: MMM, no lesions Neck: supple, no lymphadenopathy, no thyromegaly, no masses Heart: RRR, normal S1, S2, no murmurs Lungs: CTA bilaterally, no wheezes, rhonchi, or rales Abdomen: +bs, soft, non tender, non distended, no masses, no hepatomegaly, no splenomegaly Musculoskeletal: nontender, no swelling, no obvious deformity Extremities: no edema, no cyanosis, no clubbing Pulses: 2+ symmetric, upper and lower extremities, normal cap refill Neurological: alert, oriented x 3, CN2-12 intact, strength normal upper extremities and lower extremities, sensation normal throughout, DTRs 2+ throughout, no cerebellar signs, gait normal Psychiatric: normal affect, behavior normal, pleasant   Medicare Attestation I have personally reviewed: The patient's medical and social history Their use of alcohol, tobacco or illicit drugs Their current medications and supplements The patient's functional  ability including ADLs,fall risks, home safety risks, cognitive, and hearing and visual impairment Diet and physical activities Evidence for depression or mood disorders  The patient's weight, height, BMI, and visual acuity have been recorded in the chart.  I have made referrals, counseling, and provided education to the patient based on review of the above and I have provided the patient with a written personalized care plan for preventive services.     Izora Ribas, NP   11/07/2018

## 2018-11-07 ENCOUNTER — Other Ambulatory Visit: Payer: Self-pay

## 2018-11-07 ENCOUNTER — Encounter: Payer: Self-pay | Admitting: Adult Health

## 2018-11-07 ENCOUNTER — Ambulatory Visit (INDEPENDENT_AMBULATORY_CARE_PROVIDER_SITE_OTHER): Payer: Medicare Other | Admitting: Adult Health

## 2018-11-07 VITALS — BP 142/78 | HR 49 | Temp 97.3°F | Wt 206.0 lb

## 2018-11-07 DIAGNOSIS — I2510 Atherosclerotic heart disease of native coronary artery without angina pectoris: Secondary | ICD-10-CM

## 2018-11-07 DIAGNOSIS — E669 Obesity, unspecified: Secondary | ICD-10-CM

## 2018-11-07 DIAGNOSIS — E559 Vitamin D deficiency, unspecified: Secondary | ICD-10-CM

## 2018-11-07 DIAGNOSIS — R6889 Other general symptoms and signs: Secondary | ICD-10-CM

## 2018-11-07 DIAGNOSIS — G40909 Epilepsy, unspecified, not intractable, without status epilepticus: Secondary | ICD-10-CM

## 2018-11-07 DIAGNOSIS — Z860100 Personal history of colon polyps, unspecified: Secondary | ICD-10-CM | POA: Insufficient documentation

## 2018-11-07 DIAGNOSIS — Z79899 Other long term (current) drug therapy: Secondary | ICD-10-CM | POA: Diagnosis not present

## 2018-11-07 DIAGNOSIS — E782 Mixed hyperlipidemia: Secondary | ICD-10-CM | POA: Diagnosis not present

## 2018-11-07 DIAGNOSIS — I10 Essential (primary) hypertension: Secondary | ICD-10-CM

## 2018-11-07 DIAGNOSIS — R7309 Other abnormal glucose: Secondary | ICD-10-CM | POA: Diagnosis not present

## 2018-11-07 DIAGNOSIS — Z0001 Encounter for general adult medical examination with abnormal findings: Secondary | ICD-10-CM | POA: Diagnosis not present

## 2018-11-07 DIAGNOSIS — E66811 Obesity, class 1: Secondary | ICD-10-CM

## 2018-11-07 DIAGNOSIS — Z0000 Encounter for general adult medical examination without abnormal findings: Secondary | ICD-10-CM

## 2018-11-07 NOTE — Patient Instructions (Addendum)
[name] , Thank you for taking time to come for your Medicare Wellness Visit. I appreciate your ongoing commitment to your health goals. Please review the following plan we discussed and let me know if I can assist you in the future.   These are the goals we discussed: Goals    . Blood Pressure < 140/80       This is a list of the screening recommended for you and due dates:  Health Maintenance  Topic Date Due  . Flu Shot  12/21/2018  . Tetanus Vaccine  04/23/2019  . Colon Cancer Screening  09/10/2021  .  Hepatitis C: One time screening is recommended by Center for Disease Control  (CDC) for  adults born from [birth year] through 1967.   Completed  . Pneumonia vaccines  Discontinued      Plantar Fasciitis  Plantar fasciitis is a painful foot condition that affects the heel. It occurs when the band of tissue that connects the toes to the heel bone (plantar fascia) becomes irritated. This can happen as the result of exercising too much or doing other repetitive activities (overuse injury). The pain from plantar fasciitis can range from mild irritation to severe pain that makes it difficult to walk or move. The pain is usually worse in the morning after sleeping, or after sitting or lying down for a while. Pain may also be worse after long periods of walking or standing. What are the causes? This condition may be caused by:  Standing for long periods of time.  Wearing shoes that do not have good arch support.  Doing activities that put stress on joints (high-impact activities), including running, aerobics, and ballet.  Being overweight.  An abnormal way of walking (gait).  Tight muscles in the back of your lower leg (calf).  High arches in your feet.  Starting a new athletic activity. What are the signs or symptoms? The main symptom of this condition is heel pain. Pain may:  Be worse with first steps after a time of rest, especially in the morning after sleeping or after you  have been sitting or lying down for a while.  Be worse after long periods of standing still.  Decrease after 30-45 minutes of activity, such as gentle walking. How is this diagnosed? This condition may be diagnosed based on your medical history and your symptoms. Your health care provider may ask questions about your activity level. Your health care provider will do a physical exam to check for:  A tender area on the bottom of your foot.  A high arch in your foot.  Pain when you move your foot.  Difficulty moving your foot. You may have imaging tests to confirm the diagnosis, such as:  X-rays.  Ultrasound.  MRI. How is this treated? Treatment for plantar fasciitis depends on how severe your condition is. Treatment may include:  Rest, ice, applying pressure (compression), and raising the affected foot (elevation). This may be called RICE therapy. Your health care provider may recommend RICE therapy along with over-the-counter pain medicines to manage your pain.  Exercises to stretch your calves and your plantar fascia.  A splint that holds your foot in a stretched, upward position while you sleep (night splint).  Physical therapy to relieve symptoms and prevent problems in the future.  Injections of steroid medicine (cortisone) to relieve pain and inflammation.  Stimulating your plantar fascia with electrical impulses (extracorporeal shock wave therapy). This is usually the last treatment option before surgery.  Surgery,  if other treatments have not worked after 12 months. Follow these instructions at home:  Managing pain, stiffness, and swelling  If directed, put ice on the painful area: ? Put ice in a plastic bag, or use a frozen bottle of water. ? Place a towel between your skin and the bag or bottle. ? Roll the bottom of your foot over the bag or bottle. ? Do this for 20 minutes, 2-3 times a day.  Wear athletic shoes that have air-sole or gel-sole cushions, or try  wearing soft shoe inserts that are designed for plantar fasciitis.  Raise (elevate) your foot above the level of your heart while you are sitting or lying down. Activity  Avoid activities that cause pain. Ask your health care provider what activities are safe for you.  Do physical therapy exercises and stretches as told by your health care provider.  Try activities and forms of exercise that are easier on your joints (low-impact). Examples include swimming, water aerobics, and biking. General instructions  Take over-the-counter and prescription medicines only as told by your health care provider.  Wear a night splint while sleeping, if told by your health care provider. Loosen the splint if your toes tingle, become numb, or turn cold and blue.  Maintain a healthy weight, or work with your health care provider to lose weight as needed.  Keep all follow-up visits as told by your health care provider. This is important. Contact a health care provider if you:  Have symptoms that do not go away after caring for yourself at home.  Have pain that gets worse.  Have pain that affects your ability to move or do your daily activities. Summary  Plantar fasciitis is a painful foot condition that affects the heel. It occurs when the band of tissue that connects the toes to the heel bone (plantar fascia) becomes irritated.  The main symptom of this condition is heel pain that may be worse after exercising too much or standing still for a long time.  Treatment varies, but it usually starts with rest, ice, compression, and elevation (RICE therapy) and over-the-counter medicines to manage pain. This information is not intended to replace advice given to you by your health care provider. Make sure you discuss any questions you have with your health care provider. Document Released: 01/31/2001 Document Revised: 03/05/2017 Document Reviewed: 03/05/2017 Elsevier Interactive Patient Education  2019  [place].     Plantar Fasciitis Rehab Ask your health care provider which exercises are safe for you. Do exercises exactly as told by your health care provider and adjust them as directed. It is normal to feel mild stretching, pulling, tightness, or discomfort as you do these exercises, but you should stop right away if you feel sudden pain or your pain gets worse. Do not begin these exercises until told by your health care provider. Stretching and range of motion exercises These exercises warm up your muscles and joints and improve the movement and flexibility of your foot. These exercises also help to relieve pain. Exercise A: Plantar fascia stretch  1. Sit with your left / right leg crossed over your opposite knee. 2. Hold your heel with one hand with that thumb near your arch. With your other hand, hold your toes and gently pull them back toward the top of your foot. You should feel a stretch on the bottom of your toes or your foot or both. 3. Hold this stretch for__________ seconds. 4. Slowly release your toes and return to  the starting position. Repeat __________ times. Complete this exercise __________ times a day. Exercise B: Gastroc, standing  1. Stand with your hands against a wall. 2. Extend your left / right leg behind you, and bend your front knee slightly. 3. Keeping your heels on the floor and keeping your back knee straight, shift your weight toward the wall without arching your back. You should feel a gentle stretch in your left / right calf. 4. Hold this position for __________ seconds. Repeat __________ times. Complete this exercise __________ times a day. Exercise C: Soleus, standing 1. Stand with your hands against a wall. 2. Extend your left / right leg behind you, and bend your front knee slightly. 3. Keeping your heels on the floor, bend your back knee and slightly shift your weight over the back leg. You should feel a gentle stretch deep in your calf. 4. Hold  this position for __________ seconds. Repeat __________ times. Complete this exercise __________ times a day. Exercise D: Gastrocsoleus, standing 1. Stand with the ball of your left / right foot on a step. The ball of your foot is on the walking surface, right under your toes. 2. Keep your other foot firmly on the same step. 3. Hold onto the wall or a railing for balance. 4. Slowly lift your other foot, allowing your body weight to press your heel down over the edge of the step. You should feel a stretch in your left / right calf. 5. Hold this position for __________ seconds. 6. Return both feet to the step. 7. Repeat this exercise with a slight bend in your left / right knee. Repeat __________ times with your left / right knee straight and __________ times with your left / right knee bent. Complete this exercise __________ times a day. Balance exercise This exercise builds your balance and strength control of your arch to help take pressure off your plantar fascia. Exercise E: Single leg stand 1. Without shoes, stand near a railing or in a doorway. You may hold onto the railing or door frame as needed. 2. Stand on your left / right foot. Keep your big toe down on the floor and try to keep your arch lifted. Do not let your foot roll inward. 3. Hold this position for __________ seconds. 4. If this exercise is too easy, you can try it with your eyes closed or while standing on a pillow. Repeat __________ times. Complete this exercise __________ times a day. This information is not intended to replace advice given to you by your health care provider. Make sure you discuss any questions you have with your health care provider. Document Released: 05/08/2005 Document Revised: 01/11/2016 Document Reviewed: 03/22/2015 Elsevier Interactive Patient Education  2019 [employer].

## 2018-11-08 LAB — CBC WITH DIFFERENTIAL/PLATELET
Absolute Monocytes: 374 cells/uL (ref 200–950)
Basophils Absolute: 29 cells/uL (ref 0–200)
Basophils Relative: 0.8 %
Eosinophils Absolute: 61 cells/uL (ref 15–500)
Eosinophils Relative: 1.7 %
HCT: 42.5 % (ref 38.5–50.0)
Hemoglobin: 14.8 g/dL (ref 13.2–17.1)
Lymphs Abs: 1199 cells/uL (ref 850–3900)
MCH: 31.6 pg (ref 27.0–33.0)
MCHC: 34.8 g/dL (ref 32.0–36.0)
MCV: 90.6 fL (ref 80.0–100.0)
MPV: 10.2 fL (ref 7.5–12.5)
Monocytes Relative: 10.4 %
Neutro Abs: 1937 cells/uL (ref 1500–7800)
Neutrophils Relative %: 53.8 %
Platelets: 161 10*3/uL (ref 140–400)
RBC: 4.69 10*6/uL (ref 4.20–5.80)
RDW: 12.3 % (ref 11.0–15.0)
Total Lymphocyte: 33.3 %
WBC: 3.6 10*3/uL — ABNORMAL LOW (ref 3.8–10.8)

## 2018-11-08 LAB — HEMOGLOBIN A1C
Hgb A1c MFr Bld: 5.6 % of total Hgb (ref <5.7)
Mean Plasma Glucose: 114 (calc)
eAG (mmol/L): 6.3 (calc)

## 2018-11-08 LAB — TSH: TSH: 3.74 mIU/L (ref 0.40–4.50)

## 2018-11-08 LAB — COMPLETE METABOLIC PANEL WITH GFR
AG Ratio: 1.7 (calc) (ref 1.0–2.5)
ALT: 11 U/L (ref 9–46)
AST: 17 U/L (ref 10–35)
Albumin: 4.1 g/dL (ref 3.6–5.1)
Alkaline phosphatase (APISO): 60 U/L (ref 35–144)
BUN: 17 mg/dL (ref 7–25)
CO2: 28 mmol/L (ref 20–32)
Calcium: 9.2 mg/dL (ref 8.6–10.3)
Chloride: 103 mmol/L (ref 98–110)
Creat: 0.96 mg/dL (ref 0.70–1.18)
GFR, Est African American: 92 mL/min/{1.73_m2} (ref >=60)
GFR, Est Non African American: 80 mL/min/{1.73_m2} (ref >=60)
Globulin: 2.4 g/dL (calc) (ref 1.9–3.7)
Glucose, Bld: 105 mg/dL — ABNORMAL HIGH (ref 65–99)
Potassium: 4.4 mmol/L (ref 3.5–5.3)
Sodium: 138 mmol/L (ref 135–146)
Total Bilirubin: 0.4 mg/dL (ref 0.2–1.2)
Total Protein: 6.5 g/dL (ref 6.1–8.1)

## 2018-11-08 LAB — LIPID PANEL
Cholesterol: 151 mg/dL (ref <200)
HDL: 40 mg/dL (ref >=40)
LDL Cholesterol (Calc): 82 mg/dL (calc)
Non-HDL Cholesterol (Calc): 111 mg/dL (calc) (ref <130)
Total CHOL/HDL Ratio: 3.8 (calc) (ref <5.0)
Triglycerides: 193 mg/dL — ABNORMAL HIGH (ref <150)

## 2018-12-23 ENCOUNTER — Other Ambulatory Visit: Payer: Self-pay

## 2018-12-23 NOTE — Telephone Encounter (Signed)
Requesting a prescription for Cialis.

## 2018-12-24 NOTE — Telephone Encounter (Signed)
Patient was given years ago samples from Dr. Melford Aase. Would like to try it PRN. Please wait on sending in to pharmacy until he checks on prices.

## 2018-12-25 ENCOUNTER — Other Ambulatory Visit: Payer: Self-pay | Admitting: Adult Health

## 2018-12-25 MED ORDER — TADALAFIL 10 MG PO TABS: 10.0000 mg | ORAL_TABLET | Freq: Every day | ORAL | 0 refills | Status: DC | PRN

## 2018-12-25 NOTE — Telephone Encounter (Signed)
Please send in prescription to [facility] [place].

## 2018-12-26 ENCOUNTER — Other Ambulatory Visit: Payer: Self-pay

## 2018-12-26 MED ORDER — TADALAFIL 10 MG PO TABS: 10.0000 mg | ORAL_TABLET | Freq: Every day | ORAL | 0 refills | Status: DC | PRN

## 2018-12-26 NOTE — Telephone Encounter (Signed)
Cialis was over $400.00 at [facility], requesting new prescription be sent to [facility] instead. In que for signature.

## 2019-02-03 DIAGNOSIS — M722 Plantar fascial fibromatosis: Secondary | ICD-10-CM | POA: Diagnosis not present

## 2019-02-11 ENCOUNTER — Ambulatory Visit: Payer: Medicare Other | Admitting: Internal Medicine

## 2019-02-27 ENCOUNTER — Encounter: Payer: Self-pay | Admitting: Internal Medicine

## 2019-02-27 ENCOUNTER — Ambulatory Visit (INDEPENDENT_AMBULATORY_CARE_PROVIDER_SITE_OTHER): Payer: Medicare Other | Admitting: Internal Medicine

## 2019-02-27 ENCOUNTER — Other Ambulatory Visit: Payer: Self-pay

## 2019-02-27 VITALS — BP 136/82 | HR 47 | Temp 97.1°F | Resp 16 | Ht 67.5 in | Wt 200.6 lb

## 2019-02-27 DIAGNOSIS — E039 Hypothyroidism, unspecified: Secondary | ICD-10-CM | POA: Diagnosis not present

## 2019-02-27 DIAGNOSIS — E559 Vitamin D deficiency, unspecified: Secondary | ICD-10-CM

## 2019-02-27 DIAGNOSIS — I10 Essential (primary) hypertension: Secondary | ICD-10-CM

## 2019-02-27 DIAGNOSIS — I25810 Atherosclerosis of coronary artery bypass graft(s) without angina pectoris: Secondary | ICD-10-CM | POA: Diagnosis not present

## 2019-02-27 DIAGNOSIS — E782 Mixed hyperlipidemia: Secondary | ICD-10-CM | POA: Diagnosis not present

## 2019-02-27 DIAGNOSIS — Z23 Encounter for immunization: Secondary | ICD-10-CM | POA: Diagnosis not present

## 2019-02-27 DIAGNOSIS — R7309 Other abnormal glucose: Secondary | ICD-10-CM | POA: Diagnosis not present

## 2019-02-27 DIAGNOSIS — Z79899 Other long term (current) drug therapy: Secondary | ICD-10-CM

## 2019-02-27 MED ORDER — TADALAFIL 20 MG PO TABS: ORAL_TABLET | ORAL | 12 refills | Status: DC

## 2019-02-27 NOTE — Patient Instructions (Signed)

## 2019-02-27 NOTE — Progress Notes (Signed)
History of Present Illness:      This very nice 68 y.o. MWM presents for 6 month follow up with HTN, ASCAD, HLD, Pre-Diabetes and Vitamin D Deficiency. Patient is followed by Dr Jannifer Franklin for remote hx/o Seizures (1987) apparently controlled on Keppra.       Patient is treated for HTN (2004) & BP has been controlled at home. Today's BP is at goal - 136/82.  Patient underwent emergent CABG in 2008 and has done well since.  Patient has had no complaints of any cardiac type chest pain, palpitations, dyspnea / orthopnea / PND, dizziness, claudication, or dependent edema.      Hyperlipidemia is controlled with diet & meds. Patient denies myalgias or other med SE's. Last Lipids were at goal albeit sl elevated Trig's:  Lab Results  Component Value Date   CHOL 151 11/07/2018   HDL 40 11/07/2018   LDLCALC 82 11/07/2018   TRIG 193 (H) 11/07/2018   CHOLHDL 3.8 11/07/2018        Also, the patient has history of PreDiabetes ("A1c 5.7% / 2014)  and has had no symptoms of reactive hypoglycemia, diabetic polys, paresthesias or visual blurring.  Last A1c was Normal & at goal:  Lab Results  Component Value Date   HGBA1C 5.6 11/07/2018      Further, the patient also has history of Vitamin D Deficiency  ("24" / 2008)  and supplements vitamin D without any suspected side-effects. Last vitamin D was at goal:  Lab Results  Component Value Date   VD25OH 66 08/01/2018   Current Outpatient Medications on File Prior to Visit  Medication Sig  . aspirin 81 MG tablet Take 81 mg by mouth daily.    . carbamazepine (CARBATROL) 300 MG 12 hr capsule Take 1 tablet 2 x /day to prevent Seizures  . Cholecalciferol (VITAMIN D PO) Take 10,000 Units by mouth daily.    . fish oil-omega-3 fatty acids 1000 MG capsule Take 1 g by mouth daily.   . LUTEIN PO Take by mouth daily.  . Magnesium 250 MG TABS Take 250 mg by mouth daily.  . metoprolol succinate (TOPROL-XL) 100 MG 24 hr tablet TAKE 1 TABLET BY MOUTH ONCE DAILY  FOR BLOOD PRESSURE  . rosuvastatin (CRESTOR) 40 MG tablet TAKE 1 TABLET BY MOUTH  DAILY   No current facility-administered medications on file prior to visit.    Allergies  Allergen Reactions  . Lipitor [Atorvastatin]     myalgia  . Niacin And Related Other (See Comments)    Pt unaware of this intolerance (10/31/13)   PMHx:   Past Medical History:  Diagnosis Date  . Bilateral carpal tunnel syndrome   . Dyslipidemia   . Hyperlipidemia   . Ischemic heart disease   . Plantar fasciitis   . Seizure disorder ([place])   . Vitamin D deficiency    Immunization History  Administered Date(s) Administered  . Influenza, High Dose Seasonal PF 02/23/2015, 04/21/2017, 03/28/2018, 02/27/2019  . Pneumococcal Conjugate-13 12/17/2015  . Pneumococcal Polysaccharide-23 04/22/2009  . Td 04/22/2009  . Zoster 09/09/2009   Past Surgical History:  Procedure Laterality Date  . CHOLECYSTECTOMY    . CORONARY ARTERY BYPASS GRAFT  08/2006  . HAND SURGERY     FHx:    Reviewed / unchanged  SHx:    Reviewed / unchanged   Systems Review:  Constitutional: Denies fever, chills, wt changes, headaches, insomnia, fatigue, night sweats, change in appetite. Eyes: Denies redness, blurred vision,  diplopia, discharge, itchy, watery eyes.  ENT: Denies discharge, congestion, post nasal drip, epistaxis, sore throat, earache, hearing loss, dental pain, tinnitus, vertigo, sinus pain, snoring.  CV: Denies chest pain, palpitations, irregular heartbeat, syncope, dyspnea, diaphoresis, orthopnea, PND, claudication or edema. Respiratory: denies cough, dyspnea, DOE, pleurisy, hoarseness, laryngitis, wheezing.  Gastrointestinal: Denies dysphagia, odynophagia, heartburn, reflux, water brash, abdominal pain or cramps, nausea, vomiting, bloating, diarrhea, constipation, hematemesis, melena, hematochezia  or hemorrhoids. Genitourinary: Denies dysuria, frequency, urgency, nocturia, hesitancy, discharge, hematuria or flank pain.  Musculoskeletal: Denies arthralgias, myalgias, stiffness, jt. swelling, pain, limping or strain/sprain.  Skin: Denies pruritus, rash, hives, warts, acne, eczema or change in skin lesion(s). Neuro: No weakness, tremor, incoordination, spasms, paresthesia or pain. Psychiatric: Denies confusion, memory loss or sensory loss. Endo: Denies change in weight, skin or hair change.  Heme/Lymph: No excessive bleeding, bruising or enlarged lymph nodes.  Physical Exam  BP 136/82   Pulse (!) 47   Temp (!) 97.1 F (36.2 C)   Resp 16   Ht 5' 7.5" (1.715 m)   Wt 200 lb 9.6 oz (91 kg)   BMI 30.95 kg/m   Appears  well nourished, well groomed  and in no distress.  Eyes: PERRLA, EOMs, conjunctiva no swelling or erythema. Sinuses: No frontal/maxillary tenderness ENT/Mouth: EAC's clear, TM's nl w/o erythema, bulging. Nares clear w/o erythema, swelling, exudates. Oropharynx clear without erythema or exudates. Oral hygiene is good. Tongue normal, non obstructing. Hearing intact.  Neck: Supple. Thyroid not palpable. Car 2+/2+ without bruits, nodes or JVD. Chest: Respirations nl with BS clear & equal w/o rales, rhonchi, wheezing or stridor.  Cor: Heart sounds normal w/ regular rate and rhythm without sig. murmurs, gallops, clicks or rubs. Peripheral pulses normal and equal  without edema.  Abdomen: Soft & bowel sounds normal. Non-tender w/o guarding, rebound, hernias, masses or organomegaly.  Lymphatics: Unremarkable.  Musculoskeletal: Full ROM all peripheral extremities, joint stability, 5/5 strength and normal gait.  Skin: Warm, dry without exposed rashes, lesions or ecchymosis apparent.  Neuro: Cranial nerves intact, reflexes equal bilaterally. Sensory-motor testing grossly intact. Tendon reflexes grossly intact.  Pysch: Alert & oriented x 3.  Insight and judgement nl & appropriate. No ideations.  Assessment and Plan:  1. Essential hypertension  - Continue medication, monitor blood pressure at home.   - Continue DASH diet.  Reminder to go to the ER if any CP,  SOB, nausea, dizziness, severe HA, changes vision/speech.  - CBC with Differential/Platelet - COMPLETE METABOLIC PANEL WITH GFR - Magnesium - TSH  2. Hyperlipidemia, mixed  - Continue diet/meds, exercise,& lifestyle modifications.  - Continue monitor periodic cholesterol/liver & renal functions   - Lipid panel - TSH  3. Abnormal glucose  - Continue diet, exercise  - Lifestyle modifications.  - Monitor appropriate labs.  - Hemoglobin A1c - Insulin, random  4. Vitamin D deficiency  - Continue supplementation.  - VITAMIN D 25 Hydroxyl  5. Hypothyroidism  - TSH  6. Coronary artery disease involving coronary bypass graft of native heart without angina pectoris  - Lipid panel  7. Medication management  - CBC with Differential/Platelet - COMPLETE METABOLIC PANEL WITH GFR - Magnesium - Lipid panel - TSH - Hemoglobin A1c - Insulin, random - VITAMIN D 25 Hydroxyl  8. Need for immunization against influenza  - Flu vaccine HIGH DOSE PF (Fluzone High dose)       Discussed  regular exercise, BP monitoring, weight control to achieve/maintain BMI less than 25 and discussed med and SE's. Recommended  labs to assess and monitor clinical status with further disposition pending results of labs.  I discussed the assessment and treatment plan with the patient. The patient was provided an opportunity to ask questions and all were answered. The patient agreed with the plan and demonstrated an understanding of the instructions.  I provided over 30 minutes of exam, counseling, chart review and  complex critical decision making.  Kirtland Bouchard, MD

## 2019-02-28 LAB — CBC WITH DIFFERENTIAL/PLATELET
Absolute Monocytes: 400 cells/uL (ref 200–950)
Basophils Absolute: 32 cells/uL (ref 0–200)
Basophils Relative: 0.8 %
Eosinophils Absolute: 80 cells/uL (ref 15–500)
Eosinophils Relative: 2 %
HCT: 42.7 % (ref 38.5–50.0)
Hemoglobin: 14.7 g/dL (ref 13.2–17.1)
Lymphs Abs: 1252 cells/uL (ref 850–3900)
MCH: 31.7 pg (ref 27.0–33.0)
MCHC: 34.4 g/dL (ref 32.0–36.0)
MCV: 92.2 fL (ref 80.0–100.0)
MPV: 10.1 fL (ref 7.5–12.5)
Monocytes Relative: 10 %
Neutro Abs: 2236 cells/uL (ref 1500–7800)
Neutrophils Relative %: 55.9 %
Platelets: 174 10*3/uL (ref 140–400)
RBC: 4.63 10*6/uL (ref 4.20–5.80)
RDW: 12.4 % (ref 11.0–15.0)
Total Lymphocyte: 31.3 %
WBC: 4 10*3/uL (ref 3.8–10.8)

## 2019-02-28 LAB — LIPID PANEL
Cholesterol: 159 mg/dL (ref <200)
HDL: 46 mg/dL (ref >=40)
LDL Cholesterol (Calc): 88 mg/dL (calc)
Non-HDL Cholesterol (Calc): 113 mg/dL (calc) (ref <130)
Total CHOL/HDL Ratio: 3.5 (calc) (ref <5.0)
Triglycerides: 148 mg/dL (ref <150)

## 2019-02-28 LAB — COMPLETE METABOLIC PANEL WITH GFR
AG Ratio: 1.8 (calc) (ref 1.0–2.5)
ALT: 18 U/L (ref 9–46)
AST: 23 U/L (ref 10–35)
Albumin: 4.4 g/dL (ref 3.6–5.1)
Alkaline phosphatase (APISO): 68 U/L (ref 35–144)
BUN: 16 mg/dL (ref 7–25)
CO2: 29 mmol/L (ref 20–32)
Calcium: 9.4 mg/dL (ref 8.6–10.3)
Chloride: 105 mmol/L (ref 98–110)
Creat: 1.02 mg/dL (ref 0.70–1.18)
GFR, Est African American: 86 mL/min/{1.73_m2} (ref >=60)
GFR, Est Non African American: 74 mL/min/{1.73_m2} (ref >=60)
Globulin: 2.4 g/dL (calc) (ref 1.9–3.7)
Glucose, Bld: 108 mg/dL — ABNORMAL HIGH (ref 65–99)
Potassium: 4.2 mmol/L (ref 3.5–5.3)
Sodium: 140 mmol/L (ref 135–146)
Total Bilirubin: 0.5 mg/dL (ref 0.2–1.2)
Total Protein: 6.8 g/dL (ref 6.1–8.1)

## 2019-02-28 LAB — HEMOGLOBIN A1C
Hgb A1c MFr Bld: 5.5 %{Hb}
Mean Plasma Glucose: 111 (calc)
eAG (mmol/L): 6.2 (calc)

## 2019-02-28 LAB — VITAMIN D 25 HYDROXY (VIT D DEFICIENCY, FRACTURES): Vit D, 25-Hydroxy: 66 ng/mL (ref 30–100)

## 2019-02-28 LAB — INSULIN, RANDOM: Insulin: 3.2 u[IU]/mL

## 2019-02-28 LAB — MAGNESIUM: Magnesium: 2 mg/dL (ref 1.5–2.5)

## 2019-02-28 LAB — TSH: TSH: 4.07 mIU/L (ref 0.40–4.50)

## 2019-03-01 ENCOUNTER — Encounter: Payer: Self-pay | Admitting: Internal Medicine

## 2019-03-12 ENCOUNTER — Other Ambulatory Visit: Payer: Self-pay | Admitting: Adult Health

## 2019-03-12 DIAGNOSIS — I10 Essential (primary) hypertension: Secondary | ICD-10-CM

## 2019-04-01 ENCOUNTER — Other Ambulatory Visit: Payer: Self-pay | Admitting: Internal Medicine

## 2019-04-01 MED ORDER — TADALAFIL 20 MG PO TABS: ORAL_TABLET | ORAL | 99 refills | Status: DC

## 2019-06-21 ENCOUNTER — Ambulatory Visit: Payer: Medicare Other

## 2019-06-26 ENCOUNTER — Ambulatory Visit: Payer: Medicare Other | Attending: Internal Medicine

## 2019-06-26 DIAGNOSIS — Z23 Encounter for immunization: Secondary | ICD-10-CM

## 2019-06-26 NOTE — Progress Notes (Signed)
   Covid-19 Vaccination Clinic  Name:  [name]    MRN: [id number] DOB: 1950-07-07  06/26/2019  [name] was observed post Covid-19 immunization for 15 minutes without incidence. He was provided with Vaccine Information Sheet and instruction to access the V-Safe system.   [name] was instructed to call 911 with any severe reactions post vaccine: Marland Kitchen Difficulty breathing  . Swelling of your face and throat  . A fast heartbeat  . A bad rash all over your body  . Dizziness and weakness    Immunizations Administered    Name Date Dose VIS Date Route   Pfizer COVID-19 Vaccine 06/26/2019  9:38 AM 0.3 mL 05/02/2019 Intramuscular   Manufacturer: [place]   Lot: [id number]   [place]: [id number]

## 2019-07-01 ENCOUNTER — Ambulatory Visit: Payer: Medicare Other | Admitting: Adult Health Nurse Practitioner

## 2019-07-02 ENCOUNTER — Ambulatory Visit: Payer: Medicare Other

## 2019-07-17 ENCOUNTER — Ambulatory Visit (INDEPENDENT_AMBULATORY_CARE_PROVIDER_SITE_OTHER): Payer: Medicare Other | Admitting: Adult Health Nurse Practitioner

## 2019-07-17 ENCOUNTER — Other Ambulatory Visit: Payer: Self-pay

## 2019-07-17 ENCOUNTER — Encounter: Payer: Self-pay | Admitting: Adult Health Nurse Practitioner

## 2019-07-17 VITALS — BP 130/72 | HR 63 | Temp 97.0°F | Wt 203.0 lb

## 2019-07-17 DIAGNOSIS — E782 Mixed hyperlipidemia: Secondary | ICD-10-CM | POA: Diagnosis not present

## 2019-07-17 DIAGNOSIS — E669 Obesity, unspecified: Secondary | ICD-10-CM

## 2019-07-17 DIAGNOSIS — Z79899 Other long term (current) drug therapy: Secondary | ICD-10-CM

## 2019-07-17 DIAGNOSIS — Z23 Encounter for immunization: Secondary | ICD-10-CM

## 2019-07-17 DIAGNOSIS — R7309 Other abnormal glucose: Secondary | ICD-10-CM | POA: Diagnosis not present

## 2019-07-17 DIAGNOSIS — I10 Essential (primary) hypertension: Secondary | ICD-10-CM

## 2019-07-17 DIAGNOSIS — Z860100 Personal history of colon polyps, unspecified: Secondary | ICD-10-CM

## 2019-07-17 DIAGNOSIS — S61419A Laceration without foreign body of unspecified hand, initial encounter: Secondary | ICD-10-CM

## 2019-07-17 DIAGNOSIS — G40909 Epilepsy, unspecified, not intractable, without status epilepticus: Secondary | ICD-10-CM

## 2019-07-17 DIAGNOSIS — E559 Vitamin D deficiency, unspecified: Secondary | ICD-10-CM

## 2019-07-17 DIAGNOSIS — N401 Enlarged prostate with lower urinary tract symptoms: Secondary | ICD-10-CM

## 2019-07-17 DIAGNOSIS — E66811 Obesity, class 1: Secondary | ICD-10-CM

## 2019-07-17 DIAGNOSIS — R7989 Other specified abnormal findings of blood chemistry: Secondary | ICD-10-CM

## 2019-07-17 DIAGNOSIS — I25810 Atherosclerosis of coronary artery bypass graft(s) without angina pectoris: Secondary | ICD-10-CM | POA: Diagnosis not present

## 2019-07-17 DIAGNOSIS — N138 Other obstructive and reflux uropathy: Secondary | ICD-10-CM

## 2019-07-17 NOTE — Patient Instructions (Addendum)
You received the Tdap vaccination today.  This is good for 10 years.    Td (Tetanus, Diphtheria) Vaccine: What You Need to Know 1. Why get vaccinated? Td vaccine can prevent tetanus and diphtheria. Tetanus enters the body through cuts or wounds. Diphtheria spreads from person to person.  TETANUS (T) causes painful stiffening of the muscles. Tetanus can lead to serious health problems, including being unable to open the mouth, having trouble swallowing and breathing, or death.  DIPHTHERIA (D) can lead to difficulty breathing, heart failure, paralysis, or death. 2. Td vaccine Td is only for children 7 years and older, adolescents, and adults.  Td is usually given as a booster dose every 10 years, but it can also be given earlier after a severe and dirty wound or burn. Another vaccine, called Tdap, that protects against pertussis, also known as "whooping cough," in addition to tetanus and diphtheria, may be used instead of Td.  Td may be given at the same time as other vaccines. 3. Talk with your health care provider Tell your vaccine provider if the person getting the vaccine:  Has had an allergic reaction after a previous dose of any vaccine that protects against tetanus or diphtheria, or has any severe, life-threatening allergies.  Has ever had Guillain-Barr Syndrome (also called GBS).  Has had severe pain or swelling after a previous dose of any vaccine that protects against tetanus or diphtheria. In some cases, your health care provider may decide to postpone Td vaccination to a future visit.  People with minor illnesses, such as a cold, may be vaccinated. People who are moderately or severely ill should usually wait until they recover before getting Td vaccine.  Your health care provider can give you more information. 4. Risks of a vaccine reaction  Pain, redness, or swelling where the shot was given, mild fever, headache, feeling tired, and nausea, vomiting, diarrhea, or  stomachache sometimes happen after Td vaccine. People sometimes faint after medical procedures, including vaccination. Tell your provider if you feel dizzy or have vision changes or ringing in the ears.  As with any medicine, there is a very remote chance of a vaccine causing a severe allergic reaction, other serious injury, or death. 5. What if there is a serious problem? An allergic reaction could occur after the vaccinated person leaves the clinic. If you see signs of a severe allergic reaction (hives, swelling of the face and throat, difficulty breathing, a fast heartbeat, dizziness, or weakness), call 9-1-1 and get the person to the nearest hospital.  For other signs that concern you, call your health care provider.  Adverse reactions should be reported to the Vaccine Adverse Event Reporting System (VAERS). Your health care provider will usually file this report, or you can do it yourself. Visit the VAERS website at www.vaers.SamedayNews.es or call (684) 764-9259. VAERS is only for reporting reactions, and VAERS staff do not give medical advice. 6. The National Vaccine Injury Compensation Program The [employer] Vaccine Injury Compensation Program (VICP) is a federal program that was created to compensate people who may have been injured by certain vaccines. Visit the VICP website at GoldCloset.com.ee or call 740-110-6790 to learn about the program and about filing a claim. There is a time limit to file a claim for compensation. 7. How can I learn more?  Ask your health care provider.  Call your local or state health department.  Contact the Centers for Disease Control and Prevention (CDC): ? Call 778-751-6803 (1-800-CDC-INFO) or ? Visit CDC's website at  http://hunter.com/ Vaccine Information Statement Td Vaccine (08/21/18) This information is not intended to replace advice given to you by your health care provider. Make sure you discuss any questions you have with your health care  provider. Document Revised: 09/30/2018 Document Reviewed: 09/02/2018 Elsevier Patient Education  [place].

## 2019-07-17 NOTE — Progress Notes (Signed)
3 MONTH FOLLOW UP Assessment / Plan   Diagnoses and all orders for this visit:   Essential hypertension Continue medications - reminded to take medications consistently Elevated today, but forgot to take medication this AM Monitor blood pressure at home; call if consistently over 130/80 Continue DASH diet.   Reminder to go to the ER if any CP, SOB, nausea, dizziness, severe HA, changes vision/speech, left arm numbness and tingling and jaw pain.  Coronary artery disease without angina pectoris, unspecified vessel or lesion type, unspecified whether native or transplanted heart Control blood pressure, cholesterol, glucose, increase exercise.  No longer follows with cardiology  had normal myoview in in 2013, he is very active with no angina or other concerns, medical risk management continued through this office, will refer back if needed  Seizure disorder ([place]) Well controlled  Taking carbamazepine BID Last seizure, unable to recall, years ago  Continue medication, check levels as needed  Hyperlipidemia, mixed Continue medications: Rosuvastatin 40mg  daily and fish oil 1,000mg  daily Discussed dietary and exercise modifications Low fat diet -     Lipid panel  Abnormal glucose Discussed dietary and exercise modifications  Vitamin D deficiency Continue supplementation Taking Vitamin D 10,000 IU daily  History of colonic polyps  BPH with obstruction/lower urinary tract symptoms  Elevated TSH In normal range las check, continue to monitor intermittently  Cut of hand, unspecified laterality, initial encounter Need for Tdap vaccination Cut by metal -     Tdap vaccine greater than or equal to 7yo IM Received today Discussed vaccination and side effetcs  Obesity (BMI 30.0-34.9) Long discussion about weight loss, diet, and exercise Recommended diet heavy in fruits and veggies and low in animal meats, cheeses, and dairy products, appropriate calorie intake Discussed appropriate  weight for height Follow up at next visit  Medication management Continued  Over 30 minutes of face to face interview, exam, counseling, chart review, and critical decision making was performed  Future Appointments  Date Time Provider [place]  07/22/2019 12:30 PM [place]  10/08/2019  9:00 AM [name], MD [facility] None  01/12/2020  9:00 AM Liane Comber, NP [facility] None      Subjective:  [name] is a 69 y.o. male who presents for  3 month follow up for HTN, CAD, HLD, prediabetes/abnormal glucose, and vitamin D Def.    Patient reports last week he cut his hand while working on his tractor.  His last Tdap vaccination was in 2010.   Skin is approximated, mild erythema.  Discussed S&S of infection.  Continue to monitor. Patient has hx/o atypical temporal lobe seizures (1997) manifest with a fugue state, but absent motor activity.  He is unable to recall last seizure but has been many years.  He is taking carbamazepine BID.    He has plantar facitis and following with [facility], Dr Berenice Primas.  Reports he had an injection about 1.5 years ago that helped on the left. Reports he is managing at this time.  He has information regarding stretches and icing.  BMI is Body mass index is 31.33 kg/m., he has been working on diet and exercise, walks 2 miles daily.  He stays busy with work and wood working in his shop. Wt Readings from Last 3 Encounters:  07/17/19 203 lb (92.1 kg)  02/27/19 200 lb 9.6 oz (91 kg)  11/07/18 206 lb (93.4 kg)   In 2008, patient underwent an emergent CABG and has done well since.  He  had an ETT-myoview in 5/13- normal study.  His blood pressure has been controlled at home (130s/70s), today their BP is BP: 130/72  He does workout. He denies chest pain, shortness of breath, dizziness.   He is on cholesterol medication (crestor 20 mg daily) and denies myalgias. His LDL cholesterol is not at goal; his  triglycerides remain elevated. The cholesterol last visit was:   Lab Results  Component Value Date   CHOL 159 02/27/2019   HDL 46 02/27/2019   LDLCALC 88 02/27/2019   TRIG 148 02/27/2019   CHOLHDL 3.5 02/27/2019   He has been working on diet and exercise for prediabetes, and denies foot ulcerations, increased appetite, nausea, paresthesia of the feet, polydipsia, polyuria, visual disturbances, vomiting and weight loss. Last A1C in the office was:  Lab Results  Component Value Date   HGBA1C 5.5 02/27/2019   Last GFR Lab Results  Component Value Date   GFRNONAA 74 02/27/2019    Patient is on Vitamin D supplement and at goal at recent check:    Lab Results  Component Value Date   VD25OH 66 02/27/2019      Medication Review: Current Outpatient Medications on File Prior to Visit  Medication Sig Dispense Refill  . aspirin 81 MG tablet Take 81 mg by mouth daily.      . carbamazepine (CARBATROL) 300 MG 12 hr capsule Take 1 tablet 2 x /day to prevent Seizures 180 capsule 3  . Cholecalciferol (VITAMIN D PO) Take 10,000 Units by mouth daily.      . fish oil-omega-3 fatty acids 1000 MG capsule Take 1 g by mouth daily.     . LUTEIN PO Take by mouth daily.    . Magnesium 250 MG TABS Take 250 mg by mouth daily.    . metoprolol succinate (TOPROL-XL) 100 MG 24 hr tablet Take 1 tablet Daily for BP 90 tablet 3  . rosuvastatin (CRESTOR) 40 MG tablet Take 1 tablet Daily for Cholesterol 90 tablet 3  . tadalafil (CIALIS) 20 MG tablet Take 1/2 to 1 tablet every 2 to 3 days as needed for XXXX 30 tablet 99   No current facility-administered medications on file prior to visit.    Allergies: Allergies  Allergen Reactions  . Lipitor [Atorvastatin]     myalgia  . Niacin And Related Other (See Comments)    Pt unaware of this intolerance (10/31/13)    Current Problems (verified) has Seizure disorder ([place]); Hyperlipidemia; Hypertension; CAD (coronary artery disease); Vitamin D deficiency; Medication  management; Obesity (BMI 30.0-34.9); Other abnormal glucose (prediabetes); Encounter for Medicare annual wellness exam; and History of colonic polyps on their problem list.  Screening Tests Immunization History  Administered Date(s) Administered  . Influenza, High Dose Seasonal PF 02/23/2015, 04/21/2017, 03/28/2018, 02/27/2019  . PFIZER SARS-COV-2 Vaccination 06/26/2019  . Pneumococcal Conjugate-13 12/17/2015  . Pneumococcal Polysaccharide-23 04/22/2009  . Td 04/22/2009  . Zoster 09/09/2009   Preventative care: Last colonoscopy: cologuard 07/2018 positive, had colonoscopy 09/11/2018, 3 polyps, follow up 08/2021 Echo 2008 CXR 2008 AB [place] 2005  Prior vaccinations: TD or Tdap: 2010, received today, 07/17/19 Influenza: 2019 Pneumococcal: 2010 Prevnar13: 2017 Shingles/Zostavax: 2011   Names of Other Physician/Practitioners you currently use: 1. [facility] here for primary care 2. Dr. Delman Cheadle, eye doctor, last visit 2019, Due 3. Dr. Olena Heckle, dentist, last visit 2019, Due  Patient Care Team: [name], MD as PCP - General (Internal Medicine) Larey Dresser, MD as Consulting Physician (Cardiology)  Surgical: He  has a past surgical history that includes Coronary artery bypass graft (08/2006); Cholecystectomy; and Hand surgery. Family His family history includes Diabetes in his father; Hypertension in his mother; Lupus in his sister; Stroke in his mother. Social history  He reports that he quit smoking about 51 years ago. His smoking use included cigarettes. He has never used smokeless tobacco. He reports that he does not drink alcohol or use drugs.    Objective:   Today's Vitals   07/17/19 0943  BP: 130/72  Pulse: 63  Temp: (!) 97 F (36.1 C)  SpO2: 97%  Weight: 203 lb (92.1 kg)   Body mass index is 31.33 kg/m.  General appearance: alert, no distress, WD/WN, male HEENT: normocephalic, sclerae anicteric, TMs pearly, nares  patent, no discharge or erythema, pharynx normal Oral cavity: MMM, no lesions Neck: supple, no lymphadenopathy, no thyromegaly, no masses Heart: RRR, normal S1, S2, no murmurs Lungs: CTA bilaterally, no wheezes, rhonchi, or rales Abdomen: +bs, soft, non tender, non distended, no masses, no hepatomegaly, no splenomegaly Musculoskeletal: nontender, no swelling, no obvious deformity Extremities: no edema, no cyanosis, no clubbing Pulses: 2+ symmetric, upper and lower extremities, normal cap refill Neurological: alert, oriented x 3, CN2-12 intact, strength normal upper extremities and lower extremities, sensation normal throughout, DTRs 2+ throughout, no cerebellar signs, gait normal Psychiatric: normal affect, behavior normal, pleasant  plan for preventive services.     Garnet Sierras, NP   07/17/2019

## 2019-07-18 LAB — CBC WITH DIFFERENTIAL/PLATELET
Absolute Monocytes: 382 cells/uL (ref 200–950)
Basophils Absolute: 22 cells/uL (ref 0–200)
Basophils Relative: 0.6 %
Eosinophils Absolute: 50 cells/uL (ref 15–500)
Eosinophils Relative: 1.4 %
HCT: 41.5 % (ref 38.5–50.0)
Hemoglobin: 14.6 g/dL (ref 13.2–17.1)
Lymphs Abs: 1231 cells/uL (ref 850–3900)
MCH: 32 pg (ref 27.0–33.0)
MCHC: 35.2 g/dL (ref 32.0–36.0)
MCV: 91 fL (ref 80.0–100.0)
MPV: 10.3 fL (ref 7.5–12.5)
Monocytes Relative: 10.6 %
Neutro Abs: 1915 cells/uL (ref 1500–7800)
Neutrophils Relative %: 53.2 %
Platelets: 147 10*3/uL (ref 140–400)
RBC: 4.56 10*6/uL (ref 4.20–5.80)
RDW: 12.4 % (ref 11.0–15.0)
Total Lymphocyte: 34.2 %
WBC: 3.6 10*3/uL — ABNORMAL LOW (ref 3.8–10.8)

## 2019-07-18 LAB — COMPLETE METABOLIC PANEL WITH GFR
AG Ratio: 1.8 (calc) (ref 1.0–2.5)
ALT: 18 U/L (ref 9–46)
AST: 24 U/L (ref 10–35)
Albumin: 4.4 g/dL (ref 3.6–5.1)
Alkaline phosphatase (APISO): 70 U/L (ref 35–144)
BUN: 22 mg/dL (ref 7–25)
CO2: 29 mmol/L (ref 20–32)
Calcium: 9.6 mg/dL (ref 8.6–10.3)
Chloride: 105 mmol/L (ref 98–110)
Creat: 0.94 mg/dL (ref 0.70–1.18)
GFR, Est African American: 94 mL/min/{1.73_m2} (ref >=60)
GFR, Est Non African American: 81 mL/min/{1.73_m2} (ref >=60)
Globulin: 2.5 g/dL (calc) (ref 1.9–3.7)
Glucose, Bld: 103 mg/dL — ABNORMAL HIGH (ref 65–99)
Potassium: 4.2 mmol/L (ref 3.5–5.3)
Sodium: 141 mmol/L (ref 135–146)
Total Bilirubin: 0.5 mg/dL (ref 0.2–1.2)
Total Protein: 6.9 g/dL (ref 6.1–8.1)

## 2019-07-18 LAB — LIPID PANEL
Cholesterol: 131 mg/dL (ref <200)
HDL: 47 mg/dL (ref >=40)
LDL Cholesterol (Calc): 65 mg/dL (calc)
Non-HDL Cholesterol (Calc): 84 mg/dL (calc) (ref <130)
Total CHOL/HDL Ratio: 2.8 (calc) (ref <5.0)
Triglycerides: 100 mg/dL (ref <150)

## 2019-07-18 LAB — MAGNESIUM: Magnesium: 2 mg/dL (ref 1.5–2.5)

## 2019-07-18 LAB — TSH: TSH: 3.04 mIU/L (ref 0.40–4.50)

## 2019-07-21 ENCOUNTER — Ambulatory Visit: Payer: Medicare Other

## 2019-07-22 ENCOUNTER — Ambulatory Visit: Payer: Medicare Other

## 2019-08-04 ENCOUNTER — Ambulatory Visit: Payer: Medicare Other | Attending: Internal Medicine

## 2019-08-04 DIAGNOSIS — Z23 Encounter for immunization: Secondary | ICD-10-CM

## 2019-08-04 NOTE — Progress Notes (Signed)
   Covid-19 Vaccination Clinic  Name:  [name]    MRN: [id number] DOB: [birth year]  08/04/2019  [name] was observed post Covid-19 immunization for 15 minutes without incident. He was provided with Vaccine Information Sheet and instruction to access the V-Safe system.   [name] was instructed to call 911 with any severe reactions post vaccine: Marland Kitchen Difficulty breathing  . Swelling of face and throat  . A fast heartbeat  . A bad rash all over body  . Dizziness and weakness   Immunizations Administered    Name Date Dose VIS Date Route   Pfizer COVID-19 Vaccine 08/04/2019 11:35 AM 0.3 mL 05/02/2019 Intramuscular   Manufacturer: [employer]   Lot: [id number]   [place]: [id number]

## 2019-08-13 ENCOUNTER — Encounter: Payer: Self-pay | Admitting: Adult Health

## 2019-08-13 ENCOUNTER — Ambulatory Visit (INDEPENDENT_AMBULATORY_CARE_PROVIDER_SITE_OTHER): Payer: Medicare Other | Admitting: Adult Health

## 2019-08-13 ENCOUNTER — Ambulatory Visit
Admission: RE | Admit: 2019-08-13 | Discharge: 2019-08-13 | Disposition: A | Discharge status: Home / Self Care | Payer: Medicare Other | Source: Ambulatory Visit | Attending: Adult Health | Admitting: Adult Health

## 2019-08-13 ENCOUNTER — Other Ambulatory Visit: Payer: Self-pay

## 2019-08-13 VITALS — BP 130/72 | HR 52 | Temp 97.7°F | Wt 204.6 lb

## 2019-08-13 DIAGNOSIS — I2510 Atherosclerotic heart disease of native coronary artery without angina pectoris: Secondary | ICD-10-CM | POA: Diagnosis not present

## 2019-08-13 DIAGNOSIS — R0789 Other chest pain: Secondary | ICD-10-CM

## 2019-08-13 DIAGNOSIS — I10 Essential (primary) hypertension: Secondary | ICD-10-CM | POA: Diagnosis not present

## 2019-08-13 DIAGNOSIS — Z87891 Personal history of nicotine dependence: Secondary | ICD-10-CM | POA: Diagnosis not present

## 2019-08-13 MED ORDER — NITROGLYCERIN 0.4 MG SL SUBL: 0.4000 mg | SUBLINGUAL_TABLET | SUBLINGUAL | 1 refills | Status: DC | PRN

## 2019-08-13 NOTE — Progress Notes (Addendum)
Assessment and Plan:  [name] was seen today for chest pain.  Diagnoses and all orders for this visit:  Intermittent left-sided chest pain Hx of CAD/CABG in 2008 lost to cardiology follow up though with ETT-myoview in 5/13 that was a normal study. Intermittent chest pain, exam most suggestive of musculoskeletal etiology, costochondritis or similar chest wall pain Atypical for cardic etiology, no concerning elements; EKG today with WNL and consistent with baseline; his risk factors are well controlled. Discussed cardiology referral for follow up considering his history and lack of follow up, which he ultimately denied.  Will pursue CXR as last remote and due to smoking history for completeness; based on history and exam low suspicion for pneumonia or lung etiology.  He denies needing any medication for pain or suspected chest wall pain; he will try ice, reducing offending activity Discussed and sent in nitroglycerine to try should he have chest pains that do not immediately resolve; discussed at length he SHOULD NOT TAKE THIS if has taken cialis within 48 hours due to risk of profound hypotension, present to ED if persistent The patient was advised to call immediately if he has any concerning symptoms in the interval. May call back for cardiology referral at any time.  The patient voices understanding of current treatment options and is in agreement with the current care plan.The patient knows to call the clinic with any problems, questions or concerns or go to the ER if any further progression of symptoms.  -     EKG 12-Lead -     DG Chest 2 View; Future  Other orders -     nitroGLYCERIN (NITROSTAT) 0.4 MG SL tablet; Place 1 tablet (0.4 mg total) under the tongue every 5 (five) minutes as needed for chest pain. Max 3 doses (1.2 mg)/day. Do not take within 24 hours of tadalafil.  Further disposition pending results of labs. Discussed med's effects and SE's.   Over 30 minutes of exam,  counseling, chart review, and critical decision making was performed.   Future Appointments  Date Time Provider [place]  08/13/2019  3:00 PM Liane Comber, NP [facility] None  10/30/2019 10:00 AM [name], MD [facility] None  02/02/2020 10:00 AM Liane Comber, NP [facility] None    ------------------------------------------------------------------------------------------------------------------   HPI 69 y.o.male with hx of htn, hyperlipidemia, obesity, CAD/ischemic heart disease s/p CABG in 2008, remote smoker quit in 1970, presents for evaluation of intermittent localized left sided chest pain.   He is unsure of when exactly this began several weeks ago, unsure of exact onset, "just in the past month has been noticing it." He reports pain is very localized, left side of chest, can point to specific area (points to approx 1 cm from sternum, L side, 4-5th rib area). He describes as episodes of brief sharp stabbing pain, ranges from 2-3 up to 7/10, quickly resolves spontaneously within a second or two. He feels this is superficial, some tenderness. Denies accompaniments, dyspnea, dizziness, fatigue, neck/shoulder/jaw/arm pain, edema, PND, cough, wheezing, chest tightness, cold sweats, nausea, upper abdominal pain. Denies fever/chills.   He denies any aggravating events, cannot correlate to movement, position, meals, heavy lifting, exertion. Hasn't tried anything to relieve pain, no alleviating factors.   He reports he works around the yard and does a lot of heavy lifting around his wood shop, extensive walking, hasn't correlated to exertion or heavy lifting.   Yesterday he was crawling under the house to install something, was supporting himself with his arms and was feeling this  more frequently than he had previously. Stopped having once he stopped crawling and got back up. Today he denies any symptoms.   He has been taking advil occasionally for other aches and pains,  recently doing more heat application which is helping. Last advil 4-5 days ago. Endorses some GERD type symptoms in recent years, has after meals, resolves quickly with OTC gelusil PRN, once q1-2 weeks. Denies abd pain, coughing, hoarseness, early satiety, emesis.   In 2008, patient underwent an emergent CABG and has done well since.  He had an ETT-myoview in 5/13 that was a normal study.  Lost to follow up with cardiology, had recently reported he is very active with no angina or other concerns, medical risk management was continued through this office. He apparently tried to call previous cardiologist but was advised needs referral due to extended period since last evaluation.  Last CXR in 2008.   His blood pressure has been controlled at home, today their BP is BP: 130/72  He does workout.    He is on cholesterol medication and denies myalgias. His cholesterol is at goal. The cholesterol last visit was:   Lab Results  Component Value Date   CHOL 131 07/17/2019   HDL 47 07/17/2019   LDLCALC 65 07/17/2019   TRIG 100 07/17/2019   CHOLHDL 2.8 07/17/2019   Last A1C in the office was:  Lab Results  Component Value Date   HGBA1C 5.5 02/27/2019   BMI is Body mass index is 31.57 kg/m., he has been working on diet and exercise. Wt Readings from Last 3 Encounters:  08/13/19 204 lb 9.6 oz (92.8 kg)  07/17/19 203 lb (92.1 kg)  02/27/19 200 lb 9.6 oz (91 kg)     Past Medical History:  Diagnosis Date  . Bilateral carpal tunnel syndrome   . Dyslipidemia   . Hyperlipidemia   . Ischemic heart disease   . Plantar fasciitis   . Seizure disorder ([place])   . Vitamin D deficiency      Allergies  Allergen Reactions  . Lipitor [Atorvastatin]     myalgia  . Niacin And Related Other (See Comments)    Pt unaware of this intolerance (10/31/13)    Current Outpatient Medications on File Prior to Visit  Medication Sig  . aspirin 81 MG tablet Take 81 mg by mouth daily.    . carbamazepine  (CARBATROL) 300 MG 12 hr capsule Take 1 tablet 2 x /day to prevent Seizures  . Cholecalciferol (VITAMIN D PO) Take 10,000 Units by mouth daily.    . fish oil-omega-3 fatty acids 1000 MG capsule Take 1 g by mouth daily.   . LUTEIN PO Take by mouth daily.  . Magnesium 250 MG TABS Take 250 mg by mouth daily.  . metoprolol succinate (TOPROL-XL) 100 MG 24 hr tablet Take 1 tablet Daily for BP  . rosuvastatin (CRESTOR) 40 MG tablet Take 1 tablet Daily for Cholesterol  . tadalafil (CIALIS) 20 MG tablet Take 1/2 to 1 tablet every 2 to 3 days as needed for XXXX   No current facility-administered medications on file prior to visit.    ROS: all negative except above.   Physical Exam:  BP 130/72   Pulse (!) 52   Temp 97.7 F (36.5 C)   Wt 204 lb 9.6 oz (92.8 kg)   SpO2 97%   BMI 31.57 kg/m   General Appearance: Well nourished, in no apparent distress. Eyes: PERRLA, EOMs, conjunctiva no swelling or erythema Sinuses: No Frontal/maxillary  tenderness ENT/Mouth: Ext aud canals clear, TMs without erythema, bulging. No erythema, swelling, or exudate on post pharynx.  Tonsils not swollen or erythematous. Hearing normal.  Neck: Supple, thyroid normal.  Respiratory: Respiratory effort normal, BS equal bilaterally without rales, rhonchi, wheezing or stridor.  Cardio: RRR with no MRGs. Brisk peripheral pulses without edema.  Abdomen: Soft, + BS.  Non tender, no guarding, rebound, hernias, masses. Lymphatics: Non tender without lymphadenopathy.  Musculoskeletal: Full ROM, 5/5 strength, normal gait. He has localized tenderness over left chest wall, ~2 inches from medial line over 4th-5th rib area without crepitus, lump, palpable bony abnormality Skin: Warm, dry without rashes, lesions, ecchymosis.  Neuro: Cranial nerves intact. Normal muscle tone, no cerebellar symptoms. Sensation intact.  Psych: Awake and oriented X 3, normal affect, Insight and Judgment appropriate.    EKG: sinus brady, IRBBB, no ST  changes or other significant since last EKG  Izora Ribas, NP 2:56 PM [facility]

## 2019-08-13 NOTE — Addendum Note (Signed)
Addended by: Izora Ribas on: 08/13/2019 05:42 PM   Modules accepted: Orders

## 2019-08-13 NOTE — Patient Instructions (Addendum)
[facility] for chest xray      Chest Wall Pain Chest wall pain is pain in or around the bones and muscles of your chest. Sometimes, an injury causes this pain. Excessive coughing or overuse of arm and chest muscles may also cause chest wall pain. Sometimes, the cause may not be known. This pain may take several weeks or longer to get better. Follow these instructions at home: Managing pain, stiffness, and swelling   If directed, put ice on the painful area: ? Put ice in a plastic bag. ? Place a towel between your skin and the bag. ? Leave the ice on for 20 minutes, 2-3 times per day. Activity  Rest as told by your health care provider.  Avoid activities that cause pain. These include any activities that use your chest muscles or your abdominal and side muscles to lift heavy items. Ask your health care provider what activities are safe for you. General instructions   Take over-the-counter and prescription medicines only as told by your health care provider.  Do not use any products that contain nicotine or tobacco, such as cigarettes, e-cigarettes, and chewing tobacco. These can delay healing after injury. If you need help quitting, ask your health care provider.  Keep all follow-up visits as told by your health care provider. This is important. Contact a health care provider if:  You have a fever.  Your chest pain becomes worse.  You have new symptoms. Get help right away if:  You have nausea or vomiting.  You feel sweaty or light-headed.  You have a cough with mucus from your lungs (sputum) or you cough up blood.  You develop shortness of breath. These symptoms may represent a serious problem that is an emergency. Do not wait to see if the symptoms will go away. Get medical help right away. Call your local emergency services (911 in the [place] Do not drive yourself to the hospital. Summary  Chest wall pain is pain in or around the bones and  muscles of your chest.  Depending on the cause, it may be treated with ice, rest, medicines, and avoiding activities that cause pain.  Contact a health care provider if you have a fever, worsening chest pain, or new symptoms.  Get help right away if you feel light-headed or you develop shortness of breath. These symptoms may be an emergency. This information is not intended to replace advice given to you by your health care provider. Make sure you discuss any questions you have with your health care provider. Document Revised: 11/08/2017 Document Reviewed: 11/08/2017 Elsevier Patient Education  [place].      Nitroglycerin sublingual tablets What is this medicine? NITROGLYCERIN (nye troe GLI ser in) is a type of vasodilator. It relaxes blood vessels, increasing the blood and oxygen supply to your heart. This medicine is used to relieve chest pain caused by angina. It is also used to prevent chest pain before activities like climbing stairs, going outdoors in cold weather, or sexual activity. This medicine may be used for other purposes; ask your health care provider or pharmacist if you have questions. COMMON BRAND NAME(S): Nitroquick, Nitrostat, Nitrotab What should I tell my health care provider before I take this medicine? They need to know if you have any of these conditions:  anemia  head injury, recent stroke, or bleeding in the brain  liver disease  previous heart attack  an unusual or allergic reaction to nitroglycerin, other medicines, foods,  dyes, or preservatives  pregnant or trying to get pregnant  breast-feeding How should I use this medicine? Take this medicine by mouth as needed. At the first sign of an angina attack (chest pain or tightness) place one tablet under your tongue. You can also take this medicine 5 to 10 minutes before an event likely to produce chest pain. Follow the directions on the prescription label. Let the tablet dissolve under the  tongue. Do not swallow whole. Replace the dose if you accidentally swallow it. It will help if your mouth is not dry. Saliva around the tablet will help it to dissolve more quickly. Do not eat or drink, smoke or chew tobacco while a tablet is dissolving. If you are not better within 5 minutes after taking ONE dose of nitroglycerin, call 9-1-1 immediately to seek emergency medical care. Do not take more than 3 nitroglycerin tablets over 15 minutes. If you take this medicine often to relieve symptoms of angina, your doctor or health care professional may provide you with different instructions to manage your symptoms. If symptoms do not go away after following these instructions, it is important to call 9-1-1 immediately. Do not take more than 3 nitroglycerin tablets over 15 minutes. Talk to your pediatrician regarding the use of this medicine in children. Special care may be needed. Overdosage: If you think you have taken too much of this medicine contact a poison control center or emergency room at once. NOTE: This medicine is only for you. Do not share this medicine with others. What if I miss a dose? This does not apply. This medicine is only used as needed. What may interact with this medicine? Do not take this medicine with any of the following medications:  certain migraine medicines like ergotamine and dihydroergotamine (DHE)  medicines used to treat erectile dysfunction like sildenafil, tadalafil, and vardenafil  riociguat This medicine may also interact with the following medications:  alteplase  aspirin  heparin  medicines for high blood pressure  medicines for mental depression  other medicines used to treat angina  phenothiazines like chlorpromazine, mesoridazine, prochlorperazine, thioridazine This list may not describe all possible interactions. Give your health care provider a list of all the medicines, herbs, non-prescription drugs, or dietary supplements you use. Also  tell them if you smoke, drink alcohol, or use illegal drugs. Some items may interact with your medicine. What should I watch for while using this medicine? Tell your doctor or health care professional if you feel your medicine is no longer working. Keep this medicine with you at all times. Sit or lie down when you take your medicine to prevent falling if you feel dizzy or faint after using it. Try to remain calm. This will help you to feel better faster. If you feel dizzy, take several deep breaths and lie down with your feet propped up, or bend forward with your head resting between your knees. You may get drowsy or dizzy. Do not drive, use machinery, or do anything that needs mental alertness until you know how this drug affects you. Do not stand or sit up quickly, especially if you are an older patient. This reduces the risk of dizzy or fainting spells. Alcohol can make you more drowsy and dizzy. Avoid alcoholic drinks. Do not treat yourself for coughs, colds, or pain while you are taking this medicine without asking your doctor or health care professional for advice. Some ingredients may increase your blood pressure. What side effects may I notice from receiving this  medicine? Side effects that you should report to your doctor or health care professional as soon as possible:  blurred vision  dry mouth  skin rash  sweating  the feeling of extreme pressure in the head  unusually weak or tired Side effects that usually do not require medical attention (report to your doctor or health care professional if they continue or are bothersome):  flushing of the face or neck  headache  irregular heartbeat, palpitations  nausea, vomiting This list may not describe all possible side effects. Call your doctor for medical advice about side effects. You may report side effects to FDA at 1-800-FDA-1088. Where should I keep my medicine? Keep out of the reach of children. Store at room temperature  between 20 and 25 degrees C (68 and 77 degrees F). Store in [facility] [employer]. Protect from light and moisture. Keep tightly closed. Throw away any unused medicine after the expiration date. NOTE: This sheet is a summary. It may not cover all possible information. If you have questions about this medicine, talk to your doctor, pharmacist, or health care provider.  2020 Elsevier/Gold Standard (2013-03-06 17:57:36)

## 2019-08-21 ENCOUNTER — Encounter: Payer: Self-pay | Admitting: Internal Medicine

## 2019-10-08 ENCOUNTER — Encounter: Payer: Self-pay | Admitting: Internal Medicine

## 2019-10-29 ENCOUNTER — Encounter: Payer: Self-pay | Admitting: Internal Medicine

## 2019-10-29 NOTE — Patient Instructions (Signed)

## 2019-10-29 NOTE — Progress Notes (Signed)
Annual  Screening/Preventative Visit  & Comprehensive Evaluation & Examination     This very nice 69 y.o.  MWM presents for a Screening /Preventative Visit & comprehensive evaluation and management of multiple medical co-morbidities.  Patient has been followed for HTN, ASCAD/CABG, HLD, hx/o Seizures, Prediabetes and Vitamin D Deficiency. Patient has remote hx/o Seizures (1987) manifest as "Absence" & apparently controlled on Keppra.     HTN predates since 2004. Patient's BP has been controlled at home.  Today's BP was initially slightly elevated & rechecked at goal - 136/78. In 2008, patient underwent emergent CABG. Patient denies any cardiac symptoms as chest pain, palpitations, shortness of breath, dizziness or ankle swelling.     Patient's hyperlipidemia is controlled with diet and medications. Patient denies myalgias or other medication SE's. Last lipids were at goal:  Lab Results  Component Value Date   CHOL 189 10/30/2019   HDL 42 10/30/2019   LDLCALC 109 (H) 10/30/2019   TRIG 268 (H) 10/30/2019   CHOLHDL 4.5 10/30/2019       Patient has hx/o prediabetes ("A1c 5.7% / 2014) and patient denies reactive hypoglycemic symptoms, visual blurring, diabetic polys or paresthesias. Last A1c was Normal & at goal:  Lab Results  Component Value Date   HGBA1C 5.4 10/30/2019        Finally, patient has history of Vitamin D Deficiency("24" / 2008)  and last vitamin D was at goal:  Lab Results  Component Value Date   VD25OH 66 10/30/2019    Current Outpatient Medications on File Prior to Visit  Medication Sig   aspirin 81 MG tablet Take 81 mg by mouth daily.     Cholecalciferol (VITAMIN D PO) Take 10,000 Units by mouth daily.     fish oil-omega-3 fatty acids 1000 MG capsule Take 1 g by mouth daily.    LUTEIN PO Take by mouth daily.   Magnesium 250 MG TABS Take 250 mg by mouth daily.   metoprolol succinate (TOPROL-XL) 100 MG 24 hr tablet Take 1 tablet Daily for BP    nitroGLYCERIN (NITROSTAT) 0.4 MG SL tablet Place 1 tablet (0.4 mg total) under the tongue every 5 (five) minutes as needed for chest pain. Max 3 doses (1.2 mg)/day. Do not take within 48 hours of tadalafil.   rosuvastatin (CRESTOR) 40 MG tablet Take 1 tablet Daily for Cholesterol   tadalafil (CIALIS) 20 MG tablet Take 1/2 to 1 tablet every 2 to 3 days as needed for XXXX   No current facility-administered medications on file prior to visit.   Allergies  Allergen Reactions   Lipitor [Atorvastatin]     myalgia   Niacin And Related Other (See Comments)    Pt unaware of this intolerance (10/31/13)   Past Medical History:  Diagnosis Date   Bilateral carpal tunnel syndrome    Dyslipidemia    Hyperlipidemia    Ischemic heart disease    Plantar fasciitis    Seizure disorder ([place])    Vitamin D deficiency    Health Maintenance  Topic Date Due   INFLUENZA VACCINE  12/21/2019   COLONOSCOPY  09/10/2021   TETANUS/TDAP  07/16/2029   COVID-19 Vaccine  Completed   Hepatitis C Screening  Completed   PNA vac Low Risk Adult  Discontinued   Immunization History  Administered Date(s) Administered   Influenza, High Dose Seasonal PF 02/23/2015, 04/21/2017, 05/03/2017, 03/28/2018, 02/27/2019   PFIZER SARS-COV-2 Vaccination 06/26/2019, 08/04/2019   Pneumococcal Conjugate-13 12/17/2015   Pneumococcal Polysaccharide-23 04/22/2009   Td  04/22/2009   Tdap 07/17/2019   Zoster 09/09/2009   Last Colon - 09/11/2018 - Dr Benson [place] - recc f/u Colon -   Past Surgical History:  Procedure Laterality Date   CHOLECYSTECTOMY     CORONARY ARTERY BYPASS GRAFT  08/2006   HAND SURGERY     Family History  Problem Relation Age of Onset   Hypertension Mother    Stroke Mother    Diabetes Father    Lupus Sister    Social History   Socioeconomic History   Marital status: Married    Spouse name: Not on file   Number of children: 2   Years of education: Not on file   Highest  education level: Not on file  Occupational History   Occupation: Licensed Optician  Tobacco Use   Smoking status: Former Smoker    Types: Cigarettes    Quit date: 05/22/1968    Years since quitting: 51.4   Smokeless tobacco: Never Used  Substance and Sexual Activity   Alcohol use: No    Alcohol/week: 0.0 standard drinks    Comment: very rarely   Drug use: No   Sexual activity: Not on file    ROS Constitutional: Denies fever, chills, weight loss/gain, headaches, insomnia,  night sweats or change in appetite. Does c/o fatigue. Eyes: Denies redness, blurred vision, diplopia, discharge, itchy or watery eyes.  ENT: Denies discharge, congestion, post nasal drip, epistaxis, sore throat, earache, hearing loss, dental pain, Tinnitus, Vertigo, Sinus pain or snoring.  Cardio: Denies chest pain, palpitations, irregular heartbeat, syncope, dyspnea, diaphoresis, orthopnea, PND, claudication or edema Respiratory: denies cough, dyspnea, DOE, pleurisy, hoarseness, laryngitis or wheezing.  Gastrointestinal: Denies dysphagia, heartburn, reflux, water brash, pain, cramps, nausea, vomiting, bloating, diarrhea, constipation, hematemesis, melena, hematochezia, jaundice or hemorrhoids Genitourinary: Denies dysuria, frequency, urgency, nocturia, hesitancy, discharge, hematuria or flank pain Musculoskeletal: Denies arthralgia, myalgia, stiffness, Jt. Swelling, pain, limp or strain/sprain. Denies Falls. Skin: Denies puritis, rash, hives, warts, acne, eczema or change in skin lesion Neuro: No weakness, tremor, incoordination, spasms, paresthesia or pain Psychiatric: Denies confusion, memory loss or sensory loss. Denies Depression. Endocrine: Denies change in weight, skin, hair change, nocturia, and paresthesia, diabetic polys, visual blurring or hyper / hypo glycemic episodes.  Heme/Lymph: No excessive bleeding, bruising or enlarged lymph nodes.  Physical Exam  BP 136/78    Pulse (!) 48    Temp (!) 97.2 F  (36.2 C)    Resp 16    Ht 5' 7.25" (1.708 m)    Wt 207 lb (93.9 kg)    BMI 32.18 kg/m   General Appearance: Well nourished and well groomed and in no apparent distress.  Eyes: PERRLA, EOMs, conjunctiva no swelling or erythema, normal fundi and vessels. Sinuses: No frontal/maxillary tenderness ENT/Mouth: EACs patent / TMs  nl. Nares clear without erythema, swelling, mucoid exudates. Oral hygiene is good. No erythema, swelling, or exudate. Tongue normal, non-obstructing. Tonsils not swollen or erythematous. Hearing normal.  Neck: Supple, thyroid not palpable. No bruits, nodes or JVD. Respiratory: Respiratory effort normal.  BS equal and clear bilateral without rales, rhonci, wheezing or stridor. Cardio: Heart sounds are normal with regular rate and rhythm and no murmurs, rubs or gallops. Peripheral pulses are normal and equal bilaterally without edema. No aortic or femoral bruits. Chest: symmetric with normal excursions and percussion.  Abdomen: Soft, with Nl bowel sounds. Nontender, no guarding, rebound, hernias, masses, or organomegaly.  Lymphatics: Non tender without lymphadenopathy.  Musculoskeletal: Full ROM all peripheral extremities, joint stability, 5/5 strength,  and normal gait. Skin: Warm and dry without rashes, lesions, cyanosis, clubbing or  ecchymosis.  Neuro: Cranial nerves intact, reflexes equal bilaterally. Normal muscle tone, no cerebellar symptoms. Sensation intact.  Pysch: Alert and oriented X 3 with normal affect, insight and judgment appropriate.   Assessment and Plan  1. Annual Preventative/Screening Exam   2. Essential hypertension  - EKG 12-Lead - [place], RETROPERITNL ABD,  LTD - Urinalysis, Routine w reflex microscopic - Microalbumin / creatinine urine ratio - CBC with Differential/Platelet - COMPLETE METABOLIC PANEL WITH GFR - Magnesium - TSH  3. Hyperlipidemia, mixed  - EKG 12-Lead - [place], RETROPERITNL ABD,  LTD - Lipid panel - TSH  4. Abnormal  glucose  - EKG 12-Lead - [place], RETROPERITNL ABD,  LTD - Hemoglobin A1c - Insulin, random  5. Vitamin D deficiency  - VITAMIN D 25 Hydroxy  6. Coronary artery disease involving coronary bypass  graft of native heart without angina pectoris  - EKG 12-Lead  7. BPH with obstruction/lower urinary tract symptoms  - PSA  8. Prostate cancer screening  - PSA  9. Screening for colorectal cancer  - POC Hemoccult Bld/Stl   10. Hypothyroidism  - TSH  11. Seizure disorder (H[facility])  - Levetiracetam level  12. Screening for ischemic heart disease  - EKG 12-Lead  13. FH: hypertension  - EKG 12-Lead - [place], RETROPERITNL ABD,  LTD  14. Former smoker  - EKG 12-Lead - [place], RETROPERITNL ABD,  LTD  15. Screening for AAA (aortic abdominal aneurysm)  - [place], RETROPERITNL ABD,  LTD  16. Medication management  - Urinalysis, Routine w reflex microscopic - Microalbumin / creatinine urine ratio - CBC with Differential/Platelet - COMPLETE METABOLIC PANEL WITH GFR - Magnesium - Lipid panel - TSH - Hemoglobin A1c - Insulin, random - VITAMIN D 25 Hydroxy - Levetiracetam level        Patient was counseled in prudent diet, weight control to achieve/maintain BMI less than 25, BP monitoring, regular exercise and medications as discussed.  Discussed med effects and SE's. Routine screening labs and tests as requested with regular follow-up as recommended. Over 40 minutes of exam, counseling, chart review and high complex critical decision making was performed   Kirtland Bouchard, MD

## 2019-10-30 ENCOUNTER — Ambulatory Visit (INDEPENDENT_AMBULATORY_CARE_PROVIDER_SITE_OTHER): Payer: Medicare Other | Admitting: Internal Medicine

## 2019-10-30 ENCOUNTER — Other Ambulatory Visit: Payer: Self-pay

## 2019-10-30 ENCOUNTER — Other Ambulatory Visit: Payer: Self-pay | Admitting: Internal Medicine

## 2019-10-30 VITALS — BP 136/78 | HR 48 | Temp 97.2°F | Resp 16 | Ht 67.25 in | Wt 207.0 lb

## 2019-10-30 DIAGNOSIS — Z0000 Encounter for general adult medical examination without abnormal findings: Secondary | ICD-10-CM

## 2019-10-30 DIAGNOSIS — I10 Essential (primary) hypertension: Secondary | ICD-10-CM

## 2019-10-30 DIAGNOSIS — E559 Vitamin D deficiency, unspecified: Secondary | ICD-10-CM | POA: Diagnosis not present

## 2019-10-30 DIAGNOSIS — I25810 Atherosclerosis of coronary artery bypass graft(s) without angina pectoris: Secondary | ICD-10-CM

## 2019-10-30 DIAGNOSIS — Z1211 Encounter for screening for malignant neoplasm of colon: Secondary | ICD-10-CM

## 2019-10-30 DIAGNOSIS — Z0001 Encounter for general adult medical examination with abnormal findings: Secondary | ICD-10-CM

## 2019-10-30 DIAGNOSIS — R7309 Other abnormal glucose: Secondary | ICD-10-CM

## 2019-10-30 DIAGNOSIS — Z87891 Personal history of nicotine dependence: Secondary | ICD-10-CM | POA: Diagnosis not present

## 2019-10-30 DIAGNOSIS — E782 Mixed hyperlipidemia: Secondary | ICD-10-CM

## 2019-10-30 DIAGNOSIS — E039 Hypothyroidism, unspecified: Secondary | ICD-10-CM | POA: Diagnosis not present

## 2019-10-30 DIAGNOSIS — Z136 Encounter for screening for cardiovascular disorders: Secondary | ICD-10-CM

## 2019-10-30 DIAGNOSIS — Z125 Encounter for screening for malignant neoplasm of prostate: Secondary | ICD-10-CM

## 2019-10-30 DIAGNOSIS — Z8249 Family history of ischemic heart disease and other diseases of the circulatory system: Secondary | ICD-10-CM

## 2019-10-30 DIAGNOSIS — G40909 Epilepsy, unspecified, not intractable, without status epilepticus: Secondary | ICD-10-CM

## 2019-10-30 DIAGNOSIS — Z79899 Other long term (current) drug therapy: Secondary | ICD-10-CM

## 2019-10-30 DIAGNOSIS — N138 Other obstructive and reflux uropathy: Secondary | ICD-10-CM

## 2019-10-31 ENCOUNTER — Encounter: Payer: Self-pay | Admitting: Internal Medicine

## 2019-11-03 ENCOUNTER — Other Ambulatory Visit: Payer: Self-pay | Admitting: Internal Medicine

## 2019-11-03 DIAGNOSIS — Z79899 Other long term (current) drug therapy: Secondary | ICD-10-CM

## 2019-11-03 DIAGNOSIS — G40909 Epilepsy, unspecified, not intractable, without status epilepticus: Secondary | ICD-10-CM

## 2019-11-03 LAB — CBC WITH DIFFERENTIAL/PLATELET
Absolute Monocytes: 284 cells/uL (ref 200–950)
Basophils Absolute: 30 cells/uL (ref 0–200)
Basophils Relative: 0.9 %
Eosinophils Absolute: 50 cells/uL (ref 15–500)
Eosinophils Relative: 1.5 %
HCT: 42 % (ref 38.5–50.0)
Hemoglobin: 14.4 g/dL (ref 13.2–17.1)
Lymphs Abs: 1043 cells/uL (ref 850–3900)
MCH: 32 pg (ref 27.0–33.0)
MCHC: 34.3 g/dL (ref 32.0–36.0)
MCV: 93.3 fL (ref 80.0–100.0)
MPV: 10.3 fL (ref 7.5–12.5)
Monocytes Relative: 8.6 %
Neutro Abs: 1894 cells/uL (ref 1500–7800)
Neutrophils Relative %: 57.4 %
Platelets: 144 10*3/uL (ref 140–400)
RBC: 4.5 10*6/uL (ref 4.20–5.80)
RDW: 12.1 % (ref 11.0–15.0)
Total Lymphocyte: 31.6 %
WBC: 3.3 10*3/uL — ABNORMAL LOW (ref 3.8–10.8)

## 2019-11-03 LAB — COMPLETE METABOLIC PANEL WITH GFR
AG Ratio: 1.9 (calc) (ref 1.0–2.5)
ALT: 19 U/L (ref 9–46)
AST: 19 U/L (ref 10–35)
Albumin: 4.1 g/dL (ref 3.6–5.1)
Alkaline phosphatase (APISO): 65 U/L (ref 35–144)
BUN: 15 mg/dL (ref 7–25)
CO2: 30 mmol/L (ref 20–32)
Calcium: 9.1 mg/dL (ref 8.6–10.3)
Chloride: 103 mmol/L (ref 98–110)
Creat: 0.92 mg/dL (ref 0.70–1.18)
GFR, Est African American: 97 mL/min/{1.73_m2} (ref >=60)
GFR, Est Non African American: 83 mL/min/{1.73_m2} (ref >=60)
Globulin: 2.2 g/dL (calc) (ref 1.9–3.7)
Glucose, Bld: 112 mg/dL — ABNORMAL HIGH (ref 65–99)
Potassium: 4.7 mmol/L (ref 3.5–5.3)
Sodium: 140 mmol/L (ref 135–146)
Total Bilirubin: 0.3 mg/dL (ref 0.2–1.2)
Total Protein: 6.3 g/dL (ref 6.1–8.1)

## 2019-11-03 LAB — INSULIN, RANDOM: Insulin: 12.7 u[IU]/mL

## 2019-11-03 LAB — URINALYSIS, ROUTINE W REFLEX MICROSCOPIC
Bilirubin Urine: NEGATIVE
Glucose, UA: NEGATIVE
Hgb urine dipstick: NEGATIVE
Ketones, ur: NEGATIVE
Leukocytes,Ua: NEGATIVE
Nitrite: NEGATIVE
Protein, ur: NEGATIVE
Specific Gravity, Urine: 1.016 (ref 1.001–1.03)
pH: 7 (ref 5.0–8.0)

## 2019-11-03 LAB — LEVETIRACETAM LEVEL: Keppra (Levetiracetam): <1 ug/mL — ABNORMAL LOW (ref 12.0–46.0)

## 2019-11-03 LAB — HEMOGLOBIN A1C
Hgb A1c MFr Bld: 5.4 % of total Hgb (ref <5.7)
Mean Plasma Glucose: 108 (calc)
eAG (mmol/L): 6 (calc)

## 2019-11-03 LAB — MICROALBUMIN / CREATININE URINE RATIO
Creatinine, Urine: 95 mg/dL (ref 20–320)
Microalb, Ur: <0.2 mg/dL

## 2019-11-03 LAB — LIPID PANEL
Cholesterol: 189 mg/dL (ref <200)
HDL: 42 mg/dL (ref >=40)
LDL Cholesterol (Calc): 109 mg/dL (calc) — ABNORMAL HIGH
Non-HDL Cholesterol (Calc): 147 mg/dL (calc) — ABNORMAL HIGH (ref <130)
Total CHOL/HDL Ratio: 4.5 (calc) (ref <5.0)
Triglycerides: 268 mg/dL — ABNORMAL HIGH (ref <150)

## 2019-11-03 LAB — VITAMIN D 25 HYDROXY (VIT D DEFICIENCY, FRACTURES): Vit D, 25-Hydroxy: 66 ng/mL (ref 30–100)

## 2019-11-03 LAB — TSH: TSH: 3.71 mIU/L (ref 0.40–4.50)

## 2019-11-03 LAB — MAGNESIUM: Magnesium: 1.9 mg/dL (ref 1.5–2.5)

## 2019-11-03 LAB — PSA: PSA: 0.3 ng/mL (ref <=4.0)

## 2019-11-20 ENCOUNTER — Ambulatory Visit: Payer: Medicare Other | Admitting: Adult Health

## 2019-12-31 ENCOUNTER — Other Ambulatory Visit: Payer: Self-pay | Admitting: Internal Medicine

## 2019-12-31 DIAGNOSIS — G40909 Epilepsy, unspecified, not intractable, without status epilepticus: Secondary | ICD-10-CM

## 2020-01-12 ENCOUNTER — Ambulatory Visit: Payer: Medicare Other | Admitting: Adult Health

## 2020-01-29 NOTE — Progress Notes (Signed)
MEDICARE ANNUAL WELLNESS VISIT AND FOLLOW UP Assessment:   Diagnoses and all orders for this visit:  Encounter for Medicare annual wellness exam  Essential hypertension Continue medications - reminded to take medications consistently Elevated today, but forgot to take medication this AM Monitor blood pressure at home; call if consistently over 130/80 Continue DASH diet.   Reminder to go to the ER if any CP, SOB, nausea, dizziness, severe HA, changes vision/speech, left arm numbness and tingling and jaw pain.  Coronary artery disease without angina pectoris, unspecified vessel or lesion type, unspecified whether native or transplanted heart Control blood pressure, cholesterol, glucose, increase exercise.  Lost to follow up with cardiology, had normal myoview in in 2013, he is very active with no angina or other concerns, medical risk management continued through this office, will refer back if needed  Seizure disorder ([place]) Well controlled by carbamazepine with last seizure remote Continue medication, check levels as needed  Vitamin D deficiency At goal at recent check; continue to recommend supplementation for goal of 70-100 Defer vitamin D level  Hx of prediabetes Recent A1Cs at goal Discussed diet/exercise, weight management  Defer A1C; check CMP  Obesity (BMI 30.0-34.9) Long discussion about weight loss, diet, and exercise Recommended diet heavy in fruits and veggies and low in animal meats, cheeses, and dairy products, appropriate calorie intake Discussed appropriate weight for height Follow up at next visit  Medication management Check CBC, CMP/GFR  Mixed hyperlipidemia Continue medications: crestor Continue low cholesterol diet and exercise.  Check lipid panel.   History of colon polyps Follow up colonoscopy due in 2023-2024 with Dr. Benson Norway   Over 30 minutes of exam, counseling, chart review, and critical decision making was performed  Future Appointments  Date  Time Provider [place]  05/04/2020  9:30 AM [name], MD [facility] None  11/01/2020 10:00 AM [name], MD [facility] None     Plan:   During the course of the visit the patient was educated and counseled about appropriate screening and preventive services including:    Pneumococcal vaccine   Influenza vaccine  Prevnar 13  Td vaccine  Screening electrocardiogram  Colorectal cancer screening  Diabetes screening  Glaucoma screening  Nutrition counseling    Subjective:  [name] is a 69 y.o. male who presents for Medicare Annual Wellness Visit and 3 month follow up for HTN, hyperlipidemia, prediabetes, and vitamin D Def.    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   He is seeing Dr. Berenice Primas as needed, shoulders improved after steroid injection, no surgery needed.   BMI is Body mass index is 31.95 kg/m., he has been working on diet and exercise, no formal exercise but has property and shops and very active throughout the day, minimal sitting.  Wt Readings from Last 3 Encounters:  02/02/20 204 lb (92.5 kg)  10/30/19 207 lb (93.9 kg)  08/13/19 204 lb 9.6 oz (92.8 kg)   In 2008, patient underwent an emergent CABG and has done well since.  He had an ETT-myoview in 5/13 that was a normal study.  Est with Dr. Aundra Dubin, has not followed up with cardiology since 2016, denies concerning sx, declined referral back  His blood pressure has been controlled at home (130s/70s), today their BP is BP: 120/84  He does workout. He denies chest pain, shortness of breath, dizziness.   He is on  cholesterol medication (crestor 20 mg daily) and denies myalgias. His LDL cholesterol is not at goal. The cholesterol last visit was:   Lab Results  Component Value Date   CHOL 189 10/30/2019   HDL 42 10/30/2019   LDLCALC 109 (H)  10/30/2019   TRIG 268 (H) 10/30/2019   CHOLHDL 4.5 10/30/2019    He has been working on diet and exercise for hx of prediabetes (6.1% in 2016, 5.7% in 07/2018, predates 2017), and denies foot ulcerations, increased appetite, nausea, paresthesia of the feet, polydipsia, polyuria, visual disturbances, vomiting and weight loss. Last A1C in the office was:  Lab Results  Component Value Date   HGBA1C 5.4 10/30/2019   Last GFR Lab Results  Component Value Date   GFRNONAA 83 10/30/2019    Patient is on Vitamin D supplement and at goal at recent check:    Lab Results  Component Value Date   VD25OH 66 10/30/2019      Medication Review: Current Outpatient Medications on File Prior to Visit  Medication Sig Dispense Refill  . aspirin 81 MG tablet Take 81 mg by mouth daily.      . carbamazepine (CARBATROL) 300 MG 12 hr capsule TAKE 1 CAPSULE BY MOUTH  TWICE DAILY TO PREVENT  SEIZURES 180 capsule 3  . Cholecalciferol (VITAMIN D PO) Take 10,000 Units by mouth daily.      . fish oil-omega-3 fatty acids 1000 MG capsule Take 1 g by mouth daily.     . LUTEIN PO Take by mouth daily.    . Magnesium 250 MG TABS Take 250 mg by mouth daily.    . metoprolol succinate (TOPROL-XL) 100 MG 24 hr tablet Take 1 tablet Daily for BP 90 tablet 3  . rosuvastatin (CRESTOR) 40 MG tablet Take 1 tablet Daily for Cholesterol 90 tablet 3  . tadalafil (CIALIS) 20 MG tablet Take 1/2 to 1 tablet every 2 to 3 days as needed for XXXX 30 tablet 99   No current facility-administered medications on file prior to visit.    Allergies: Allergies  Allergen Reactions  . Lipitor [Atorvastatin]     myalgia  . Niacin And Related Other (See Comments)    Pt unaware of this intolerance (10/31/13)    Current Problems (verified) has Seizure disorder ([place]); Hyperlipidemia; Hypertension; CAD (coronary artery disease); Vitamin D deficiency; Medication management; Obesity (BMI 30.0-34.9); Other abnormal glucose (prediabetes); Encounter  for Medicare annual wellness exam; and History of colonic polyps on their problem list.  Screening Tests Immunization History  Administered Date(s) Administered  . Influenza, High Dose Seasonal PF 02/23/2015, 04/21/2017, 05/03/2017, 03/28/2018, 02/27/2019  . PFIZER SARS-COV-2 Vaccination 06/26/2019, 08/04/2019  . Pneumococcal Conjugate-13 12/17/2015  . Pneumococcal Polysaccharide-23 04/22/2009  . Td 04/22/2009  . Tdap 07/17/2019  . Zoster 09/09/2009   Preventative care: Last colonoscopy: cologuard 07/2018 positive, had colonoscopy 09/11/2018, 3 polyps, follow up 08/2021 Echo 2008 CXR 2008 AB [place] 2005 Stress myoview 2013  Prior vaccinations: TD or Tdap: 06/2019  Influenza: 2020 Pneumococcal: 2010 Prevnar13: 2017 Shingles/Zostavax: 2011 Covid 19: 2/2, 2021, pfizer  Names of Other Physician/Practitioners you currently use: 1. [facility] here for primary care 2. Dr. Delman Cheadle, eye doctor, last visit 2020 3. Dr. Luretha Rued, dentist, last visit 2021  Patient Care Team: [name], MD as PCP - General (Internal Medicine) Larey Dresser, MD as Consulting Physician (Cardiology)  Surgical: He  has a past surgical history that includes Coronary artery bypass graft (08/2006); Cholecystectomy; and Hand surgery.  Family His family history includes Diabetes in his father; Hypertension in his mother; Lupus in his sister; Stroke in his mother. Social history  He reports that he quit smoking about 51 years ago. His smoking use included cigarettes. He has never used smokeless tobacco. He reports that he does not drink alcohol and does not use drugs.  MEDICARE WELLNESS OBJECTIVES: Physical activity: Exercise limited by: None identified Cardiac risk factors: Cardiac Risk Factors include: advanced age (>79men, >17 women);dyslipidemia;hypertension;male gender;obesity (BMI >30kg/m2);smoking/ tobacco exposure Depression/mood screen:   Depression screen [facility] 2/9  02/02/2020  Decreased Interest 0  Down, Depressed, Hopeless 0  PHQ - 2 Score 0    ADLs:  In your present state of health, do you have any difficulty performing the following activities: 02/02/2020 10/29/2019  Hearing? N N  Vision? N N  Difficulty concentrating or making decisions? N N  Walking or climbing stairs? N N  Dressing or bathing? N N  Doing errands, shopping? N N  Some recent data might be hidden     Cognitive Testing  Alert? Yes  Normal Appearance?Yes  Oriented to person? Yes  Place? Yes   Time? Yes  Recall of three objects?  Yes  Can perform simple calculations? Yes  Displays appropriate judgment?Yes  Can read the correct time from a watch face?Yes  EOL planning: Does Patient Have a Medical Advance Directive?: Yes Type of Advance Directive: Healthcare Power of Attorney, Living will Does patient want to make changes to medical advance directive?: No - Patient declined Copy of [place] in Chart?: No - copy requested   Objective:   Today's Vitals   02/02/20 1006  BP: 120/84  Pulse: (!) 56  Temp: (!) 97.3 F (36.3 C)  SpO2: 98%  Weight: 204 lb (92.5 kg)  Height: 5\' 7"  (1.702 m)  PainSc: 0-No pain   Body mass index is 31.95 kg/m.  General appearance: alert, no distress, WD/WN, male HEENT: normocephalic, sclerae anicteric, TMs pearly, nares patent, no discharge or erythema, pharynx normal Oral cavity: MMM, no lesions Neck: supple, no lymphadenopathy, no thyromegaly, no masses Heart: RRR, normal S1, S2, no murmurs Lungs: CTA bilaterally, no wheezes, rhonchi, or rales Abdomen: +bs, soft, non tender, non distended, no masses, no hepatomegaly, no splenomegaly Musculoskeletal: nontender, no swelling, no obvious deformity Extremities: no edema, no cyanosis, no clubbing Pulses: 2+ symmetric, upper and lower extremities, normal cap refill Neurological: alert, oriented x 3, CN2-12 intact, strength normal upper extremities and lower extremities,  sensation normal throughout, DTRs 2+ throughout, no cerebellar signs, gait normal Psychiatric: normal affect, behavior normal, pleasant   Medicare Attestation I have personally reviewed: The patient's medical and social history Their use of alcohol, tobacco or illicit drugs Their current medications and supplements The patient's functional ability including ADLs,fall risks, home safety risks, cognitive, and hearing and visual impairment Diet and physical activities Evidence for depression or mood disorders  The patient's weight, height, BMI, and visual acuity have been recorded in the chart.  I have made referrals, counseling, and provided education to the patient based on review of the above and I have provided the patient with a written personalized care plan for preventive services.     Izora Ribas, NP   02/02/2020

## 2020-02-02 ENCOUNTER — Ambulatory Visit (INDEPENDENT_AMBULATORY_CARE_PROVIDER_SITE_OTHER): Payer: Medicare Other | Admitting: Adult Health

## 2020-02-02 ENCOUNTER — Encounter: Payer: Self-pay | Admitting: Adult Health

## 2020-02-02 ENCOUNTER — Other Ambulatory Visit: Payer: Self-pay

## 2020-02-02 VITALS — BP 120/84 | HR 56 | Temp 97.3°F | Ht 67.0 in | Wt 204.0 lb

## 2020-02-02 DIAGNOSIS — I2510 Atherosclerotic heart disease of native coronary artery without angina pectoris: Secondary | ICD-10-CM

## 2020-02-02 DIAGNOSIS — Z860100 Personal history of colon polyps, unspecified: Secondary | ICD-10-CM

## 2020-02-02 DIAGNOSIS — Z0000 Encounter for general adult medical examination without abnormal findings: Secondary | ICD-10-CM

## 2020-02-02 DIAGNOSIS — G40909 Epilepsy, unspecified, not intractable, without status epilepticus: Secondary | ICD-10-CM | POA: Diagnosis not present

## 2020-02-02 DIAGNOSIS — D72819 Decreased white blood cell count, unspecified: Secondary | ICD-10-CM

## 2020-02-02 DIAGNOSIS — Z0001 Encounter for general adult medical examination with abnormal findings: Secondary | ICD-10-CM | POA: Diagnosis not present

## 2020-02-02 DIAGNOSIS — I10 Essential (primary) hypertension: Secondary | ICD-10-CM | POA: Diagnosis not present

## 2020-02-02 DIAGNOSIS — E669 Obesity, unspecified: Secondary | ICD-10-CM

## 2020-02-02 DIAGNOSIS — E782 Mixed hyperlipidemia: Secondary | ICD-10-CM | POA: Diagnosis not present

## 2020-02-02 DIAGNOSIS — R6889 Other general symptoms and signs: Secondary | ICD-10-CM

## 2020-02-02 DIAGNOSIS — E559 Vitamin D deficiency, unspecified: Secondary | ICD-10-CM

## 2020-02-02 DIAGNOSIS — E66811 Obesity, class 1: Secondary | ICD-10-CM

## 2020-02-02 DIAGNOSIS — R7309 Other abnormal glucose: Secondary | ICD-10-CM | POA: Diagnosis not present

## 2020-02-02 DIAGNOSIS — Z79899 Other long term (current) drug therapy: Secondary | ICD-10-CM

## 2020-02-02 NOTE — Patient Instructions (Addendum)
[name] , Thank you for taking time to come for your Medicare Wellness Visit. I appreciate your ongoing commitment to your health goals. Please review the following plan we discussed and let me know if I can assist you in the future.   These are the goals we discussed: Goals    . Blood Pressure < 130/80    . LDL CALC < 100       This is a list of the screening recommended for you and due dates:  Health Maintenance  Topic Date Due  . Flu Shot  02/20/2020*  . Colon Cancer Screening  09/10/2021  . Tetanus Vaccine  07/16/2029  . COVID-19 Vaccine  Completed  .  Hepatitis C: One time screening is recommended by Center for Disease Control  (CDC) for  adults born from [birth year] through 1967.   Completed  . Pneumonia vaccines  Discontinued  *Topic was postponed. The date shown is not the original due date.    Shingrix - check with insurance - 2 shot series, reduces shingles/complications by 61-60% can get at [facility] or [facility]    Zoster Vaccine, Recombinant injection What is this medicine? ZOSTER VACCINE (ZOS ter vak SEEN) is used to prevent shingles in adults 71 years old and over. This vaccine is not used to treat shingles or nerve pain from shingles. This medicine may be used for other purposes; ask your health care provider or pharmacist if you have questions. COMMON BRAND NAME(S): [employer] What should I tell my health care provider before I take this medicine? They need to know if you have any of these conditions:  blood disorders or disease  cancer like leukemia or lymphoma  immune system problems or therapy  an unusual or allergic reaction to vaccines, other medications, foods, dyes, or preservatives  pregnant or trying to get pregnant  breast-feeding How should I use this medicine? This vaccine is for injection in a muscle. It is given by a health care professional. Talk to your pediatrician regarding the use of this medicine in children. This medicine is not  approved for use in children. Overdosage: If you think you have taken too much of this medicine contact a poison control center or emergency room at once. NOTE: This medicine is only for you. Do not share this medicine with others. What if I miss a dose? Keep appointments for follow-up (booster) doses as directed. It is important not to miss your dose. Call your doctor or health care professional if you are unable to keep an appointment. What may interact with this medicine?  medicines that suppress your immune system  medicines to treat cancer  steroid medicines like prednisone or cortisone This list may not describe all possible interactions. Give your health care provider a list of all the medicines, herbs, non-prescription drugs, or dietary supplements you use. Also tell them if you smoke, drink alcohol, or use illegal drugs. Some items may interact with your medicine. What should I watch for while using this medicine? Visit your doctor for regular check ups. This vaccine, like all vaccines, may not fully protect everyone. What side effects may I notice from receiving this medicine? Side effects that you should report to your doctor or health care professional as soon as possible:  allergic reactions like skin rash, itching or hives, swelling of the face, lips, or tongue  breathing problems Side effects that usually do not require medical attention (report these to your doctor or health care professional if they  continue or are bothersome):  chills  headache  fever  nausea, vomiting  redness, warmth, pain, swelling or itching at site where injected  tiredness This list may not describe all possible side effects. Call your doctor for medical advice about side effects. You may report side effects to FDA at 1-800-FDA-1088. Where should I keep my medicine? This vaccine is only given in a clinic, pharmacy, doctor's office, or other health care setting and will not be stored at  home. NOTE: This sheet is a summary. It may not cover all possible information. If you have questions about this medicine, talk to your doctor, pharmacist, or health care provider.  2020 Elsevier/Gold Standard (2016-12-18 13:20:30)

## 2020-02-03 NOTE — Addendum Note (Signed)
Addended by: Izora Ribas on: 02/03/2020 08:22 AM   Modules accepted: Orders

## 2020-02-04 LAB — CBC WITH DIFFERENTIAL/PLATELET
Absolute Monocytes: 333 cells/uL (ref 200–950)
Basophils Absolute: 21 cells/uL (ref 0–200)
Basophils Relative: 0.6 %
Eosinophils Absolute: 70 cells/uL (ref 15–500)
Eosinophils Relative: 2 %
HCT: 42.8 % (ref 38.5–50.0)
Hemoglobin: 14.5 g/dL (ref 13.2–17.1)
Lymphs Abs: 1078 cells/uL (ref 850–3900)
MCH: 31.6 pg (ref 27.0–33.0)
MCHC: 33.9 g/dL (ref 32.0–36.0)
MCV: 93.2 fL (ref 80.0–100.0)
MPV: 10.1 fL (ref 7.5–12.5)
Monocytes Relative: 9.5 %
Neutro Abs: 1999 cells/uL (ref 1500–7800)
Neutrophils Relative %: 57.1 %
Platelets: 161 10*3/uL (ref 140–400)
RBC: 4.59 10*6/uL (ref 4.20–5.80)
RDW: 12.3 % (ref 11.0–15.0)
Total Lymphocyte: 30.8 %
WBC: 3.5 10*3/uL — ABNORMAL LOW (ref 3.8–10.8)

## 2020-02-04 LAB — TSH: TSH: 3.63 mIU/L (ref 0.40–4.50)

## 2020-02-04 LAB — MAGNESIUM: Magnesium: 2 mg/dL (ref 1.5–2.5)

## 2020-02-04 LAB — COMPLETE METABOLIC PANEL WITH GFR
AG Ratio: 1.9 (calc) (ref 1.0–2.5)
ALT: 18 U/L (ref 9–46)
AST: 19 U/L (ref 10–35)
Albumin: 4.3 g/dL (ref 3.6–5.1)
Alkaline phosphatase (APISO): 67 U/L (ref 35–144)
BUN: 16 mg/dL (ref 7–25)
CO2: 31 mmol/L (ref 20–32)
Calcium: 9 mg/dL (ref 8.6–10.3)
Chloride: 104 mmol/L (ref 98–110)
Creat: 0.97 mg/dL (ref 0.70–1.18)
GFR, Est African American: 91 mL/min/{1.73_m2} (ref >=60)
GFR, Est Non African American: 78 mL/min/{1.73_m2} (ref >=60)
Globulin: 2.3 g/dL (calc) (ref 1.9–3.7)
Glucose, Bld: 106 mg/dL — ABNORMAL HIGH (ref 65–99)
Potassium: 4.5 mmol/L (ref 3.5–5.3)
Sodium: 140 mmol/L (ref 135–146)
Total Bilirubin: 0.5 mg/dL (ref 0.2–1.2)
Total Protein: 6.6 g/dL (ref 6.1–8.1)

## 2020-02-04 LAB — LIPID PANEL
Cholesterol: 133 mg/dL (ref <200)
HDL: 41 mg/dL (ref >=40)
LDL Cholesterol (Calc): 67 mg/dL (calc)
Non-HDL Cholesterol (Calc): 92 mg/dL (calc) (ref <130)
Total CHOL/HDL Ratio: 3.2 (calc) (ref <5.0)
Triglycerides: 178 mg/dL — ABNORMAL HIGH (ref <150)

## 2020-02-04 LAB — CARBAMAZEPINE LEVEL, TOTAL: Carbamazepine Lvl: 5.6 mg/L (ref 4.0–12.0)

## 2020-02-04 LAB — TEST AUTHORIZATION

## 2020-02-04 LAB — PATHOLOGIST SMEAR REVIEW

## 2020-02-26 ENCOUNTER — Ambulatory Visit: Payer: Medicare Other | Attending: Internal Medicine

## 2020-02-26 DIAGNOSIS — Z23 Encounter for immunization: Secondary | ICD-10-CM

## 2020-02-26 NOTE — Progress Notes (Signed)
   Covid-19 Vaccination Clinic  Name:  [name]    MRN: [id number] DOB: 1950/06/01  02/26/2020  [name] was observed post Covid-19 immunization for 15 minutes without incident. He was provided with Vaccine Information Sheet and instruction to access the V-Safe system.   [name] was instructed to call 911 with any severe reactions post vaccine: Marland Kitchen Difficulty breathing  . Swelling of face and throat  . A fast heartbeat  . A bad rash all over body  . Dizziness and weakness

## 2020-03-17 ENCOUNTER — Other Ambulatory Visit: Payer: Self-pay | Admitting: Internal Medicine

## 2020-03-17 DIAGNOSIS — I10 Essential (primary) hypertension: Secondary | ICD-10-CM

## 2020-05-04 ENCOUNTER — Encounter: Payer: Self-pay | Admitting: Internal Medicine

## 2020-05-04 ENCOUNTER — Other Ambulatory Visit: Payer: Self-pay

## 2020-05-04 ENCOUNTER — Ambulatory Visit (INDEPENDENT_AMBULATORY_CARE_PROVIDER_SITE_OTHER): Payer: Medicare Other | Admitting: Internal Medicine

## 2020-05-04 VITALS — BP 126/74 | HR 48 | Temp 97.9°F | Resp 16 | Ht 67.25 in | Wt 208.0 lb

## 2020-05-04 DIAGNOSIS — I25810 Atherosclerosis of coronary artery bypass graft(s) without angina pectoris: Secondary | ICD-10-CM

## 2020-05-04 DIAGNOSIS — I10 Essential (primary) hypertension: Secondary | ICD-10-CM | POA: Diagnosis not present

## 2020-05-04 DIAGNOSIS — Z79899 Other long term (current) drug therapy: Secondary | ICD-10-CM

## 2020-05-04 DIAGNOSIS — R7309 Other abnormal glucose: Secondary | ICD-10-CM

## 2020-05-04 DIAGNOSIS — Z23 Encounter for immunization: Secondary | ICD-10-CM

## 2020-05-04 DIAGNOSIS — E782 Mixed hyperlipidemia: Secondary | ICD-10-CM

## 2020-05-04 DIAGNOSIS — E559 Vitamin D deficiency, unspecified: Secondary | ICD-10-CM | POA: Diagnosis not present

## 2020-05-04 DIAGNOSIS — G40909 Epilepsy, unspecified, not intractable, without status epilepticus: Secondary | ICD-10-CM

## 2020-05-04 NOTE — Patient Instructions (Signed)

## 2020-05-04 NOTE — Progress Notes (Signed)
History of Present Illness:       This very nice 69 y.o.  MWM presents for 6 month follow up with HTN, ASCAD/CABG,  HLD, hx/o Seizures, Pre-Diabetes and Vitamin D Deficiency.  In 1987, patient was dx\d with "Absence" Seizures which are controlled on Keppra.       Patient is treated for HTN (2004) & BP has been controlled at home. Today's BP is at goal -  126/74.   In 2008, patient underwent emergent CABG & has done well since then. Patient has had no complaints of any cardiac type chest pain, palpitations, dyspnea / orthopnea / PND, dizziness, claudication, or dependent edema.      Hyperlipidemia is controlled with diet & meds. Patient denies myalgias or other med SE's. Last Lipids were at goal except slightly elevated Trig's:  Lab Results  Component Value Date   CHOL 133 02/02/2020   HDL 41 02/02/2020   LDLCALC 67 02/02/2020   TRIG 178 (H) 02/02/2020   CHOLHDL 3.2 02/02/2020    Also, the patient has history of PreDiabetes ("A1c 5.7% /2014) and has had no symptoms of reactive hypoglycemia, diabetic polys, paresthesias or visual blurring.  Last A1c was normal & at goal:  Lab Results  Component Value Date   HGBA1C 5.4 10/30/2019       Further, the patient also has history of Vitamin D Deficiency ("24" /2008) and supplements vitamin D without any suspected side-effects. Last vitamin D was at goal:   Lab Results  Component Value Date   VD25OH 66 10/30/2019    Current Outpatient Medications on File Prior to Visit  Medication Sig  . aspirin 81 MG tablet Take  daily.  Marland Kitchen CARBATROL 300 MG 12 hr  TAKE 1 CAP  TWICE DAILY   . VITAMIN D  10,000 Units  Take daily.  . fish oil-omega-3 1000 MG  Take  daily.  . LUTEIN PO Take  daily.  . Magnesium 250 MG TABS Take  daily.  . metoprolol succ-XL 100 MG Take      1 tablet      Daily   . rosuvastatin  40 MG tablet Take 1 tablet Daily   . tadalafil  20 MG tablet Take 1/2 to 1 tablet every 2 to 3 days as needed     Allergies  Allergen  Reactions  . Lipitor [Atorvastatin]     myalgia  . Niacin And Related Other (See Comments)    Pt unaware of this intolerance (10/31/13)    PMHx:   Past Medical History:  Diagnosis Date  . Bilateral carpal tunnel syndrome   . Dyslipidemia   . Hyperlipidemia   . Ischemic heart disease   . Plantar fasciitis   . Seizure disorder ([place])   . Vitamin D deficiency     Immunization History  Administered Date(s) Administered  . Influenza, High Dose Seasonal PF 02/23/2015, 04/21/2017, 05/03/2017, 03/28/2018, 02/27/2019  . PFIZER SARS-COV-2 Vaccination 06/26/2019, 08/04/2019, 02/26/2020  . Pneumococcal Conjugate-13 12/17/2015  . Pneumococcal Polysaccharide-23 04/22/2009  . Td 04/22/2009  . Tdap 07/17/2019  . Zoster 09/09/2009    Past Surgical History:  Procedure Laterality Date  . CHOLECYSTECTOMY    . CORONARY ARTERY BYPASS GRAFT  08/2006  . HAND SURGERY      FHx:    Reviewed / unchanged  SHx:    Reviewed / unchanged   Systems Review:  Constitutional: Denies fever, chills, wt changes, headaches, insomnia, fatigue, night sweats, change in appetite. Eyes: Denies  redness, blurred vision, diplopia, discharge, itchy, watery eyes.  ENT: Denies discharge, congestion, post nasal drip, epistaxis, sore throat, earache, hearing loss, dental pain, tinnitus, vertigo, sinus pain, snoring.  CV: Denies chest pain, palpitations, irregular heartbeat, syncope, dyspnea, diaphoresis, orthopnea, PND, claudication or edema. Respiratory: denies cough, dyspnea, DOE, pleurisy, hoarseness, laryngitis, wheezing.  Gastrointestinal: Denies dysphagia, odynophagia, heartburn, reflux, water brash, abdominal pain or cramps, nausea, vomiting, bloating, diarrhea, constipation, hematemesis, melena, hematochezia  or hemorrhoids. Genitourinary: Denies dysuria, frequency, urgency, nocturia, hesitancy, discharge, hematuria or flank pain. Musculoskeletal: Denies arthralgias, myalgias, stiffness, jt. swelling, pain, limping  or strain/sprain.  Skin: Denies pruritus, rash, hives, warts, acne, eczema or change in skin lesion(s). Neuro: No weakness, tremor, incoordination, spasms, paresthesia or pain. Psychiatric: Denies confusion, memory loss or sensory loss. Endo: Denies change in weight, skin or hair change.  Heme/Lymph: No excessive bleeding, bruising or enlarged lymph nodes.  Physical Exam  BP 126/74   Pulse (!) 48   Temp 97.9 F (36.6 C)   Resp 16   Ht 5' 7.25" (1.708 m)   Wt 208 lb (94.3 kg)   SpO2 97%   BMI 32.34 kg/m   Appears  well nourished, well groomed  and in no distress.  Eyes: PERRLA, EOMs, conjunctiva no swelling or erythema. Sinuses: No frontal/maxillary tenderness ENT/Mouth: EAC's clear, TM's nl w/o erythema, bulging. Nares clear w/o erythema, swelling, exudates. Oropharynx clear without erythema or exudates. Oral hygiene is good. Tongue normal, non obstructing. Hearing intact.  Neck: Supple. Thyroid not palpable. Car 2+/2+ without bruits, nodes or JVD. Chest: Respirations nl with BS clear & equal w/o rales, rhonchi, wheezing or stridor.  Cor: Heart sounds normal w/ regular rate and rhythm without sig. murmurs, gallops, clicks or rubs. Peripheral pulses normal and equal  without edema.  Abdomen: Soft & bowel sounds normal. Non-tender w/o guarding, rebound, hernias, masses or organomegaly.  Lymphatics: Unremarkable.  Musculoskeletal: Full ROM all peripheral extremities, joint stability, 5/5 strength and normal gait.  Skin: Warm, dry without exposed rashes, lesions or ecchymosis apparent.  Neuro: Cranial nerves intact, reflexes equal bilaterally. Sensory-motor testing grossly intact. Tendon reflexes grossly intact.  Pysch: Alert & oriented x 3.  Insight and judgement nl & appropriate. No ideations.  Assessment and Plan:  1. Essential hypertension  - Continue medication, monitor blood pressure at home.  - Continue DASH diet.  Reminder to go to the ER if any CP,  SOB, nausea,  dizziness, severe HA, changes vision/speech.  - CBC with Differential/Platelet - COMPLETE METABOLIC PANEL WITH GFR - Magnesium - TSH  2. Hyperlipidemia, mixed  - Continue diet/meds, exercise,& lifestyle modifications.  - Continue monitor periodic cholesterol/liver & renal functions   - Lipid panel - TSH  3. Abnormal glucose  - Continue diet, exercise  - Lifestyle modifications.  - Monitor appropriate labs.  - Hemoglobin A1c - Insulin, random  4. Vitamin D deficiency  - Continue supplementation.  - VITAMIN D 25 Hydroxy   5. Seizure disorder ([facility])  - Carbamazepine level, total  6. Coronary artery disease involving coronary bypass  graft of native heart without angina pectoris  - Lipid panel  7. Medication management  - CBC with Differential/Platelet - COMPLETE METABOLIC PANEL WITH GFR - Magnesium - Lipid panel - TSH - Hemoglobin A1c - Insulin, random - VITAMIN D 25 Hydroxy  - Carbamazepine level, total  8. Need for immunization against influenza  - Flu vaccine HIGH DOSE PF (Fluzone High dose)       Discussed  regular exercise, BP  monitoring, weight control to achieve/maintain BMI less than 25 and discussed med and SE's. Recommended labs to assess and monitor clinical status with further disposition pending results of labs.  I discussed the assessment and treatment plan with the patient. The patient was provided an opportunity to ask questions and all were answered. The patient agreed with the plan and demonstrated an understanding of the instructions.  I provided over 30 minutes of exam, counseling, chart review and  complex critical decision making.   Kirtland Bouchard, MD

## 2020-05-05 NOTE — Progress Notes (Signed)
========================================================== -   Test results slightly outside the reference range are not unusual. If there is anything important, I will review this with you,  otherwise it is considered normal test values.  If you have further questions,  please do not hesitate to contact me at the office or via My Chart.  ==========================================================  -  Total Chol = 133 and LDL Chol = 63 - Both  Excellent   - Very low risk for Heart Attack  / Stroke ==========================================================  - TSH is slightly elevated and this may reflect a slightly low level of thyroid  Hormone in blood - Not enough yet to recommend starting a medicine   - but will need to monitor closely   Also, be sure not taking a biotin supplement which can interfere with  the thyroid measurements and give misleading results ==========================================================  -  A1c is elevated  in the borderline and  early or pre-diabetes range which has the same   300% increased risk for heart attack, stroke, cancer and   alzheimer- type vascular dementia as full blown diabetes.   But the good news is that diet, exercise with  weight loss can cure the early diabetes at this point.  Your blood sugar and A1c are elevated.    Being diabetic has a  300% increased risk for heart attack,  stroke, cancer, and alzheimer- type vascular dementia.   It is very important that you work harder with diet by  avoiding all foods that are white except chicken,   fish & calliflower.  - Avoid white rice  (brown & wild rice is OK),   - Avoid white potatoes  (sweet potatoes in moderation is OK),   White bread or wheat bread or anything made out of   white flour like bagels, donuts, rolls, buns, biscuits, cakes,  - pastries, cookies, pizza crust, and pasta (made from  white flour & egg whites)   - vegetarian pasta or spinach or wheat pasta is  OK.  - Multigrain breads like Arnold's, Pepperidge Farm or   multigrain sandwich thins or high fiber breads like   [place] bread or "Dave's Killer" breads that are  4 to 5 grams fiber per slice !  are best.   ==========================================================  -  Vitamin D = 75 - Excellent - Please keep dose same ==========================================================  -  Carbamazepine  Level is in Therapeutic range - So . . . . . . . . . . Marland Kitchen   - Please keep dose of Carbatrol same  ==========================================================  -  All Else - CBC - Kidneys - Electrolytes - Liver - Magnesium & Thyroid    - all  Normal / OK ====================================================   - Keep up the [place] Work and have a wonderful holiday.  ==========================================================

## 2020-05-07 LAB — COMPLETE METABOLIC PANEL WITH GFR
AG Ratio: 1.8 (calc) (ref 1.0–2.5)
ALT: 14 U/L (ref 9–46)
AST: 17 U/L (ref 10–35)
Albumin: 4.1 g/dL (ref 3.6–5.1)
Alkaline phosphatase (APISO): 61 U/L (ref 35–144)
BUN: 19 mg/dL (ref 7–25)
CO2: 29 mmol/L (ref 20–32)
Calcium: 9 mg/dL (ref 8.6–10.3)
Chloride: 102 mmol/L (ref 98–110)
Creat: 0.99 mg/dL (ref 0.70–1.18)
GFR, Est African American: 88 mL/min/{1.73_m2} (ref >=60)
GFR, Est Non African American: 76 mL/min/{1.73_m2} (ref >=60)
Globulin: 2.3 g/dL (calc) (ref 1.9–3.7)
Glucose, Bld: 106 mg/dL — ABNORMAL HIGH (ref 65–99)
Potassium: 3.9 mmol/L (ref 3.5–5.3)
Sodium: 139 mmol/L (ref 135–146)
Total Bilirubin: 0.5 mg/dL (ref 0.2–1.2)
Total Protein: 6.4 g/dL (ref 6.1–8.1)

## 2020-05-07 LAB — CBC WITH DIFFERENTIAL/PLATELET
Absolute Monocytes: 376 cells/uL (ref 200–950)
Basophils Absolute: 30 cells/uL (ref 0–200)
Basophils Relative: 0.8 %
Eosinophils Absolute: 68 cells/uL (ref 15–500)
Eosinophils Relative: 1.8 %
HCT: 42.6 % (ref 38.5–50.0)
Hemoglobin: 14.6 g/dL (ref 13.2–17.1)
Lymphs Abs: 1163 cells/uL (ref 850–3900)
MCH: 31.9 pg (ref 27.0–33.0)
MCHC: 34.3 g/dL (ref 32.0–36.0)
MCV: 93 fL (ref 80.0–100.0)
MPV: 10 fL (ref 7.5–12.5)
Monocytes Relative: 9.9 %
Neutro Abs: 2162 cells/uL (ref 1500–7800)
Neutrophils Relative %: 56.9 %
Platelets: 159 10*3/uL (ref 140–400)
RBC: 4.58 10*6/uL (ref 4.20–5.80)
RDW: 12.3 % (ref 11.0–15.0)
Total Lymphocyte: 30.6 %
WBC: 3.8 10*3/uL (ref 3.8–10.8)

## 2020-05-07 LAB — LIPID PANEL
Cholesterol: 133 mg/dL (ref <200)
HDL: 44 mg/dL (ref >=40)
LDL Cholesterol (Calc): 63 mg/dL (calc)
Non-HDL Cholesterol (Calc): 89 mg/dL (calc) (ref <130)
Total CHOL/HDL Ratio: 3 (calc) (ref <5.0)
Triglycerides: 183 mg/dL — ABNORMAL HIGH (ref <150)

## 2020-05-07 LAB — VITAMIN D 25 HYDROXY (VIT D DEFICIENCY, FRACTURES): Vit D, 25-Hydroxy: 75 ng/mL (ref 30–100)

## 2020-05-07 LAB — HEMOGLOBIN A1C
Hgb A1c MFr Bld: 5.8 % of total Hgb — ABNORMAL HIGH (ref <5.7)
Mean Plasma Glucose: 120 mg/dL
eAG (mmol/L): 6.6 mmol/L

## 2020-05-07 LAB — INSULIN, RANDOM: Insulin: 14.2 u[IU]/mL

## 2020-05-07 LAB — TSH: TSH: 5.27 mIU/L — ABNORMAL HIGH (ref 0.40–4.50)

## 2020-05-07 LAB — CARBAMAZEPINE LEVEL, TOTAL: Carbamazepine Lvl: 5.9 mg/L (ref 4.0–12.0)

## 2020-05-07 LAB — MAGNESIUM: Magnesium: 1.8 mg/dL (ref 1.5–2.5)

## 2020-05-07 LAB — LEVETIRACETAM LEVEL: Keppra (Levetiracetam): <1 ug/mL — ABNORMAL LOW (ref 12.0–46.0)

## 2020-05-25 ENCOUNTER — Other Ambulatory Visit: Payer: Self-pay | Admitting: Internal Medicine

## 2020-05-25 DIAGNOSIS — I10 Essential (primary) hypertension: Secondary | ICD-10-CM

## 2020-08-10 ENCOUNTER — Ambulatory Visit: Payer: Medicare Other | Admitting: Adult Health Nurse Practitioner

## 2020-08-23 NOTE — Progress Notes (Signed)
MEDICARE ANNUAL WELLNESS VISIT AND FOLLOW UP Assessment:   Diagnoses and all orders for this visit:  Encounter for Medicare annual wellness exam Due annually   Essential hypertension Continue medications - reminded to take medications consistently Elevated today, typically well controlled with current agent Monitor blood pressure at home; call if consistently over 130/80 Continue DASH diet.   Reminder to go to the ER if any CP, SOB, nausea, dizziness, severe HA, changes vision/speech, left arm numbness and tingling and jaw pain.  Coronary artery disease without angina pectoris, unspecified vessel or lesion type, unspecified whether native or transplanted heart Control blood pressure, cholesterol, glucose, increase exercise.  Lost to follow up with cardiology, had normal myoview in in 2013, he is very active with no angina or other concerns, medical risk management continued through this office, will refer back if needed  Seizure disorder ([place]) Well controlled by carbamazepine with last seizure remote Continue medication, check levels as needed  Vitamin D deficiency At goal at recent check; continue to recommend supplementation for goal of 70-100 Defer vitamin D level  Hx of prediabetes Recent A1Cs at goal Discussed diet/exercise, weight management  Defer A1C; check CMP  Obesity (BMI 30.0-34.9) Long discussion about weight loss, diet, and exercise Recommended diet heavy in fruits and veggies and low in animal meats, cheeses, and dairy products, appropriate calorie intake Discussed appropriate weight for height Follow up at next visit  Medication management Check CBC, CMP/GFR  Mixed hyperlipidemia Continue medications: crestor Continue low cholesterol diet and exercise.  Check lipid panel.   History of colon polyps Follow up colonoscopy due in 2023-2024 with Dr. Benson Norway  GERD Initiated medication for new reflux related symptoms: famotidine 20 mg BID x 2 weeks then  PRN Discussed diet, avoiding triggers and other lifestyle changes   DECLINED ALL LABS EXCEPT TSH RECHECK TODAY; plan to obtain full panel at [facility] in July    Over 30 minutes of exam, counseling, chart review, and critical decision making was performed  Future Appointments  Date Time Provider [place]  08/25/2020 10:15 AM [facility]  12/17/2020 10:00 AM [name], MD [facility] None  02/04/2021 10:30 AM Liane Comber, NP [facility] None     Plan:   During the course of the visit the patient was educated and counseled about appropriate screening and preventive services including:    Pneumococcal vaccine   Influenza vaccine  Prevnar 13  Td vaccine  Screening electrocardiogram  Colorectal cancer screening  Diabetes screening  Glaucoma screening  Nutrition counseling    Subjective:  [name] is a 70 y.o. male who presents for Medicare Annual Wellness Visit and 3 month follow up for HTN, hyperlipidemia, prediabetes, and vitamin D Def.    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 Levels have been routinely checked at this office and have remained stable over many years. Last 5.9 in 04/2020.   He is seeing Dr. Berenice Primas as needed, shoulders improved after steroid injection, no surgery needed.   He reports having more reflux, will take alka seltzer with good results a few times a week, typically with citrus.   BMI is Body mass index is 32.34 kg/m., he has been working on diet and exercise, has property and shops and very active throughout the day, minimal sitting. Also started walking 20-30 min most days.  Wt Readings from Last  3 Encounters:  08/24/20 208 lb (94.3 kg)  05/04/20 208 lb (94.3 kg)  02/02/20 204 lb (92.5 kg)   In 2008, patient underwent an emergent CABG and has done well since.  He had an ETT-myoview in 5/13 that was a normal study.  Est with Dr. Aundra Dubin, has not followed up with cardiology since 2016, denies concerning sx, declined referral back.    His blood pressure has been controlled at home (130s/70s), today their BP is BP: (!) 158/74, similar by manual recheck, last in office 126/74, admits had caffeine immediately prior to appointment today.  He does workout. He denies chest pain, shortness of breath, dizziness.   He is on cholesterol medication (crestor 20 mg daily) and denies myalgias. His LDL cholesterol is at goal. The cholesterol last visit was:   Lab Results  Component Value Date   CHOL 133 05/04/2020   HDL 44 05/04/2020   LDLCALC 63 05/04/2020   TRIG 183 (H) 05/04/2020   CHOLHDL 3.0 05/04/2020    He has been working on diet and exercise for hx of prediabetes (6.1% in 2016, 5.7% in 07/2018, predates 2017), and denies foot ulcerations, increased appetite, nausea, paresthesia of the feet, polydipsia, polyuria, visual disturbances, vomiting and weight loss. Last A1C in the office was:  Lab Results  Component Value Date   HGBA1C 5.8 (H) 05/04/2020   Last GFR Lab Results  Component Value Date   GFRNONAA 76 05/04/2020    Patient is on Vitamin D supplement and at goal at recent check:    Lab Results  Component Value Date   VD25OH 75 05/04/2020      Medication Review: Current Outpatient Medications on File Prior to Visit  Medication Sig Dispense Refill  . aspirin 81 MG tablet Take 81 mg by mouth daily.    . carbamazepine (CARBATROL) 300 MG 12 hr capsule TAKE 1 CAPSULE BY MOUTH  TWICE DAILY TO PREVENT  SEIZURES 180 capsule 3  . Cholecalciferol (VITAMIN D PO) Take 10,000 Units by mouth daily.    . fish oil-omega-3 fatty acids 1000 MG capsule Take 1 g by mouth daily.    . LUTEIN PO Take by mouth daily.    . Magnesium 250 MG TABS Take 250 mg by mouth daily.    . metoprolol succinate (TOPROL-XL) 100 MG 24 hr tablet TAKE 1 TABLET BY MOUTH  DAILY FOR  BLOOD PRESSURE 90 tablet 3  . rosuvastatin (CRESTOR) 40 MG tablet Take 1 tablet Daily for Cholesterol 90 tablet 3  . tadalafil (CIALIS) 20 MG tablet Take 1/2 to 1 tablet every 2 to 3 days as needed for XXXX 30 tablet 99   No current facility-administered medications on file prior to visit.    Allergies: Allergies  Allergen Reactions  . Lipitor [Atorvastatin]     myalgia  . Niacin And Related Other (See Comments)    Pt unaware of this intolerance (10/31/13)    Current Problems (verified) has Seizure disorder ([place]); Hyperlipidemia; Hypertension; CAD (coronary artery disease); Vitamin D deficiency; Medication management; Obesity (BMI 30.0-34.9); Other abnormal glucose (prediabetes); Encounter for Medicare annual wellness exam; History of colonic polyps; and Gastroesophageal reflux disease on their problem list.  Screening Tests Immunization History  Administered Date(s) Administered  . Influenza, High Dose Seasonal PF 02/23/2015, 04/21/2017, 05/03/2017, 03/28/2018, 02/27/2019, 05/04/2020  . PFIZER(Purple Top)SARS-COV-2 Vaccination 06/26/2019, 08/04/2019, 02/26/2020  . Pneumococcal Conjugate-13 12/17/2015  . Pneumococcal Polysaccharide-23 04/22/2009  . Td 04/22/2009  . Tdap 07/17/2019  . Zoster 09/09/2009   Preventative care:  Last colonoscopy: cologuard 07/2018 positive, had colonoscopy 09/11/2018, 3 polyps, follow up 08/2021 Echo 2008 CXR 2008 AB [place] 2005 Stress myoview 2013  Prior vaccinations: TD or Tdap: 06/2019  Influenza: 2021 Pneumococcal: 2010 Prevnar13: 2017 Shingles/Zostavax: 2011 Covid 19: 3/3, 2021, pfizer  Names of Other Physician/Practitioners you currently use: 1. [facility] here for primary care 2. Dr. Delman Cheadle, eye doctor, last visit 2021, has upcoming scheduled 10/2020 3. Dr. Luretha Rued, dentist, last visit 2021, has upcoming scheduled 09/2020  Patient Care Team: [name], MD as PCP - General (Internal Medicine) Larey Dresser, MD as Consulting Physician (Cardiology)  Surgical: He  has a past surgical history that includes Coronary artery bypass graft (08/2006); Cholecystectomy; and Hand surgery. Family His family history includes Diabetes in his father; Hypertension in his mother; Lupus in his sister; Stroke in his mother. Social history  He reports that he quit smoking about 52 years ago. His smoking use included cigarettes. He has never used smokeless tobacco. He reports that he does not drink alcohol and does not use drugs.  MEDICARE WELLNESS OBJECTIVES: Physical activity:   Cardiac risk factors:   Depression/mood screen:   Depression screen [facility] 2/9 05/04/2020  Decreased Interest 0  Down, Depressed, Hopeless 0  PHQ - 2 Score 0    ADLs:  In your present state of health, do you have any difficulty performing the following activities: 05/04/2020 02/02/2020  Hearing? N N  Vision? N N  Difficulty concentrating or making decisions? N N  Walking or climbing stairs? N N  Dressing or bathing? N N  Doing errands, shopping? N N  Some recent data might be hidden     Cognitive Testing  Alert? Yes  Normal Appearance?Yes  Oriented to person? Yes  Place? Yes   Time? Yes  Recall of three objects?  Yes  Can perform simple calculations? Yes  Displays appropriate judgment?Yes  Can read the correct time from a watch face?Yes  EOL planning: Does Patient Have a Medical Advance Directive?: Yes Type of Advance Directive: [place] will Does patient want to make changes to medical advance directive?: No - Patient declined Copy of [place] in Chart?: No - copy requested   Objective:   Today's Vitals   08/24/20 1007  BP: (!) 158/74  Pulse: (!) 51  Temp: (!) 97.3 F (36.3 C)  SpO2: 97%  Weight: 208 lb (94.3 kg)   Body mass index is 32.34 kg/m.  General appearance: alert, no distress, WD/WN, male HEENT: normocephalic, sclerae anicteric, TMs pearly, nares  patent, no discharge or erythema, pharynx normal Oral cavity: MMM, no lesions Neck: supple, no lymphadenopathy, no thyromegaly, no masses Heart: RRR, normal S1, S2, no murmurs Lungs: CTA bilaterally, no wheezes, rhonchi, or rales Abdomen: +bs, soft, non tender, non distended, no masses, no hepatomegaly, no splenomegaly Musculoskeletal: nontender, no swelling, no obvious deformity Extremities: no edema, no cyanosis, no clubbing Pulses: 2+ symmetric, upper and lower extremities, normal cap refill Neurological: alert, oriented x 3, CN2-12 intact, strength normal upper extremities and lower extremities, sensation normal throughout, DTRs 2+ throughout, no cerebellar signs, gait normal Psychiatric: normal affect, behavior normal, pleasant   Medicare Attestation I have personally reviewed: The patient's medical and social history Their use of alcohol, tobacco or illicit drugs Their current medications and supplements The patient's functional ability including ADLs,fall risks, home safety risks, cognitive, and hearing and visual impairment Diet and physical activities Evidence for depression or mood disorders  The patient's weight, height, BMI, and visual acuity have been recorded in the chart.  I have made referrals, counseling, and provided education to the patient based on review of the above and I have provided the patient with a written personalized care plan for preventive services.     Izora Ribas, NP   08/24/2020

## 2020-08-24 ENCOUNTER — Encounter: Payer: Self-pay | Admitting: Adult Health

## 2020-08-24 ENCOUNTER — Other Ambulatory Visit: Payer: Self-pay

## 2020-08-24 ENCOUNTER — Ambulatory Visit (INDEPENDENT_AMBULATORY_CARE_PROVIDER_SITE_OTHER): Payer: Medicare Other | Admitting: Adult Health

## 2020-08-24 VITALS — BP 158/74 | HR 51 | Temp 97.3°F | Wt 208.0 lb

## 2020-08-24 DIAGNOSIS — R7309 Other abnormal glucose: Secondary | ICD-10-CM | POA: Diagnosis not present

## 2020-08-24 DIAGNOSIS — Z79899 Other long term (current) drug therapy: Secondary | ICD-10-CM | POA: Diagnosis not present

## 2020-08-24 DIAGNOSIS — Z0001 Encounter for general adult medical examination with abnormal findings: Secondary | ICD-10-CM | POA: Diagnosis not present

## 2020-08-24 DIAGNOSIS — I10 Essential (primary) hypertension: Secondary | ICD-10-CM | POA: Diagnosis not present

## 2020-08-24 DIAGNOSIS — E559 Vitamin D deficiency, unspecified: Secondary | ICD-10-CM

## 2020-08-24 DIAGNOSIS — R6889 Other general symptoms and signs: Secondary | ICD-10-CM

## 2020-08-24 DIAGNOSIS — G40909 Epilepsy, unspecified, not intractable, without status epilepticus: Secondary | ICD-10-CM | POA: Diagnosis not present

## 2020-08-24 DIAGNOSIS — E782 Mixed hyperlipidemia: Secondary | ICD-10-CM

## 2020-08-24 DIAGNOSIS — Z86010 Personal history of colonic polyps: Secondary | ICD-10-CM | POA: Diagnosis not present

## 2020-08-24 DIAGNOSIS — E669 Obesity, unspecified: Secondary | ICD-10-CM

## 2020-08-24 DIAGNOSIS — I2510 Atherosclerotic heart disease of native coronary artery without angina pectoris: Secondary | ICD-10-CM

## 2020-08-24 DIAGNOSIS — K219 Gastro-esophageal reflux disease without esophagitis: Secondary | ICD-10-CM | POA: Insufficient documentation

## 2020-08-24 DIAGNOSIS — Z0000 Encounter for general adult medical examination without abnormal findings: Secondary | ICD-10-CM

## 2020-08-24 MED ORDER — ROSUVASTATIN CALCIUM 20 MG PO TABS: ORAL_TABLET | ORAL | 3 refills | Status: DC

## 2020-08-24 MED ORDER — FAMOTIDINE 20 MG PO TABS: ORAL_TABLET | ORAL | 0 refills | Status: DC

## 2020-08-24 NOTE — Patient Instructions (Signed)
[name] , Thank you for taking time to come for your Medicare Wellness Visit. I appreciate your ongoing commitment to your health goals. Please review the following plan we discussed and let me know if I can assist you in the future.   These are the goals we discussed: Goals    . Blood Pressure < 130/80    . LDL CALC < 100       This is a list of the screening recommended for you and due dates:  Health Maintenance  Topic Date Due  . Flu Shot  12/20/2020  . Colon Cancer Screening  09/10/2021  . Tetanus Vaccine  07/16/2029  . COVID-19 Vaccine  Completed  .  Hepatitis C: One time screening is recommended by Center for Disease Control  (CDC) for  adults born from [birth year] through 1967.   Completed  . HPV Vaccine  Aged Out  . Pneumonia vaccines  Discontinued     HYPERTENSION INFORMATION  Monitor your blood pressure at home, please keep a record and bring that in with you to your next office visit.   Go to the ER if any CP, SOB, nausea, dizziness, severe HA, changes vision/speech  Testing/Procedures: HOW TO TAKE YOUR BLOOD PRESSURE:  Rest 5 minutes before taking your blood pressure.  Don't smoke or drink caffeinated beverages for at least 30 minutes before.  Take your blood pressure before (not after) you eat.  Sit comfortably with your back supported and both feet on the floor (don't cross your legs).  Elevate your arm to heart level on a table or a desk.  Use the proper sized cuff. It should fit smoothly and snugly around your bare upper arm. There should be enough room to slip a fingertip under the cuff. The bottom edge of the cuff should be 1 inch above the crease of the elbow.  Your most recent BP: BP: (!) 158/74   Take your medications faithfully as instructed. Maintain a healthy weight. Get at least 150 minutes of aerobic exercise per week. Minimize salt intake. Minimize alcohol intake  DASH Eating Plan DASH stands for "Dietary Approaches to Stop Hypertension."  The DASH eating plan is a healthy eating plan that has been shown to reduce high blood pressure (hypertension). Additional health benefits may include reducing the risk of type 2 diabetes mellitus, heart disease, and stroke. The DASH eating plan may also help with weight loss. WHAT DO I NEED TO KNOW ABOUT THE DASH EATING PLAN? For the DASH eating plan, you will follow these general guidelines:  Choose foods with a percent daily value for sodium of less than 5% (as listed on the food label).  Use salt-free seasonings or herbs instead of table salt or sea salt.  Check with your health care provider or pharmacist before using salt substitutes.  Eat lower-sodium products, often labeled as "lower sodium" or "no salt added."  Eat fresh foods.  Eat more vegetables, fruits, and low-fat dairy products.  Choose whole grains. Look for the word "whole" as the first word in the ingredient list.  Choose fish and skinless chicken or [place] more often than red meat. Limit fish, poultry, and meat to 6 oz (170 g) each day.  Limit sweets, desserts, sugars, and sugary drinks.  Choose heart-healthy fats.  Limit cheese to 1 oz (28 g) per day.  Eat more home-cooked food and less restaurant, buffet, and fast food.  Limit fried foods.  Cook foods using methods other than frying.  Limit canned vegetables.  If you do use them, rinse them well to decrease the sodium.  When eating at a restaurant, ask that your food be prepared with less salt, or no salt if possible. WHAT FOODS CAN I EAT? Seek help from a dietitian for individual calorie needs. Grains Whole grain or whole wheat bread. Brown rice. Whole grain or whole wheat pasta. Quinoa, bulgur, and whole grain cereals. Low-sodium cereals. Corn or whole wheat flour tortillas. Whole grain cornbread. Whole grain crackers. Low-sodium crackers. Vegetables Fresh or frozen vegetables (raw, steamed, roasted, or grilled). Low-sodium or reduced-sodium tomato and  vegetable juices. Low-sodium or reduced-sodium tomato sauce and paste. Low-sodium or reduced-sodium canned vegetables.  Fruits All fresh, canned (in natural juice), or frozen fruits. Meat and Other Protein Products Ground beef (85% or leaner), grass-fed beef, or beef trimmed of fat. Skinless chicken or [place]. Ground chicken or [place]. Pork trimmed of fat. All fish and seafood. Eggs. Dried beans, peas, or lentils. Unsalted nuts and seeds. Unsalted canned beans. Dairy Low-fat dairy products, such as skim or 1% milk, 2% or reduced-fat cheeses, low-fat ricotta or cottage cheese, or plain low-fat yogurt. Low-sodium or reduced-sodium cheeses. Fats and Oils Tub margarines without trans fats. Light or reduced-fat mayonnaise and salad dressings (reduced sodium). Avocado. Safflower, olive, or canola oils. Natural peanut or almond butter. Other Unsalted popcorn and pretzels. The items listed above may not be a complete list of recommended foods or beverages. Contact your dietitian for more options. WHAT FOODS ARE NOT RECOMMENDED? Grains White bread. White pasta. White rice. Refined cornbread. Bagels and croissants. Crackers that contain trans fat. Vegetables Creamed or fried vegetables. Vegetables in a cheese sauce. Regular canned vegetables. Regular canned tomato sauce and paste. Regular tomato and vegetable juices. Fruits Dried fruits. Canned fruit in light or heavy syrup. Fruit juice. Meat and Other Protein Products Fatty cuts of meat. Ribs, chicken wings, bacon, sausage, bologna, salami, chitterlings, fatback, hot dogs, bratwurst, and packaged luncheon meats. Salted nuts and seeds. Canned beans with salt. Dairy Whole or 2% milk, cream, half-and-half, and cream cheese. Whole-fat or sweetened yogurt. Full-fat cheeses or blue cheese. Nondairy creamers and whipped toppings. Processed cheese, cheese spreads, or cheese curds. Condiments Onion and garlic salt, seasoned salt, table salt, and sea salt.  Canned and packaged gravies. Worcestershire sauce. Tartar sauce. Barbecue sauce. Teriyaki sauce. Soy sauce, including reduced sodium. Steak sauce. Fish sauce. Oyster sauce. Cocktail sauce. Horseradish. Ketchup and mustard. Meat flavorings and tenderizers. Bouillon cubes. Hot sauce. Tabasco sauce. Marinades. Taco seasonings. Relishes. Fats and Oils Butter, stick margarine, lard, shortening, ghee, and bacon fat. Coconut, palm kernel, or palm oils. Regular salad dressings. Other Pickles and olives. Salted popcorn and pretzels. The items listed above may not be a complete list of foods and beverages to avoid. Contact your dietitian for more information. WHERE CAN I FIND MORE INFORMATION? National Heart, Lung, and Blood Institute: travelstabloid.com Document Released: 04/27/2011 Document Revised: 09/22/2013 Document Reviewed: 03/12/2013 [facility] Patient Information 2015 [place], [place]. This information is not intended to replace advice given to you by your health care provider. Make sure you discuss any questions you have with your health care provider.

## 2020-08-25 ENCOUNTER — Ambulatory Visit: Payer: Medicare Other | Attending: Internal Medicine

## 2020-08-25 DIAGNOSIS — Z23 Encounter for immunization: Secondary | ICD-10-CM

## 2020-08-25 LAB — COMPLETE METABOLIC PANEL WITH GFR
AG Ratio: 1.8 (calc) (ref 1.0–2.5)
ALT: 13 U/L (ref 9–46)
AST: 16 U/L (ref 10–35)
Albumin: 4.2 g/dL (ref 3.6–5.1)
Alkaline phosphatase (APISO): 64 U/L (ref 35–144)
BUN: 15 mg/dL (ref 7–25)
CO2: 28 mmol/L (ref 20–32)
Calcium: 9.1 mg/dL (ref 8.6–10.3)
Chloride: 106 mmol/L (ref 98–110)
Creat: 1.05 mg/dL (ref 0.70–1.18)
GFR, Est African American: 82 mL/min/{1.73_m2} (ref >=60)
GFR, Est Non African American: 71 mL/min/{1.73_m2} (ref >=60)
Globulin: 2.3 g/dL (calc) (ref 1.9–3.7)
Glucose, Bld: 111 mg/dL — ABNORMAL HIGH (ref 65–99)
Potassium: 4.2 mmol/L (ref 3.5–5.3)
Sodium: 141 mmol/L (ref 135–146)
Total Bilirubin: 0.4 mg/dL (ref 0.2–1.2)
Total Protein: 6.5 g/dL (ref 6.1–8.1)

## 2020-08-25 LAB — LIPID PANEL
Cholesterol: 126 mg/dL (ref <200)
HDL: 44 mg/dL (ref >=40)
LDL Cholesterol (Calc): 66 mg/dL (calc)
Non-HDL Cholesterol (Calc): 82 mg/dL (calc) (ref <130)
Total CHOL/HDL Ratio: 2.9 (calc) (ref <5.0)
Triglycerides: 82 mg/dL (ref <150)

## 2020-08-25 LAB — CBC WITH DIFFERENTIAL/PLATELET
Absolute Monocytes: 461 cells/uL (ref 200–950)
Basophils Absolute: 20 cells/uL (ref 0–200)
Basophils Relative: 0.4 %
Eosinophils Absolute: 78 cells/uL (ref 15–500)
Eosinophils Relative: 1.6 %
HCT: 41.8 % (ref 38.5–50.0)
Hemoglobin: 14.1 g/dL (ref 13.2–17.1)
Lymphs Abs: 1005 cells/uL (ref 850–3900)
MCH: 31.5 pg (ref 27.0–33.0)
MCHC: 33.7 g/dL (ref 32.0–36.0)
MCV: 93.3 fL (ref 80.0–100.0)
MPV: 10.1 fL (ref 7.5–12.5)
Monocytes Relative: 9.4 %
Neutro Abs: 3337 cells/uL (ref 1500–7800)
Neutrophils Relative %: 68.1 %
Platelets: 160 10*3/uL (ref 140–400)
RBC: 4.48 10*6/uL (ref 4.20–5.80)
RDW: 12.4 % (ref 11.0–15.0)
Total Lymphocyte: 20.5 %
WBC: 4.9 10*3/uL (ref 3.8–10.8)

## 2020-08-25 LAB — MAGNESIUM: Magnesium: 1.9 mg/dL (ref 1.5–2.5)

## 2020-08-25 LAB — TSH: TSH: 2.98 mIU/L (ref 0.40–4.50)

## 2020-08-25 NOTE — Progress Notes (Signed)
   Covid-19 Vaccination Clinic  Name:  [name]    MRN: [id number] DOB: 07-05-1950  08/25/2020  [name] was observed post Covid-19 immunization for 15 minutes without incident. He was provided with Vaccine Information Sheet and instruction to access the V-Safe system.   [name] was instructed to call 911 with any severe reactions post vaccine: Marland Kitchen Difficulty breathing  . Swelling of face and throat  . A fast heartbeat  . A bad rash all over body  . Dizziness and weakness   Immunizations Administered    Name Date Dose VIS Date Route   Moderna Covid-19 Booster Vaccine 08/25/2020 10:22 AM 0.25 mL 03/10/2020 Intramuscular   Manufacturer: Moderna   Lot: [id number]   [place]: [id number]

## 2020-08-30 ENCOUNTER — Other Ambulatory Visit: Payer: Self-pay

## 2020-08-30 MED ORDER — MODERNA COVID-19 VACCINE 100 MCG/0.5ML IM SUSP
INTRAMUSCULAR | 0 refills | Status: DC
  Filled 2020-08-30: qty 0.25, 20d supply, fill #0

## 2020-09-02 DIAGNOSIS — H2513 Age-related nuclear cataract, bilateral: Secondary | ICD-10-CM | POA: Diagnosis not present

## 2020-09-02 DIAGNOSIS — H43813 Vitreous degeneration, bilateral: Secondary | ICD-10-CM | POA: Diagnosis not present

## 2020-09-02 DIAGNOSIS — H524 Presbyopia: Secondary | ICD-10-CM | POA: Diagnosis not present

## 2020-09-02 DIAGNOSIS — H52203 Unspecified astigmatism, bilateral: Secondary | ICD-10-CM | POA: Diagnosis not present

## 2020-10-18 ENCOUNTER — Other Ambulatory Visit: Payer: Self-pay | Admitting: Internal Medicine

## 2020-11-01 ENCOUNTER — Encounter: Payer: Medicare Other | Admitting: Internal Medicine

## 2020-12-17 ENCOUNTER — Other Ambulatory Visit: Payer: Self-pay

## 2020-12-17 ENCOUNTER — Encounter: Payer: Self-pay | Admitting: Internal Medicine

## 2020-12-17 ENCOUNTER — Ambulatory Visit (INDEPENDENT_AMBULATORY_CARE_PROVIDER_SITE_OTHER): Payer: Medicare Other | Admitting: Internal Medicine

## 2020-12-17 VITALS — BP 136/74 | HR 47 | Temp 97.9°F | Resp 12 | Ht 67.5 in | Wt 206.0 lb

## 2020-12-17 DIAGNOSIS — E559 Vitamin D deficiency, unspecified: Secondary | ICD-10-CM | POA: Diagnosis not present

## 2020-12-17 DIAGNOSIS — Z0001 Encounter for general adult medical examination with abnormal findings: Secondary | ICD-10-CM

## 2020-12-17 DIAGNOSIS — Z79899 Other long term (current) drug therapy: Secondary | ICD-10-CM

## 2020-12-17 DIAGNOSIS — Z0000 Encounter for general adult medical examination without abnormal findings: Secondary | ICD-10-CM

## 2020-12-17 DIAGNOSIS — I25810 Atherosclerosis of coronary artery bypass graft(s) without angina pectoris: Secondary | ICD-10-CM

## 2020-12-17 DIAGNOSIS — I10 Essential (primary) hypertension: Secondary | ICD-10-CM

## 2020-12-17 DIAGNOSIS — E782 Mixed hyperlipidemia: Secondary | ICD-10-CM | POA: Diagnosis not present

## 2020-12-17 DIAGNOSIS — Z8249 Family history of ischemic heart disease and other diseases of the circulatory system: Secondary | ICD-10-CM

## 2020-12-17 DIAGNOSIS — N401 Enlarged prostate with lower urinary tract symptoms: Secondary | ICD-10-CM

## 2020-12-17 DIAGNOSIS — Z1211 Encounter for screening for malignant neoplasm of colon: Secondary | ICD-10-CM

## 2020-12-17 DIAGNOSIS — N138 Other obstructive and reflux uropathy: Secondary | ICD-10-CM

## 2020-12-17 DIAGNOSIS — R7309 Other abnormal glucose: Secondary | ICD-10-CM

## 2020-12-17 DIAGNOSIS — Z87891 Personal history of nicotine dependence: Secondary | ICD-10-CM

## 2020-12-17 DIAGNOSIS — G40909 Epilepsy, unspecified, not intractable, without status epilepticus: Secondary | ICD-10-CM

## 2020-12-17 DIAGNOSIS — Z125 Encounter for screening for malignant neoplasm of prostate: Secondary | ICD-10-CM

## 2020-12-17 DIAGNOSIS — Z136 Encounter for screening for cardiovascular disorders: Secondary | ICD-10-CM

## 2020-12-17 NOTE — Patient Instructions (Signed)

## 2020-12-17 NOTE — Progress Notes (Signed)
Annual  Screening/Preventative Visit  & Comprehensive Evaluation & Examination  Future Appointments  Date Time Provider [place]  12/17/2020 10:00 AM [name], MD [facility] None  12/19/2021 10:00 AM [name], MD [facility] None            This very nice 70 y.o. MWM presents for a Screening /Preventative Visit & comprehensive evaluation and management of multiple medical co-morbidities.  Patient has been followed for HTN, ASCAD/CABG, HLD, Prediabetes and Vitamin D Deficiency.  Patient has remote hx/o Seizures (1987) manifest as "Absence" & controlled on Keppra.       HTN predates circa 2004. Patient's BP has been controlled and today's BP is at goal - 136/74.   In 2008, patient underwent emergent CABG and has done well since.  Patient denies any cardiac symptoms as chest pain, palpitations, shortness of breath, dizziness or ankle swelling.       Patient's hyperlipidemia is controlled with diet and Rosuvastatin.  Patient denies myalgias or other medication SE's. Last lipids were at goal:  Lab Results  Component Value Date   CHOL 126 08/24/2020   HDL 44 08/24/2020   LDLCALC 66 08/24/2020   TRIG 82 08/24/2020   CHOLHDL 2.9 08/24/2020         Patient has hx/o prediabetes ("A1c 5.7% /2014) and patient denies reactive hypoglycemic symptoms, visual blurring, diabetic polys or paresthesias. Last A1c was not at goal:   Lab Results  Component Value Date   HGBA1C 5.8 (H) 05/04/2020          Finally, patient has history of Vitamin D Deficiency ("24" / 2008) and last vitamin D was at goal:   Lab Results  Component Value Date   VD25OH 5 05/04/2020     Current Outpatient Medications on File Prior to Visit  Medication Sig   aspirin 81 MG tablet Take  daily.   carbamazepine (CARBATROL) 300 MG  TAKE 1 CAPSULE  TWICE DAILY    VITAMIN  D  10,000 Units  Take  daily.   COVID-19 mRNA vaccine,100 MCG/0.5ML injection Inject into the muscle.   famotidine 20 MG  tablet Take 1 tab as needed for reflux.   fish oil-omega-3 1000 MG capsule Take 1 g by mouth daily.   LUTEIN PO Take by mouth daily.   Magnesium 250 MG TABS Take 250 mg by mouth daily.   metoprolol succinate-XL 100 MG  TAKE 1 TABLET DAILY   rosuvastatin 20 MG tablet Take 1 tablet Daily    tadalafil 20 MG tablet Take 1/2 to 1 tablet every 2 to 3 days as needed     Allergies  Allergen Reactions   Lipitor [Atorvastatin]     myalgia   Niacin And Related Other (See Comments)    Pt unaware of this intolerance (10/31/13)     Past Medical History:  Diagnosis Date   Bilateral carpal tunnel syndrome    Dyslipidemia    Hyperlipidemia    Ischemic heart disease    Plantar fasciitis    Seizure disorder ([place])    Vitamin D deficiency      Health Maintenance  Topic Date Due   Zoster Vaccines- Shingrix (1 of 2) Never done   INFLUENZA VACCINE  12/20/2020   COVID-19 Vaccine (5 - Booster for [place] series) 12/25/2020   COLONOSCOPY (Pts 45-53yr Insurance coverage will need to be confirmed)  09/10/2021   TETANUS/TDAP  07/16/2029   Hepatitis C Screening  Completed   HPV VACCINES  Aged Out  PNA vac Low Risk Adult  Discontinued     Immunization History  Administered Date(s) Administered   Influenza, High Dose  03/28/2018, 02/27/2019, 05/04/2020   Moderna SARS-COV2 Booster Vacc 08/25/2020   PFIZER SARS-COV-2 Vacc 06/26/2019, 08/04/2019, 02/26/2020   Pneumococcal-13 12/17/2015   Pneumococcal -23 04/22/2009   Td 04/22/2009   Tdap 07/17/2019   Zoster, Live 09/09/2009    Last Colon - 09/11/2018 - Dr Benson [place] - recc f/u Colon -   Past Surgical History:  Procedure Laterality Date   CHOLECYSTECTOMY     CORONARY ARTERY BYPASS GRAFT  08/2006   HAND SURGERY       Family History  Problem Relation Age of Onset   Hypertension Mother    Stroke Mother    Diabetes Father    Lupus Sister     Social History   Socioeconomic History   Marital status: Married    Spouse name: Vaughan Basta   Number  of children: 2  Occupational History   Occupation: Licensed Therapist, music / Retired  Tobacco Use   Smoking status: Former    Types: Cigarettes    Quit date: 05/22/1968    Years since quitting: 52.6   Smokeless tobacco: Never  Substance and Sexual Activity   Alcohol use: No    Alcohol/week: 0.0 standard drinks    Comment: very rarely   Drug use: No   Sexual activity: Not on file      ROS Constitutional: Denies fever, chills, weight loss/gain, headaches, insomnia,  night sweats or change in appetite. Does c/o fatigue. Eyes: Denies redness, blurred vision, diplopia, discharge, itchy or watery eyes.  ENT: Denies discharge, congestion, post nasal drip, epistaxis, sore throat, earache, hearing loss, dental pain, Tinnitus, Vertigo, Sinus pain or snoring.  Cardio: Denies chest pain, palpitations, irregular heartbeat, syncope, dyspnea, diaphoresis, orthopnea, PND, claudication or edema Respiratory: denies cough, dyspnea, DOE, pleurisy, hoarseness, laryngitis or wheezing.  Gastrointestinal: Denies dysphagia, heartburn, reflux, water brash, pain, cramps, nausea, vomiting, bloating, diarrhea, constipation, hematemesis, melena, hematochezia, jaundice or hemorrhoids Genitourinary: Denies dysuria, frequency, urgency, nocturia, hesitancy, discharge, hematuria or flank pain Musculoskeletal: Denies arthralgia, myalgia, stiffness, Jt. Swelling, pain, limp or strain/sprain. Denies Falls. Skin: Denies puritis, rash, hives, warts, acne, eczema or change in skin lesion Neuro: No weakness, tremor, incoordination, spasms, paresthesia or pain Psychiatric: Denies confusion, memory loss or sensory loss. Denies Depression. Endocrine: Denies change in weight, skin, hair change, nocturia, and paresthesia, diabetic polys, visual blurring or hyper / hypo glycemic episodes.  Heme/Lymph: No excessive bleeding, bruising or enlarged lymph nodes.   Physical Exam  BP 136/74   Pulse (!) 47   Temp 97.9 F (36.6 C)   Resp 12    Ht 5' 7.5" (1.715 m)   Wt 206 lb (93.4 kg)   SpO2 98%   BMI 31.79 kg/m   General Appearance: Well nourished and well groomed and in no apparent distress.  Eyes: PERRLA, EOMs, conjunctiva no swelling or erythema, normal fundi and vessels. Sinuses: No frontal/maxillary tenderness ENT/Mouth: EACs patent / TMs  nl. Nares clear without erythema, swelling, mucoid exudates. Oral hygiene is good. No erythema, swelling, or exudate. Tongue normal, non-obstructing. Tonsils not swollen or erythematous. Hearing normal.  Neck: Supple, thyroid not palpable. No bruits, nodes or JVD. Respiratory: Respiratory effort normal.  BS equal and clear bilateral without rales, rhonci, wheezing or stridor. Cardio: Heart sounds are normal with regular rate and rhythm and no murmurs, rubs or gallops. Peripheral pulses are normal and equal bilaterally without edema. No aortic or femoral  bruits. Chest: symmetric with normal excursions and percussion.  Abdomen: Soft, with Nl bowel sounds. Nontender, no guarding, rebound, hernias, masses, or organomegaly.  Lymphatics: Non tender without lymphadenopathy.  Musculoskeletal: Full ROM all peripheral extremities, joint stability, 5/5 strength, and normal gait. Skin: Warm and dry without rashes, lesions, cyanosis, clubbing or  ecchymosis.  Neuro: Cranial nerves intact, reflexes equal bilaterally. Normal muscle tone, no cerebellar symptoms. Sensation intact.  Pysch: Alert and oriented X 3 with normal affect, insight and judgment appropriate.   Assessment and Plan  1. Annual Preventative/Screening Exam    2. Essential hypertension  - EKG 12-Lead - [place], RETROPERITNL ABD,  LTD - Urinalysis, Routine w reflex microscopic - Microalbumin / creatinine urine ratio - CBC with Differential/Platelet - COMPLETE METABOLIC PANEL WITH GFR - Magnesium - TSH  3. Hyperlipidemia, mixed  - Lipid panel - TSH  4. Abnormal glucose  - Hemoglobin A1c - Insulin, random  5. Vitamin D  deficiency  - VITAMIN D 25 Hydroxy   6. Seizure disorder ([place])   7. BPH with obstruction/lower urinary tract symptoms  - PSA  8. Coronary artery disease involving coronary bypass  graft of native heart without angina pectoris  - EKG 12-Lead - Lipid panel  9. Screening for colorectal cancer  - POC Hemoccult Bld/Stl   10. Prostate cancer screening  - PSA  11. Screening for ischemic heart disease  - EKG 12-Lead  12. FH: hypertension  - EKG 12-Lead - [place], RETROPERITNL ABD,  LTD  13. Former smoker  - EKG 12-Lead - [place], RETROPERITNL ABD,  LTD  14. Screening for AAA (aortic abdominal aneurysm)  - [place], RETROPERITNL ABD,  LTD  15. Medication management  - CBC with Differential/Platelet - COMPLETE METABOLIC PANEL WITH GFR - Magnesium - Lipid panel - TSH - Hemoglobin A1c - Insulin, random - VITAMIN D 25 Hydroxy          Patient was counseled in prudent diet, weight control to achieve/maintain BMI less than 25, BP monitoring, regular exercise and medications as discussed.  Discussed med effects and SE's. Routine screening labs and tests as requested with regular follow-up as recommended. Over 40 minutes of exam, counseling, chart review and high complex critical decision making was performed   Kirtland Bouchard, MD

## 2020-12-18 LAB — CARBAMAZEPINE LEVEL, TOTAL: Carbamazepine Lvl: 5.2 mg/L (ref 4.0–12.0)

## 2020-12-18 NOTE — Progress Notes (Signed)
============================================================ -   Test results slightly outside the reference range are not unusual. If there is anything important, I will review this with you,  otherwise it is considered normal test values.  If you have further questions,  please do not hesitate to contact me at the office or via My Chart.  ============================================================ ============================================================  -  Carbatrol Level - OK - Keep dose same   ============================================================ ============================================================  - PSA - Low - Great ! ============================================================ ============================================================  -  Magnesium  -   1.8   -  very  low- goal is betw 2.0 - 2.5,   - So..............Marland Kitchen  Recommend that you take  Magnesium 500 mg tablet daily   - also important to eat lots of  leafy green vegetables   - spinach - Kale - collards - greens - okra - asparagus  - broccoli - quinoa - squash - almonds   - black, red, white beans  -  peas - green beans ============================================================ ============================================================  - Total Chol = 154    &  LDL Chol = 82 - Both  Excellent   - Very low risk for Heart Attack  / Stroke ============================================================ ============================================================  -  A1c = 5.7%  Blood sugar and A1c are  STILL elevated in the borderline and  early or pre-diabetes range which has the same   300% increased risk for heart attack, stroke, cancer and   alzheimer- type vascular dementia as full blown diabetes.   But the good news is that diet, exercise with  weight loss can cure the early diabetes at this  point. ============================================================ ============================================================  - Being diabetic has a  300% increased risk for heart attack,  stroke, cancer, and alzheimer- type vascular dementia.   - It is very important that you work harder with diet by  avoiding all foods that are white except chicken,   fish & calliflower.  - Avoid white rice  (brown & wild rice is OK),   - Avoid white potatoes  (sweet potatoes in moderation is OK),   White bread or wheat bread or anything made out of   white flour like bagels, donuts, rolls, buns, biscuits, cakes,  - pastries, cookies, pizza crust, and pasta (made from  white flour & egg whites)   - vegetarian pasta or spinach or wheat pasta is OK.  - Multigrain breads like Arnold's, Pepperidge Farm or   multigrain sandwich thins or high fiber breads like   [place] bread or "Dave's Killer" breads that are  4 to 5 grams fiber per slice !  are best.    Diet, exercise and weight loss can reverse and cure  diabetes in the early stages.   ============================================================ ============================================================  - Vitamin D = 81 - Excellent  ============================================================ ============================================================  - All Else - CBC - Kidneys - Electrolytes - Liver - Magnesium & Thyroid    - all  Normal / OK ============================================================ ============================================================

## 2020-12-20 LAB — CBC WITH DIFFERENTIAL/PLATELET
Absolute Monocytes: 389 cells/uL (ref 200–950)
Basophils Absolute: 19 cells/uL (ref 0–200)
Basophils Relative: 0.5 %
Eosinophils Absolute: 70 cells/uL (ref 15–500)
Eosinophils Relative: 1.9 %
HCT: 43.5 % (ref 38.5–50.0)
Hemoglobin: 14.6 g/dL (ref 13.2–17.1)
Lymphs Abs: 1203 cells/uL (ref 850–3900)
MCH: 31.1 pg (ref 27.0–33.0)
MCHC: 33.6 g/dL (ref 32.0–36.0)
MCV: 92.8 fL (ref 80.0–100.0)
MPV: 10 fL (ref 7.5–12.5)
Monocytes Relative: 10.5 %
Neutro Abs: 2020 cells/uL (ref 1500–7800)
Neutrophils Relative %: 54.6 %
Platelets: 162 10*3/uL (ref 140–400)
RBC: 4.69 10*6/uL (ref 4.20–5.80)
RDW: 12.6 % (ref 11.0–15.0)
Total Lymphocyte: 32.5 %
WBC: 3.7 10*3/uL — ABNORMAL LOW (ref 3.8–10.8)

## 2020-12-20 LAB — URINALYSIS, ROUTINE W REFLEX MICROSCOPIC
Bilirubin Urine: NEGATIVE
Glucose, UA: NEGATIVE
Hgb urine dipstick: NEGATIVE
Ketones, ur: NEGATIVE
Leukocytes,Ua: NEGATIVE
Nitrite: NEGATIVE
Protein, ur: NEGATIVE
Specific Gravity, Urine: 1.018 (ref 1.001–1.035)
pH: 6 (ref 5.0–8.0)

## 2020-12-20 LAB — MAGNESIUM: Magnesium: 1.8 mg/dL (ref 1.5–2.5)

## 2020-12-20 LAB — COMPLETE METABOLIC PANEL WITH GFR
AG Ratio: 1.8 (calc) (ref 1.0–2.5)
ALT: 17 U/L (ref 9–46)
AST: 22 U/L (ref 10–35)
Albumin: 4.3 g/dL (ref 3.6–5.1)
Alkaline phosphatase (APISO): 56 U/L (ref 35–144)
BUN: 25 mg/dL (ref 7–25)
CO2: 27 mmol/L (ref 20–32)
Calcium: 9.4 mg/dL (ref 8.6–10.3)
Chloride: 101 mmol/L (ref 98–110)
Creat: 1.02 mg/dL (ref 0.70–1.28)
Globulin: 2.4 g/dL (calc) (ref 1.9–3.7)
Glucose, Bld: 107 mg/dL — ABNORMAL HIGH (ref 65–99)
Potassium: 4 mmol/L (ref 3.5–5.3)
Sodium: 137 mmol/L (ref 135–146)
Total Bilirubin: 0.5 mg/dL (ref 0.2–1.2)
Total Protein: 6.7 g/dL (ref 6.1–8.1)
eGFR: 78 mL/min/{1.73_m2} (ref >=60)

## 2020-12-20 LAB — INSULIN, RANDOM: Insulin: 13 u[IU]/mL

## 2020-12-20 LAB — TSH: TSH: 4.42 mIU/L (ref 0.40–4.50)

## 2020-12-20 LAB — MICROALBUMIN / CREATININE URINE RATIO
Creatinine, Urine: 100 mg/dL (ref 20–320)
Microalb, Ur: <0.2 mg/dL

## 2020-12-20 LAB — VITAMIN D 25 HYDROXY (VIT D DEFICIENCY, FRACTURES): Vit D, 25-Hydroxy: 81 ng/mL (ref 30–100)

## 2020-12-20 LAB — HEMOGLOBIN A1C
Hgb A1c MFr Bld: 5.7 % of total Hgb — ABNORMAL HIGH (ref <5.7)
Mean Plasma Glucose: 117 mg/dL
eAG (mmol/L): 6.5 mmol/L

## 2020-12-20 LAB — LIPID PANEL
Cholesterol: 154 mg/dL (ref <200)
HDL: 46 mg/dL (ref >=40)
LDL Cholesterol (Calc): 82 mg/dL (calc)
Non-HDL Cholesterol (Calc): 108 mg/dL (calc) (ref <130)
Total CHOL/HDL Ratio: 3.3 (calc) (ref <5.0)
Triglycerides: 160 mg/dL — ABNORMAL HIGH (ref <150)

## 2020-12-20 LAB — PSA: PSA: 0.35 ng/mL (ref <=4.00)

## 2021-01-13 ENCOUNTER — Other Ambulatory Visit: Payer: Self-pay | Admitting: Internal Medicine

## 2021-01-26 ENCOUNTER — Other Ambulatory Visit: Payer: Self-pay

## 2021-01-26 MED ORDER — ROSUVASTATIN CALCIUM 20 MG PO TABS: ORAL_TABLET | ORAL | 3 refills | Status: DC

## 2021-02-04 ENCOUNTER — Ambulatory Visit: Payer: Medicare Other | Admitting: Adult Health

## 2021-04-07 ENCOUNTER — Other Ambulatory Visit: Payer: Self-pay | Admitting: Adult Health

## 2021-04-07 DIAGNOSIS — G40909 Epilepsy, unspecified, not intractable, without status epilepticus: Secondary | ICD-10-CM

## 2021-06-08 ENCOUNTER — Other Ambulatory Visit: Payer: Self-pay | Admitting: Adult Health Nurse Practitioner

## 2021-06-08 DIAGNOSIS — I10 Essential (primary) hypertension: Secondary | ICD-10-CM

## 2021-06-22 ENCOUNTER — Ambulatory Visit: Payer: Medicare Other | Admitting: Nurse Practitioner

## 2021-07-21 ENCOUNTER — Other Ambulatory Visit: Payer: Self-pay

## 2021-07-21 ENCOUNTER — Ambulatory Visit (INDEPENDENT_AMBULATORY_CARE_PROVIDER_SITE_OTHER): Payer: Medicare Other | Admitting: Nurse Practitioner

## 2021-07-21 ENCOUNTER — Encounter: Payer: Self-pay | Admitting: Nurse Practitioner

## 2021-07-21 VITALS — BP 124/80 | HR 48 | Temp 97.3°F | Wt 205.4 lb

## 2021-07-21 DIAGNOSIS — G40909 Epilepsy, unspecified, not intractable, without status epilepticus: Secondary | ICD-10-CM | POA: Diagnosis not present

## 2021-07-21 DIAGNOSIS — M5432 Sciatica, left side: Secondary | ICD-10-CM | POA: Diagnosis not present

## 2021-07-21 DIAGNOSIS — I10 Essential (primary) hypertension: Secondary | ICD-10-CM

## 2021-07-21 DIAGNOSIS — E559 Vitamin D deficiency, unspecified: Secondary | ICD-10-CM | POA: Diagnosis not present

## 2021-07-21 DIAGNOSIS — E782 Mixed hyperlipidemia: Secondary | ICD-10-CM | POA: Diagnosis not present

## 2021-07-21 DIAGNOSIS — R7309 Other abnormal glucose: Secondary | ICD-10-CM | POA: Diagnosis not present

## 2021-07-21 DIAGNOSIS — Z79899 Other long term (current) drug therapy: Secondary | ICD-10-CM

## 2021-07-21 DIAGNOSIS — E669 Obesity, unspecified: Secondary | ICD-10-CM

## 2021-07-21 DIAGNOSIS — I2510 Atherosclerotic heart disease of native coronary artery without angina pectoris: Secondary | ICD-10-CM

## 2021-07-21 NOTE — Patient Instructions (Signed)
Sciatica Rehab ?Ask your health care provider which exercises are safe for you. Do exercises exactly as told by your health care provider and adjust them as directed. It is normal to feel mild stretching, pulling, tightness, or discomfort as you do these exercises. Stop right away if you feel sudden pain or your pain gets worse. Do not begin these exercises until told by your health care provider. ?Stretching and range-of-motion exercises ?These exercises warm up your muscles and joints and improve the movement and flexibility of your hips and back. These exercises also help to relieve pain, numbness, and tingling. ?Sciatic nerve glide ?Sit in a chair with your head facing down toward your chest. Place your hands behind your back. Let your shoulders slump forward. ?Slowly straighten one of your legs while you tilt your head back as if you are looking toward the ceiling. Only straighten your leg as far as you can without making your symptoms worse. ?Hold this position for _____5_____ seconds. ?Slowly return to the starting position. ?Repeat with your other leg. ?Repeat _____3_____ times. Complete this exercise ______2____ times a day. ?Knee to chest with hip adduction and internal rotation ? ?Lie on your back on a firm surface with both legs straight. ?Bend one of your knees and move it up toward your chest until you feel a gentle stretch in your lower back and buttock. Then, move your knee toward the shoulder that is on the opposite side from your leg. This is hip adduction and internal rotation. ?Hold your leg in this position by holding on to the front of your knee. ?Hold this position for ______5____ seconds. ?Slowly return to the starting position. ?Repeat with your other leg. ?Repeat ____3______ times. Complete this exercise ____2______ times a day. ?Prone extension on elbows ? ?Lie on your abdomen on a firm surface. A bed may be too soft for this exercise. ?Prop yourself up on your elbows. ?Use your arms to  help lift your chest up until you feel a gentle stretch in your abdomen and your lower back. ?This will place some of your body weight on your elbows. If this is uncomfortable, try stacking pillows under your chest. ?Your hips should stay down, against the surface that you are lying on. Keep your hip and back muscles relaxed. ?Hold this position for _______5___ seconds. ?Slowly relax your upper body and return to the starting position. ?Repeat ______3____ times. Complete this exercise ___2_______ times a day. ?Strengthening exercises ?These exercises build strength and endurance in your back. Endurance is the ability to use your muscles for a long time, even after they get tired. ?Pelvic tilt ?This exercise strengthens the muscles that lie deep in the abdomen. ?Lie on your back on a firm surface. Bend your knees and keep your feet flat on the floor. ?Tense your abdominal muscles. Tip your pelvis up toward the ceiling and flatten your lower back into the floor. ?To help with this exercise, you may place a small towel under your lower back and try to push your back into the towel. ?Hold this position for ____5______ seconds. ?Let your muscles relax completely before you repeat this exercise. ?Repeat ______3____ times. Complete this exercise ______2____ times a day. ?Alternating arm and leg raises ? ?Get on your hands and knees on a firm surface. If you are on a hard floor, you may want to use padding, such as an exercise mat, to cushion your knees. ?Line up your arms and legs. Your hands should be directly below your shoulders,  and your knees should be directly below your hips. ?Lift your left leg behind you. At the same time, raise your right arm and straighten it in front of you. ?Do not lift your leg higher than your hip. ?Do not lift your arm higher than your shoulder. ?Keep your abdominal and back muscles tight. ?Keep your hips facing the ground. ?Do not arch your back. ?Keep your balance carefully, and do not  hold your breath. ?Hold this position for ____5______ seconds. ?Slowly return to the starting position. ?Repeat with your right leg and your left arm. ?Repeat ________3__ times. Complete this exercise ____2______ times a day. ?Posture and body mechanics ?Good posture and healthy body mechanics can help to relieve stress in your body's tissues and joints. Body mechanics refers to the movements and positions of your body while you do your daily activities. Posture is part of body mechanics. Good posture means: ?Your spine is in its natural S-curve position (neutral). ?Your shoulders are pulled back slightly. ?Your head is not tipped forward. ?Follow these guidelines to improve your posture and body mechanics in your everyday activities. ?Standing ? ?When standing, keep your spine neutral and your feet about hip width apart. Keep a slight bend in your knees. Your ears, shoulders, and hips should line up. ?When you do a task in which you stand in one place for a long time, place one foot up on a stable object that is 2-4 inches (5-10 cm) high, such as a footstool. This helps keep your spine neutral. ?Sitting ? ?When sitting, keep your spine neutral and keep your feet flat on the floor. Use a footrest, if necessary, and keep your thighs parallel to the floor. Avoid rounding your shoulders, and avoid tilting your head forward. ?When working at a desk or a computer, keep your desk at a height where your hands are slightly lower than your elbows. Slide your chair under your desk so you are close enough to maintain good posture. ?When working at a computer, place your monitor at a height where you are looking straight ahead and you do not have to tilt your head forward or downward to look at the screen. ?Resting ?When lying down and resting, avoid positions that are most painful for you. ?If you have pain with activities such as sitting, bending, stooping, or squatting, lie in a position in which your body does not bend very  much. For example, avoid curling up on your side with your arms and knees near your chest (fetal position). ?If you have pain with activities such as standing for a long time or reaching with your arms, lie with your spine in a neutral position and bend your knees slightly. Try the following positions: ?Lying on your side with a pillow between your knees. ?Lying on your back with a pillow under your knees. ?Lifting ? ?When lifting objects, keep your feet at least shoulder width apart and tighten your abdominal muscles. ?Bend your knees and hips and keep your spine neutral. It is important to lift using the strength of your legs, not your back. Do not lock your knees straight out. ?Always ask for help to lift heavy or awkward objects. ?This information is not intended to replace advice given to you by your health care provider. Make sure you discuss any questions you have with your health care provider. ?Document Revised: 08/30/2018 Document Reviewed: 05/30/2018 ?Elsevier Patient Education ? [place]. ? ?

## 2021-07-21 NOTE — Progress Notes (Signed)
6 MONTH FOLLOW UP Assessment:   Diagnoses and all orders for this visit:   Essential hypertension Continue medications - reminded to take medications consistently Elevated today, but forgot to take medication this AM Monitor blood pressure at home; call if consistently over 130/80 Continue DASH diet.   Reminder to go to the ER if any CP, SOB, nausea, dizziness, severe HA, changes vision/speech, left arm numbness and tingling and jaw pain.  Coronary artery disease without angina pectoris, unspecified vessel or lesion type, unspecified whether native or transplanted heart Control blood pressure, cholesterol, glucose, increase exercise.  Lost to follow up with cardiology, had normal myoview in in 2013, he is very active with no angina or other concerns, medical risk management continued through this office, will refer back if needed  Seizure disorder (HCC) Well controlled by carbamazepine with last seizure remote Continue medication, check levels as needed  Vitamin D deficiency At goal at recent check; continue to recommend supplementation for goal of 70-100 Defer vitamin D level  Hx of prediabetes Recent A1Cs at goal Discussed diet/exercise, weight management  Check A1C; check CMP  Obesity (BMI 30.0-34.9) Long discussion about weight loss, diet, and exercise Recommended diet heavy in fruits and veggies and low in animal meats, cheeses, and dairy products, appropriate calorie intake Discussed appropriate weight for height Follow up at next visit Check TSH  Medication management Check CBC, CMP/GFR  Mixed hyperlipidemia Continue medications: crestor Continue low cholesterol diet and exercise.  Check lipid panel.   Left sided sciatica -Given PT exercises, if no improvement or symptoms worsen notify the office and will order imaging    Continue diet and meds as discussed. Further disposition pending results of labs. Discussed med's effects and SE's.   Over 30 minutes of  exam, counseling, chart review, and critical decision making was performed.   Future Appointments  Date Time Provider Department Center  12/19/2021 10:00 AM Geoffrey Cowboy, MD [facility] None      Subjective:  [name] is a 71 y.o. male who presents for Medicare Annual Wellness Visit and 3 month follow up for HTN, hyperlipidemia, prediabetes, and vitamin D Def.    He has been noticing a numbness on side of left thigh that has been present for 3 months.  Has not gotten worse or better.  Does not change with activity.  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 Last level 11/2020 was in range Has checked yearly at CPE.  His wife had foot surgery 06/21/21 and achilles tendon was effected.  He is having to do everything currently as she is non weight bearing.  He is seeing Dr. Luiz Blare as needed, shoulders improved after steroid injection, no surgery needed.   BMI is Body mass index is 31.7 kg/m., he has been working on diet and exercise, has been walking everyday. Very active in shop. Eating more fresh fruit. They have started to decrease meat in diet.  Wt Readings from Last 3 Encounters:  07/21/21 205 lb 6.4 oz (93.2 kg)  12/17/20 206 lb (93.4 kg)  08/24/20 208 lb (94.3 kg)   In 2008, patient underwent an emergent CABG and has done well since.  He had an ETT-myoview in 5/13 that was a normal study.   Est with Dr. Shirlee Latch, has not followed up with cardiology since 2016, denies concerning sx, declined referral back  His blood pressure has  been controlled at home (130s/70s), today their BP is BP: 124/80 0 BP Readings from Last 3 Encounters:  07/21/21 124/80  12/17/20 136/74  08/24/20 (!) 158/74    He does workout. He denies chest pain, shortness of breath, dizziness.   He is on cholesterol medication (crestor 20 mg daily) and denies myalgias. His  LDL cholesterol is not at goal. The cholesterol last visit was:   Lab Results  Component Value Date   CHOL 154 12/17/2020   HDL 46 12/17/2020   LDLCALC 82 12/17/2020   TRIG 160 (H) 12/17/2020   CHOLHDL 3.3 12/17/2020    He has been working on diet and exercise for hx of prediabetes (6.1% in 2016, 5.7% in 07/2018, predates 2017), and denies foot ulcerations, increased appetite, nausea, paresthesia of the feet, polydipsia, polyuria, visual disturbances, vomiting and weight loss. Last A1C in the office was:  Lab Results  Component Value Date   HGBA1C 5.7 (H) 12/17/2020   Last GFR Lab Results  Component Value Date   GFRNONAA 71 08/24/2020    Patient is on Vitamin D supplement and at goal at recent check:    Lab Results  Component Value Date   VD25OH 81 12/17/2020      Medication Review: Current Outpatient Medications on File Prior to Visit  Medication Sig Dispense Refill   aspirin 81 MG tablet Take 81 mg by mouth daily.     carbamazepine (CARBATROL) 300 MG 12 hr capsule TAKE 1 CAPSULE BY MOUTH  TWICE DAILY TO PREVENT  SEIZURES 180 capsule 3   Cholecalciferol (VITAMIN D PO) Take 10,000 Units by mouth daily.     fish oil-omega-3 fatty acids 1000 MG capsule Take 1 g by mouth daily.     LUTEIN PO Take by mouth daily.     Magnesium 250 MG TABS Take 250 mg by mouth daily.     metoprolol succinate (TOPROL-XL) 100 MG 24 hr tablet TAKE 1 TABLET BY MOUTH  DAILY FOR BLOOD PRESSURE 90 tablet 3   rosuvastatin (CRESTOR) 20 MG tablet Take 1 tablet Daily for Cholesterol 90 tablet 3   tadalafil (CIALIS) 20 MG tablet Take 1/2 to 1 tablet every 2 to 3 days as needed for XXXX 30 tablet 99   COVID-19 mRNA vaccine, Moderna, (MODERNA COVID-19 VACCINE) 100 MCG/0.5ML injection Inject into the muscle. 0.25 mL 0   No current facility-administered medications on file prior to visit.    Allergies: Allergies  Allergen Reactions   Lipitor [Atorvastatin]     myalgia   Niacin And Related Other (See  Comments)    Pt unaware of this intolerance (10/31/13)    Current Problems (verified) has Seizure disorder (HCC); Hyperlipidemia; Hypertension; CAD (coronary artery disease); Vitamin D deficiency; Medication management; Obesity (BMI 30.0-34.9); Other abnormal glucose (prediabetes); Encounter for Medicare annual wellness exam; History of colonic polyps; and Gastroesophageal reflux disease on their problem list.   Names of Other Physician/Practitioners you currently use: 1. [facility] here for primary care 2. Dr. Emily Filbert, eye doctor, last visit 2020 3. Dr. Lysbeth Penner, dentist, last visit 2021  Patient Care Team: Geoffrey Cowboy, MD as PCP - General (Internal Medicine) Laurey Morale, MD as Consulting Physician (Cardiology)  Surgical: He  has a past surgical history that includes Coronary artery bypass graft (08/2006); Cholecystectomy; and Hand surgery. Family His family history includes Diabetes in his father; Hypertension in his mother; Lupus in his sister; Stroke in his mother. Social history  He reports that he quit  smoking about 53 years ago. His smoking use included cigarettes. He has never used smokeless tobacco. He reports that he does not drink alcohol and does not use drugs.    Objective:   Today's Vitals   07/21/21 1035  BP: 124/80  Pulse: (!) 48  Temp: (!) 97.3 F (36.3 C)  SpO2: 97%  Weight: 205 lb 6.4 oz (93.2 kg)    Body mass index is 31.7 kg/m.  General appearance: alert, no distress, WD/WN, male HEENT: normocephalic, sclerae anicteric, TMs pearly, nares patent, no discharge or erythema, pharynx normal Oral cavity: MMM, no lesions Neck: supple, no lymphadenopathy, no thyromegaly, no masses Heart: RRR, normal S1, S2, no murmurs Lungs: CTA bilaterally, no wheezes, rhonchi, or rales Abdomen: +bs, soft, non tender, non distended, no masses, no hepatomegaly, no splenomegaly Musculoskeletal: nontender, no swelling, no obvious  deformity. No pain on palpation of left leg, 5/5 strength of lower extremitites Extremities: no edema, no cyanosis, no clubbing Pulses: 2+ symmetric, upper and lower extremities, normal cap refill Neurological: alert, oriented x 3, CN2-12 intact, strength normal upper extremities and lower extremities, sensation normal throughout, DTRs 2+ throughout, no cerebellar signs, gait normal Psychiatric: normal affect, behavior normal, pleasant      Revonda Humphrey, NP   07/21/2021

## 2021-07-22 LAB — COMPLETE METABOLIC PANEL WITH GFR
AG Ratio: 1.9 (calc) (ref 1.0–2.5)
ALT: 21 U/L (ref 9–46)
AST: 23 U/L (ref 10–35)
Albumin: 4.5 g/dL (ref 3.6–5.1)
Alkaline phosphatase (APISO): 61 U/L (ref 35–144)
BUN: 20 mg/dL (ref 7–25)
CO2: 29 mmol/L (ref 20–32)
Calcium: 9.4 mg/dL (ref 8.6–10.3)
Chloride: 103 mmol/L (ref 98–110)
Creat: 1.06 mg/dL (ref 0.70–1.28)
Globulin: 2.4 g/dL (calc) (ref 1.9–3.7)
Glucose, Bld: 104 mg/dL — ABNORMAL HIGH (ref 65–99)
Potassium: 4.5 mmol/L (ref 3.5–5.3)
Sodium: 139 mmol/L (ref 135–146)
Total Bilirubin: 0.5 mg/dL (ref 0.2–1.2)
Total Protein: 6.9 g/dL (ref 6.1–8.1)
eGFR: 74 mL/min/{1.73_m2} (ref >=60)

## 2021-07-22 LAB — CBC WITH DIFFERENTIAL/PLATELET
Absolute Monocytes: 380 cells/uL (ref 200–950)
Basophils Absolute: 20 cells/uL (ref 0–200)
Basophils Relative: 0.5 %
Eosinophils Absolute: 92 cells/uL (ref 15–500)
Eosinophils Relative: 2.3 %
HCT: 43.3 % (ref 38.5–50.0)
Hemoglobin: 14.7 g/dL (ref 13.2–17.1)
Lymphs Abs: 1344 cells/uL (ref 850–3900)
MCH: 31 pg (ref 27.0–33.0)
MCHC: 33.9 g/dL (ref 32.0–36.0)
MCV: 91.4 fL (ref 80.0–100.0)
MPV: 10 fL (ref 7.5–12.5)
Monocytes Relative: 9.5 %
Neutro Abs: 2164 cells/uL (ref 1500–7800)
Neutrophils Relative %: 54.1 %
Platelets: 150 10*3/uL (ref 140–400)
RBC: 4.74 10*6/uL (ref 4.20–5.80)
RDW: 12.4 % (ref 11.0–15.0)
Total Lymphocyte: 33.6 %
WBC: 4 10*3/uL (ref 3.8–10.8)

## 2021-07-22 LAB — LIPID PANEL
Cholesterol: 134 mg/dL (ref <200)
HDL: 42 mg/dL (ref >=40)
LDL Cholesterol (Calc): 71 mg/dL (calc)
Non-HDL Cholesterol (Calc): 92 mg/dL (calc) (ref <130)
Total CHOL/HDL Ratio: 3.2 (calc) (ref <5.0)
Triglycerides: 128 mg/dL (ref <150)

## 2021-07-22 LAB — TSH: TSH: 4.22 mIU/L (ref 0.40–4.50)

## 2021-07-22 LAB — HEMOGLOBIN A1C
Hgb A1c MFr Bld: 5.8 % of total Hgb — ABNORMAL HIGH (ref <5.7)
Mean Plasma Glucose: 120 mg/dL
eAG (mmol/L): 6.6 mmol/L

## 2021-08-16 ENCOUNTER — Encounter: Payer: Self-pay | Admitting: Nurse Practitioner

## 2021-08-16 ENCOUNTER — Ambulatory Visit (INDEPENDENT_AMBULATORY_CARE_PROVIDER_SITE_OTHER): Payer: Medicare Other | Admitting: Nurse Practitioner

## 2021-08-16 ENCOUNTER — Other Ambulatory Visit: Payer: Self-pay

## 2021-08-16 VITALS — BP 142/80 | HR 64 | Temp 97.9°F | Resp 17 | Ht 67.5 in | Wt 210.2 lb

## 2021-08-16 DIAGNOSIS — S30861A Insect bite (nonvenomous) of abdominal wall, initial encounter: Secondary | ICD-10-CM

## 2021-08-16 DIAGNOSIS — L299 Pruritus, unspecified: Secondary | ICD-10-CM | POA: Diagnosis not present

## 2021-08-16 DIAGNOSIS — L03818 Cellulitis of other sites: Secondary | ICD-10-CM | POA: Diagnosis not present

## 2021-08-16 DIAGNOSIS — W57XXXA Bitten or stung by nonvenomous insect and other nonvenomous arthropods, initial encounter: Secondary | ICD-10-CM

## 2021-08-16 MED ORDER — SULFAMETHOXAZOLE-TRIMETHOPRIM 400-80 MG PO TABS: 1.0000 | ORAL_TABLET | Freq: Two times a day (BID) | ORAL | 0 refills | Status: AC

## 2021-08-16 NOTE — Progress Notes (Signed)
Assessment and Plan: ? ?[name] was seen today for an episodic visit.  ? ?Diagnoses and all orders for this visit: ? ?1. Tick bite of abdominal wall, initial encounter/Cellulitis of other specified site/Pruritus ? ?Start Bactrim for tmt of cellulitis. ?Continue OTC anti-itch cream/hydrocortisone ?Continue to monitor for increase in fever, chills, N/V, rash, hives when eating meat products.  ?Discussed [place] tick/alpha gal allergy. ? ? ?- sulfamethoxazole-trimethoprim (BACTRIM) 400-80 MG tablet; Take 1 tablet by mouth 2 (two) times daily for 10 days.  Dispense: 20 tablet; Refill: 0 ? ?Further disposition pending results of labs. Discussed med's effects and SE's.   ?Over 30 minutes of exam, counseling, chart review, and critical decision making was performed.  ? ?Future Appointments  ?Date Time Provider [place]  ?12/19/2021 10:00 AM [name], MD [facility] None  ? ? ?------------------------------------------------------------------------------------------------------------------ ? ? ?HPI ?BP (!) 142/80   Pulse 64   Temp 97.9 ?F (36.6 ?C)   Resp 17   Ht 5' 7.5" (1.715 m)   Wt 210 lb 3.2 oz (95.3 kg)   SpO2 96%   BMI 32.44 kg/m?  ? ?71 y.o.male presents for evaluation of tick bite along the left flank area that occurred 4 days ago.  Wife was able to remove tick with tweezers with head intact.  Reports over the last 4 days he has noticed the area with increase redness and swelling.  He has placed Hydrocortisone 1% on the area with mild improvement.  Denies bulls eye rash or BUE/BLE rash, blister.   Denies fatigue, fever, chills.  ? ?Past Medical History:  ?Diagnosis Date  ? Bilateral carpal tunnel syndrome   ? Dyslipidemia   ? Hyperlipidemia   ? Ischemic heart disease   ? Plantar fasciitis   ? Seizure disorder ([place])   ? Vitamin D deficiency   ?  ? ?Allergies  ?Allergen Reactions  ? Lipitor [Atorvastatin]   ?  myalgia  ? Niacin And Related Other (See Comments)  ?  Pt unaware of this  intolerance (10/31/13)  ? ? ?Current Outpatient Medications on File Prior to Visit  ?Medication Sig  ? aspirin 81 MG tablet Take 81 mg by mouth daily.  ? carbamazepine (CARBATROL) 300 MG 12 hr capsule TAKE 1 CAPSULE BY MOUTH  TWICE DAILY TO PREVENT  SEIZURES  ? Cholecalciferol (VITAMIN D PO) Take 10,000 Units by mouth daily.  ? COVID-19 mRNA vaccine, Moderna, (MODERNA COVID-19 VACCINE) 100 MCG/0.5ML injection Inject into the muscle.  ? fish oil-omega-3 fatty acids 1000 MG capsule Take 1 g by mouth daily.  ? LUTEIN PO Take by mouth daily.  ? Magnesium 250 MG TABS Take 250 mg by mouth daily.  ? metoprolol succinate (TOPROL-XL) 100 MG 24 hr tablet TAKE 1 TABLET BY MOUTH  DAILY FOR BLOOD PRESSURE  ? rosuvastatin (CRESTOR) 20 MG tablet Take 1 tablet Daily for Cholesterol  ? tadalafil (CIALIS) 20 MG tablet Take 1/2 to 1 tablet every 2 to 3 days as needed for XXXX  ? ?No current facility-administered medications on file prior to visit.  ? ? ?ROS: all negative except what is noted in the HPI.  ? ?Physical Exam: ? ?BP (!) 142/80   Pulse 64   Temp 97.9 ?F (36.6 ?C)   Resp 17   Ht 5' 7.5" (1.715 m)   Wt 210 lb 3.2 oz (95.3 kg)   SpO2 96%   BMI 32.44 kg/m?  ? ?General Appearance: NAD.  Awake, conversant and cooperative. ?Eyes: PERRLA, EOMs intact.  Sclera white.  Conjunctiva without erythema. ?Sinuses: No frontal/maxillary tenderness.  No nasal discharge. Nares patent.  ?ENT/Mouth: Ext aud canals clear.  Bilateral TMs w/DOL and without erythema or bulging. Hearing intact.  Posterior pharynx without swelling or exudate.  Tonsils without swelling or erythema.  ?Neck: Supple.  No masses, nodules or thyromegaly. ?Respiratory: Effort is regular with non-labored breathing. Breath sounds are equal bilaterally without rales, rhonchi, wheezing or stridor.  ?Cardio: RRR with no MRGs. Brisk peripheral pulses without edema.  ?Abdomen: Active BS in all four quadrants.  Soft and non-tender without guarding, rebound tenderness, hernias  or masses. ?Lymphatics: Non tender without lymphadenopathy.  ?Musculoskeletal: Full ROM, 5/5 strength, normal ambulation.  No clubbing or cyanosis. ?Skin: Left flank area with approximately 3 cm red raised erythematous area.  Warm.  No central clearing.  Surrounding skin WNL. ?Neuro: CN II-XII grossly normal. Normal muscle tone without cerebellar symptoms and intact sensation.   ?Psych: AO X 3,  appropriate mood and affect, insight and judgment.  ?  ? ?Darrol Jump, NP ?3:23 PM ?[facility] ? ?

## 2021-08-17 ENCOUNTER — Encounter: Payer: Self-pay | Admitting: Nurse Practitioner

## 2021-10-08 IMAGING — DX DG CHEST 2V
2 series · 2 of 2 positions shown · non-contrast
Comparison: 09/01/2006

CLINICAL DATA: Three weeks of left-sided chest pressure

EXAM:
CHEST - 2 VIEW

[dg chest 2 view (1 of 2)]
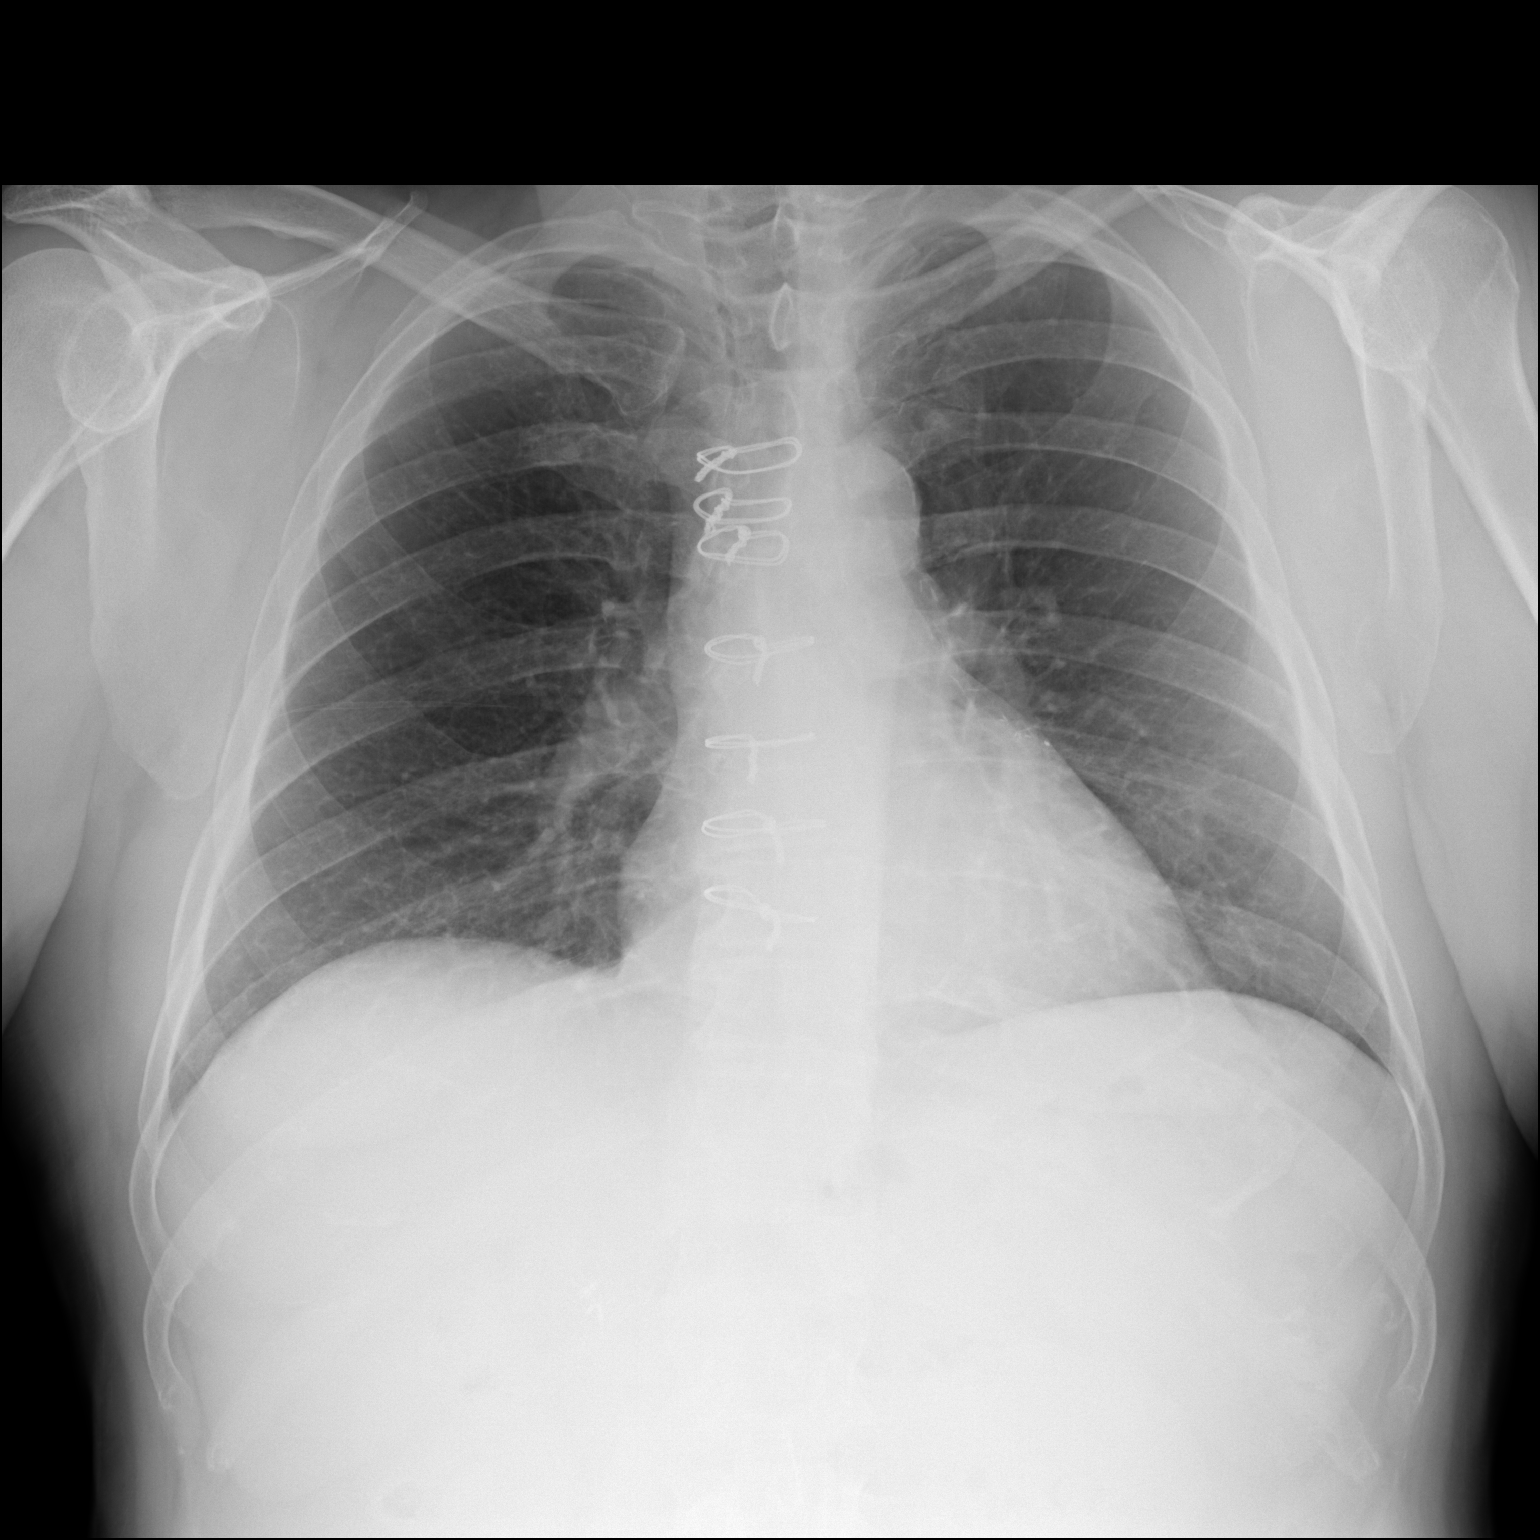

[dg chest 2 view (2 of 2)]
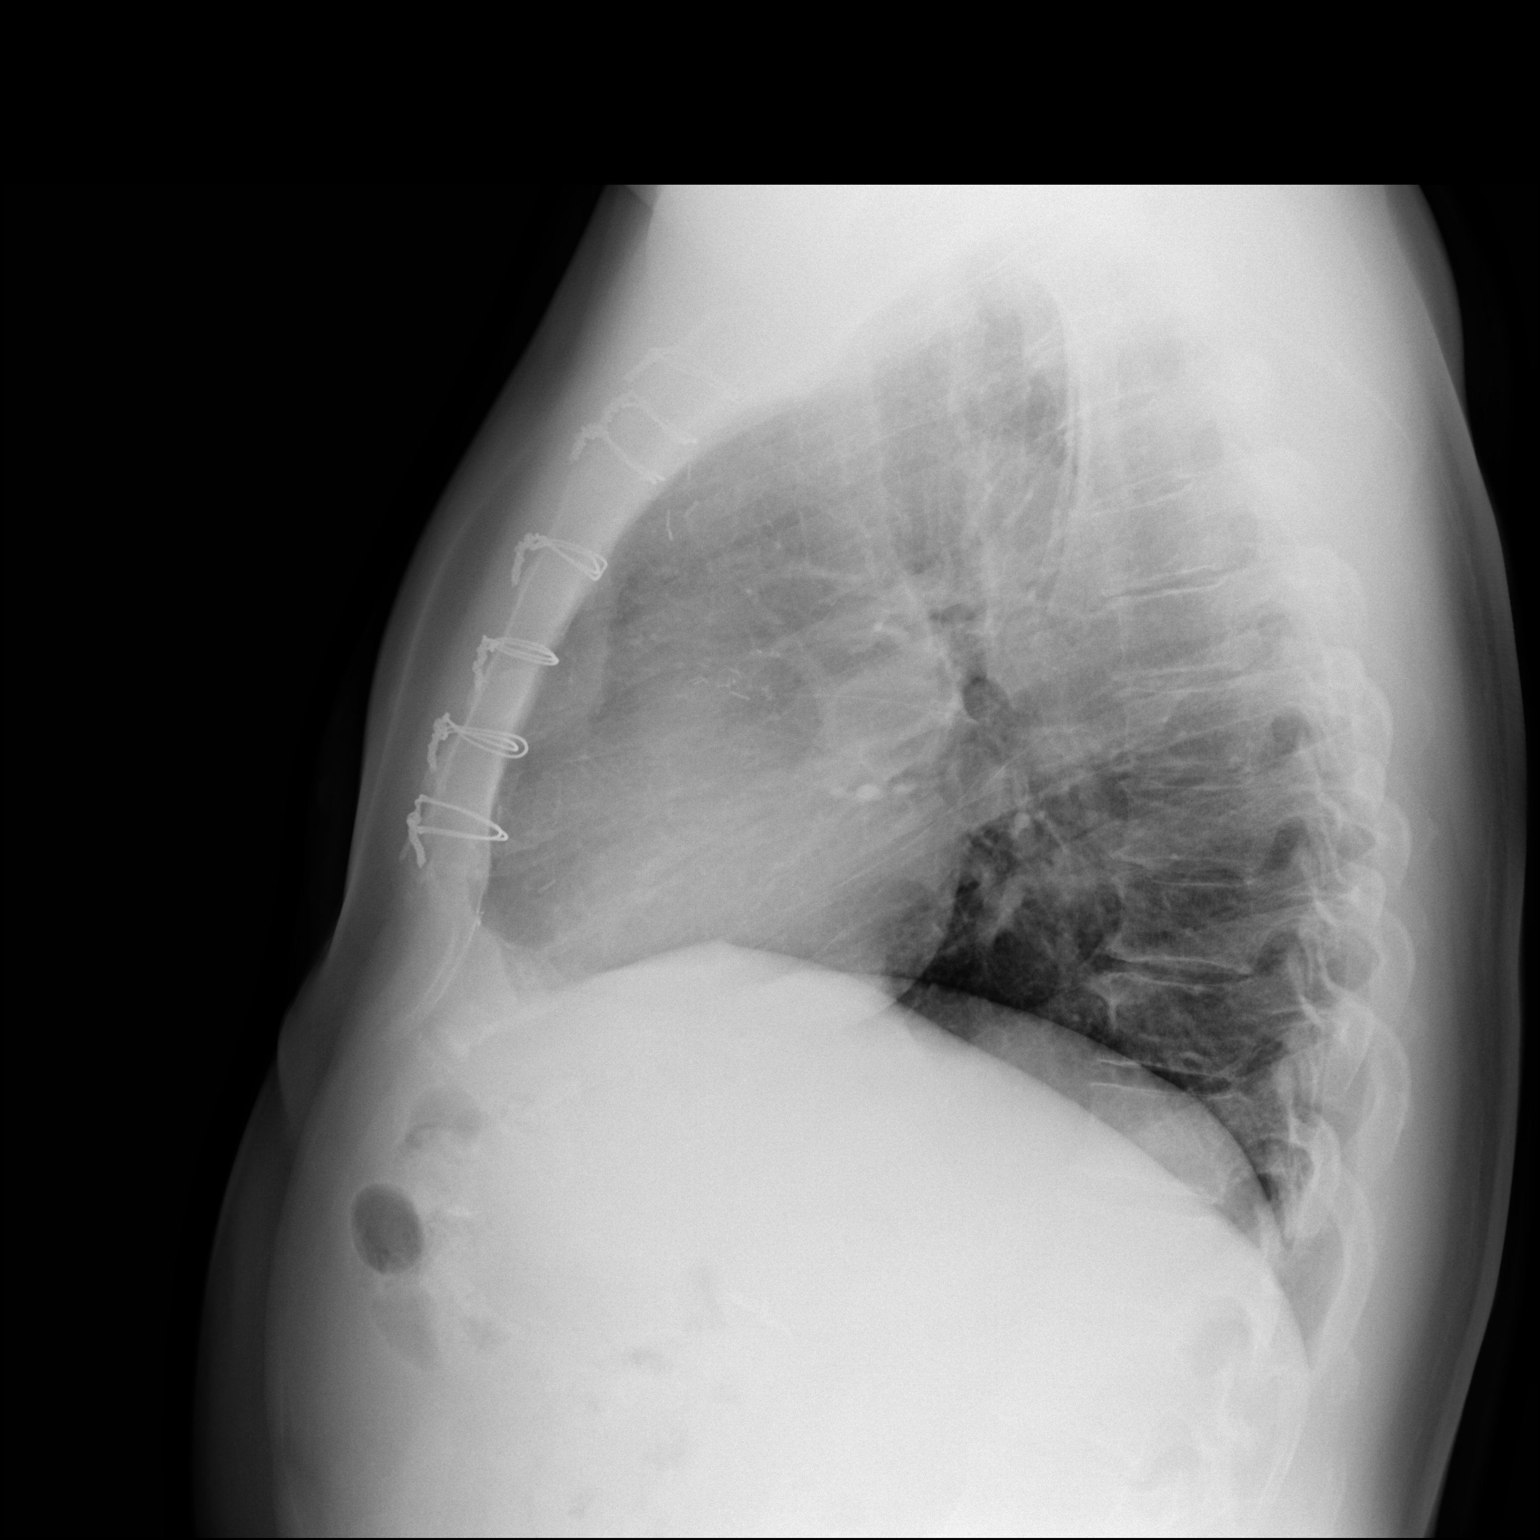

[2 of 2 positions shown; findings below may reference images not displayed]

FINDINGS: The patient is status post prior median sternotomy. The heart size
is normal. There is no pneumothorax. No large pleural effusion. No
focal infiltrate. There is no acute osseous abnormality.
IMPRESSION: No active cardiopulmonary disease.

## 2021-11-08 ENCOUNTER — Other Ambulatory Visit: Payer: Self-pay | Admitting: Adult Health

## 2021-12-18 ENCOUNTER — Encounter: Payer: Self-pay | Admitting: Internal Medicine

## 2021-12-18 NOTE — Patient Instructions (Signed)

## 2021-12-18 NOTE — Progress Notes (Unsigned)
Annual  Screening/Preventative Visit  & Comprehensive Evaluation & Examination   Future Appointments  Date Time Provider Department  12/19/2021                  cpe 10:00 AM [name], MD [facility]  12/22/2022                    cpe 10:00 AM [name], MD [facility]         This very nice 71 y.o. MWM presents for a Screening /Preventative Visit & comprehensive evaluation and management of multiple medical co-morbidities.  Patient has been followed for HTN, ASCAD/CABG, HLD, Prediabetes and Vitamin D Deficiency.  Patient has remote hx/o Seizures (1987) manifest as "Absence Seizures" & controlled on Carbamazepine (Carbatrol) .       HTN predates  since  2004. Patient's BP has been controlled and today's BP is elevated at  170/80 by nurse & rechecked x 2 at 188-190/76-78 .  Not checking BP's at home.  In 2008,  patient underwent emergent CABG and has done well since.  Patient denies any cardiac symptoms as chest pain, palpitations, shortness of breath, dizziness or ankle swelling.       Patient's hyperlipidemia is controlled with diet and Rosuvastatin.  Patient denies myalgias or other medication SE's. Last lipids were at goal :  Lab Results  Component Value Date   CHOL 134 07/21/2021   HDL 42 07/21/2021   LDLCALC 71 07/21/2021   TRIG 128 07/21/2021   CHOLHDL 3.2 07/21/2021         Patient has hx/o prediabetes ("A1c 5.7% /2014) and patient denies reactive hypoglycemic symptoms, visual blurring, diabetic polys or paresthesias. Last A1c was not at goal:   Lab Results  Component Value Date   HGBA1C 5.8 (H) 07/21/2021        Finally, patient has history of Vitamin D Deficiency ("24" / 2008) and last vitamin D was at goal:   Lab Results  Component Value Date   VD25OH 81 12/17/2020       Current Outpatient Medications:    aspirin 81 MG tablet, Take 81 mg by mouth daily., Disp: , Rfl:    Cholecalciferol (VITAMIN D PO), Take 10,000 Units by mouth daily., Disp: ,  Rfl:    fish oil-omega-3 fatty acids 1000 MG capsule, Take 1 g by mouth daily., Disp: , Rfl:    LUTEIN PO, Take by mouth daily., Disp: , Rfl:    Magnesium 250 MG TABS, Take 250 mg by mouth daily., Disp: , Rfl:    metoprolol succinate (TOPROL-XL) 100 MG 24 hr tablet, TAKE 1 TABLET BY MOUTH  DAILY FOR BLOOD PRESSURE, Disp: 90 tablet, Rfl: 3   rosuvastatin (CRESTOR) 20 MG tablet, TAKE 1 TABLET BY MOUTH  DAILY FOR CHOLESTEROL, Disp: 90 tablet, Rfl: 1   tadalafil (CIALIS) 20 MG tablet, Take 1/2 to 1 tablet every 2 to 3 days as needed for XXXX, Disp: 30 tablet, Rfl: 99   carbamazepine (CARBATROL) 300 MG 12 hr capsule, Take  1 capsule  Daily  to Prevent  ' Absence Seizures', Disp: 90 capsule, Rfl: 3   COVID-19 mRNA vaccine, Moderna, (MODERNA COVID-19 VACCINE) 100 MCG/0.5ML injection, Inject into the muscle. (Patient not taking: Reported on 12/19/2021), Disp: 0.25 mL, Rfl: 0    Allergies  Allergen Reactions   Lipitor [Atorvastatin]     myalgia   Niacin And Related Other (See Comments)    Pt unaware of this intolerance (10/31/13)  Past Medical History:  Diagnosis Date   Bilateral carpal tunnel syndrome    Dyslipidemia    Hyperlipidemia    Ischemic heart disease    Plantar fasciitis    Seizure disorder ([place])    Vitamin D deficiency      Health Maintenance  Topic Date Due   Zoster Vaccines- Shingrix (1 of 2) Never done   INFLUENZA VACCINE  12/20/2020   COVID-19 Vaccine (5 - Booster for [place] series) 12/25/2020   COLONOSCOPY (Pts 45-66yr Insurance coverage will need to be confirmed)  09/10/2021   TETANUS/TDAP  07/16/2029   Hepatitis C Screening  Completed   HPV VACCINES  Aged Out   PNA vac Low Risk Adult  Discontinued     Immunization History  Administered Date(s) Administered   Influenza, High Dose  03/28/2018, 02/27/2019, 05/04/2020   Moderna SARS-COV2 Booster Vacc 08/25/2020   PFIZER SARS-COV-2 Vacc 06/26/2019, 08/04/2019, 02/26/2020   Pneumococcal-13 12/17/2015    Pneumococcal -23 04/22/2009   Td 04/22/2009   Tdap 07/17/2019   Zoster, Live 09/09/2009    Last Colon - 09/11/2018 - Dr HBenson [place]- recc f/u Colon -   Past Surgical History:  Procedure Laterality Date   CHOLECYSTECTOMY     CORONARY ARTERY BYPASS GRAFT  08/2006   HAND SURGERY       Family History  Problem Relation Age of Onset   Hypertension Mother    Stroke Mother    Diabetes Father    Lupus Sister     Social History   Socioeconomic History   Marital status: Married    Spouse name: LVaughan Basta  Number of children: 2  Occupational History   Occupation: Licensed OTherapist, music( Retired )  Tobacco Use   Smoking status: Former    Types: Cigarettes    Quit date: 05/22/1968    Years since quitting: 52.6   Smokeless tobacco: Never  Substance and Sexual Activity   Alcohol use: No    Alcohol/week: 0.0 standard drinks    Comment: very rarely   Drug use: No   Sexual activity: Not on file      ROS Constitutional: Denies fever, chills, weight loss/gain, headaches, insomnia,  night sweats or change in appetite. Does c/o fatigue. Eyes: Denies redness, blurred vision, diplopia, discharge, itchy or watery eyes.  ENT: Denies discharge, congestion, post nasal drip, epistaxis, sore throat, earache, hearing loss, dental pain, Tinnitus, Vertigo, Sinus pain or snoring.  Cardio: Denies chest pain, palpitations, irregular heartbeat, syncope, dyspnea, diaphoresis, orthopnea, PND, claudication or edema Respiratory: denies cough, dyspnea, DOE, pleurisy, hoarseness, laryngitis or wheezing.  Gastrointestinal: Denies dysphagia, heartburn, reflux, water brash, pain, cramps, nausea, vomiting, bloating, diarrhea, constipation, hematemesis, melena, hematochezia, jaundice or hemorrhoids Genitourinary: Denies dysuria, frequency, urgency, nocturia, hesitancy, discharge, hematuria or flank pain Musculoskeletal: Denies arthralgia, myalgia, stiffness, Jt. Swelling, pain, limp or strain/sprain. Denies Falls. Skin:  Denies puritis, rash, hives, warts, acne, eczema or change in skin lesion Neuro: No weakness, tremor, incoordination, spasms, paresthesia or pain Psychiatric: Denies confusion, memory loss or sensory loss. Denies Depression. Endocrine: Denies change in weight, skin, hair change, nocturia, and paresthesia, diabetic polys, visual blurring or hyper / hypo glycemic episodes.  Heme/Lymph: No excessive bleeding, bruising or enlarged lymph nodes.   Physical Exam  BP (!) 170/80   Pulse (!) 43   Temp 97.9 F (36.6 C)   Resp 17   Ht 5' 7.5" (1.715 m)   Wt 207 lb 9.6 oz (94.2 kg)   SpO2 99%   BMI 32.03 kg/m  BP rechecked x 2 at rechecked x 2   at   188-190  /  76-78   General Appearance: Well nourished and well groomed and in no apparent distress.  Eyes: PERRLA, EOMs, conjunctiva no swelling or erythema, normal fundi and vessels. Sinuses: No frontal/maxillary tenderness ENT/Mouth: EACs patent / TMs  nl. Nares clear without erythema, swelling, mucoid exudates. Oral hygiene is good. No erythema, swelling, or exudate. Tongue normal, non-obstructing. Tonsils not swollen or erythematous. Hearing normal.  Neck: Supple, thyroid not palpable. No bruits, nodes or JVD. Respiratory: Respiratory effort normal.  BS equal and clear bilateral without rales, rhonci, wheezing or stridor. Cardio: Heart sounds are normal with regular rate and rhythm and no murmurs, rubs or gallops. Peripheral pulses are normal and equal bilaterally without edema. No aortic or femoral bruits. Chest: symmetric with normal excursions and percussion.  Abdomen: Soft, with Nl bowel sounds. Nontender, no guarding, rebound, hernias, masses, or organomegaly.  Lymphatics: Non tender without lymphadenopathy.  Musculoskeletal: Full ROM all peripheral extremities, joint stability, 5/5 strength, and normal gait. Skin: Warm and dry without rashes, lesions, cyanosis, clubbing or  ecchymosis.  Neuro: Cranial nerves intact, reflexes equal  bilaterally. Normal muscle tone, no cerebellar symptoms. Sensation intact.  Pysch: Alert and oriented X 3 with normal affect, insight and judgment appropriate.   Assessment and Plan  1. Annual Preventative/Screening Exam   2. Essential hypertension  - advised to have wife (retired Marine scientist) monitor bid BPs for 10-14 days & call in report   - EKG 12-Lead - [place], RETROPERITNL ABD,  LTD - Urinalysis, Routine w reflex microscopic - Microalbumin / creatinine urine ratio - CBC with Differential/Platelet - COMPLETE METABOLIC PANEL WITH GFR - Magnesium - TSH  3. Hyperlipidemia, mixed  - EKG 12-Lead - [place], RETROPERITNL ABD,  LTD - Lipid panel - TSH  4. Abnormal glucose  - EKG 12-Lead - [place], RETROPERITNL ABD,  LTD - Hemoglobin A1c - Insulin, random  5. Vitamin D deficiency  - VITAMIN D 25 Hydroxy   6. Seizure disorder (HCC)  - Carbamazepine level, total  7. BPH with obstruction/lower urinary tract symptoms  - PSA  8. Coronary artery disease involving coronary bypass  graft of native heart without angina pectoris  - EKG 12-Lead  9. Screening for colorectal cancer   10. Prostate cancer screening  - PSA  11. Screening for heart disease  - EKG 12-Lead  12. FH: hypertension  - EKG 12-Lead - [place], RETROPERITNL ABD,  LTD  13. Former smoker  - EKG 12-Lead - [place], RETROPERITNL ABD,  LTD  14. Screening for AAA (aortic abdominal aneurysm)  - [place], RETROPERITNL ABD,  LTD  15. Medication management  - Urinalysis, Routine w reflex microscopic - Microalbumin / creatinine urine ratio - CBC with Differential/Platelet - COMPLETE METABOLIC PANEL WITH GFR - Magnesium - Lipid panel - TSH - Hemoglobin A1c - Insulin, random - VITAMIN D 25 Hydroxy  - Carbamazepine level, total          Patient was counseled in prudent diet, weight control to achieve/maintain BMI less than 25, BP monitoring, regular exercise and medications as discussed.  Discussed med effects and SE's.  Routine screening labs and tests as requested with regular follow-up as recommended. Over 40 minutes of exam, counseling, chart review and high complex critical decision making was performed   Kirtland Bouchard, MD

## 2021-12-19 ENCOUNTER — Ambulatory Visit (INDEPENDENT_AMBULATORY_CARE_PROVIDER_SITE_OTHER): Payer: Medicare Other | Admitting: Internal Medicine

## 2021-12-19 ENCOUNTER — Encounter: Payer: Self-pay | Admitting: Internal Medicine

## 2021-12-19 VITALS — BP 170/80 | HR 43 | Temp 97.9°F | Resp 17 | Ht 67.5 in | Wt 207.6 lb

## 2021-12-19 DIAGNOSIS — Z125 Encounter for screening for malignant neoplasm of prostate: Secondary | ICD-10-CM

## 2021-12-19 DIAGNOSIS — Z87891 Personal history of nicotine dependence: Secondary | ICD-10-CM

## 2021-12-19 DIAGNOSIS — G40909 Epilepsy, unspecified, not intractable, without status epilepticus: Secondary | ICD-10-CM

## 2021-12-19 DIAGNOSIS — Z79899 Other long term (current) drug therapy: Secondary | ICD-10-CM | POA: Diagnosis not present

## 2021-12-19 DIAGNOSIS — I10 Essential (primary) hypertension: Secondary | ICD-10-CM | POA: Diagnosis not present

## 2021-12-19 DIAGNOSIS — Z0000 Encounter for general adult medical examination without abnormal findings: Secondary | ICD-10-CM

## 2021-12-19 DIAGNOSIS — E559 Vitamin D deficiency, unspecified: Secondary | ICD-10-CM

## 2021-12-19 DIAGNOSIS — Z0001 Encounter for general adult medical examination with abnormal findings: Secondary | ICD-10-CM

## 2021-12-19 DIAGNOSIS — I700 Atherosclerosis of aorta: Secondary | ICD-10-CM | POA: Diagnosis not present

## 2021-12-19 DIAGNOSIS — Z8249 Family history of ischemic heart disease and other diseases of the circulatory system: Secondary | ICD-10-CM

## 2021-12-19 DIAGNOSIS — Z1211 Encounter for screening for malignant neoplasm of colon: Secondary | ICD-10-CM

## 2021-12-19 DIAGNOSIS — R7309 Other abnormal glucose: Secondary | ICD-10-CM | POA: Diagnosis not present

## 2021-12-19 DIAGNOSIS — I25810 Atherosclerosis of coronary artery bypass graft(s) without angina pectoris: Secondary | ICD-10-CM

## 2021-12-19 DIAGNOSIS — Z136 Encounter for screening for cardiovascular disorders: Secondary | ICD-10-CM

## 2021-12-19 DIAGNOSIS — E782 Mixed hyperlipidemia: Secondary | ICD-10-CM

## 2021-12-19 DIAGNOSIS — N138 Other obstructive and reflux uropathy: Secondary | ICD-10-CM

## 2021-12-19 MED ORDER — CARBAMAZEPINE ER 300 MG PO CP12
ORAL_CAPSULE | ORAL | 3 refills | Status: DC
Start: 2021-12-19 — End: 2022-05-23

## 2021-12-20 LAB — CBC WITH DIFFERENTIAL/PLATELET
Absolute Monocytes: 333 cells/uL (ref 200–950)
Basophils Absolute: 19 cells/uL (ref 0–200)
Basophils Relative: 0.6 %
Eosinophils Absolute: 42 cells/uL (ref 15–500)
Eosinophils Relative: 1.3 %
HCT: 40.8 % (ref 38.5–50.0)
Hemoglobin: 14.3 g/dL (ref 13.2–17.1)
Lymphs Abs: 1034 cells/uL (ref 850–3900)
MCH: 32.1 pg (ref 27.0–33.0)
MCHC: 35 g/dL (ref 32.0–36.0)
MCV: 91.7 fL (ref 80.0–100.0)
MPV: 9.9 fL (ref 7.5–12.5)
Monocytes Relative: 10.4 %
Neutro Abs: 1773 cells/uL (ref 1500–7800)
Neutrophils Relative %: 55.4 %
Platelets: 146 10*3/uL (ref 140–400)
RBC: 4.45 10*6/uL (ref 4.20–5.80)
RDW: 12.3 % (ref 11.0–15.0)
Total Lymphocyte: 32.3 %
WBC: 3.2 10*3/uL — ABNORMAL LOW (ref 3.8–10.8)

## 2021-12-20 LAB — VITAMIN D 25 HYDROXY (VIT D DEFICIENCY, FRACTURES): Vit D, 25-Hydroxy: 80 ng/mL (ref 30–100)

## 2021-12-20 LAB — COMPLETE METABOLIC PANEL WITH GFR
AG Ratio: 1.9 (calc) (ref 1.0–2.5)
ALT: 15 U/L (ref 9–46)
AST: 17 U/L (ref 10–35)
Albumin: 4.2 g/dL (ref 3.6–5.1)
Alkaline phosphatase (APISO): 59 U/L (ref 35–144)
BUN: 21 mg/dL (ref 7–25)
CO2: 30 mmol/L (ref 20–32)
Calcium: 9.1 mg/dL (ref 8.6–10.3)
Chloride: 105 mmol/L (ref 98–110)
Creat: 1.03 mg/dL (ref 0.70–1.28)
Globulin: 2.2 g/dL (calc) (ref 1.9–3.7)
Glucose, Bld: 109 mg/dL — ABNORMAL HIGH (ref 65–99)
Potassium: 4.1 mmol/L (ref 3.5–5.3)
Sodium: 141 mmol/L (ref 135–146)
Total Bilirubin: 0.4 mg/dL (ref 0.2–1.2)
Total Protein: 6.4 g/dL (ref 6.1–8.1)
eGFR: 77 mL/min/{1.73_m2} (ref >=60)

## 2021-12-20 LAB — PSA: PSA: 0.33 ng/mL (ref <=4.00)

## 2021-12-20 LAB — INSULIN, RANDOM: Insulin: 18.2 u[IU]/mL

## 2021-12-20 LAB — LIPID PANEL
Cholesterol: 140 mg/dL (ref <200)
HDL: 47 mg/dL (ref >=40)
LDL Cholesterol (Calc): 70 mg/dL (calc)
Non-HDL Cholesterol (Calc): 93 mg/dL (calc) (ref <130)
Total CHOL/HDL Ratio: 3 (calc) (ref <5.0)
Triglycerides: 144 mg/dL (ref <150)

## 2021-12-20 LAB — MICROALBUMIN / CREATININE URINE RATIO
Creatinine, Urine: 139 mg/dL (ref 20–320)
Microalb, Ur: <0.2 mg/dL

## 2021-12-20 LAB — URINALYSIS, ROUTINE W REFLEX MICROSCOPIC
Bilirubin Urine: NEGATIVE
Glucose, UA: NEGATIVE
Hgb urine dipstick: NEGATIVE
Ketones, ur: NEGATIVE
Leukocytes,Ua: NEGATIVE
Nitrite: NEGATIVE
Protein, ur: NEGATIVE
Specific Gravity, Urine: 1.023 (ref 1.001–1.035)
pH: 6 (ref 5.0–8.0)

## 2021-12-20 LAB — HEMOGLOBIN A1C
Hgb A1c MFr Bld: 5.6 % of total Hgb (ref <5.7)
Mean Plasma Glucose: 114 mg/dL
eAG (mmol/L): 6.3 mmol/L

## 2021-12-20 LAB — MAGNESIUM: Magnesium: 1.8 mg/dL (ref 1.5–2.5)

## 2021-12-20 LAB — TSH: TSH: 4.62 mIU/L — ABNORMAL HIGH (ref 0.40–4.50)

## 2021-12-20 LAB — CARBAMAZEPINE LEVEL, TOTAL: Carbamazepine Lvl: 5.7 mg/L (ref 4.0–12.0)

## 2021-12-20 NOTE — Progress Notes (Signed)
<><><><><><><><><><><><><><><><><><><><><><><><><><><><><><><><><> <><><><><><><><><><><><><><><><><><><><><><><><><><><><><><><><><> -   Test results slightly outside the reference range are not unusual. If there is anything important, I will review this with you,  otherwise it is considered normal test values.  If you have further questions,  please do not hesitate to contact me at the office or via My Chart.  <><><><><><><><><><><><><><><><><><><><><><><><><><><><><><><><><> <><><><><><><><><><><><><><><><><><><><><><><><><><><><><><><><><>  -  TSH is slightly high which means Thyroid Hormone is slightly low in blood,   - BUT if patient is taking supplements with large amounts of Biotin,    it WILL cause false measurements for Thyroid & other blood results,   So please be sure not taking any Biotin supplements  - We will recheck this at next office visits. <><><><><><><><><><><><><><><><><><><><><><><><><><><><><><><><><> <><><><><><><><><><><><><><><><><><><><><><><><><><><><><><><><><>  -  PSA - very Low - Great ! <><><><><><><><><><><><><><><><><><><><><><><><><><><><><><><><><> <><><><><><><><><><><><><><><><><><><><><><><><><><><><><><><><><>  -   Magnesium  -  1.8  -  is STILL very  low- goal is betw 2.0 - 2.5,   - So..............Marland Kitchen  Recommend that you increase to                                                                  Magnesium 500 mg tablets x 2 tablets  /daily  - also important to eat lots of  leafy green vegetables   - spinach - Kale - collards - greens - okra - asparagus  - broccoli - quinoa - squash - almonds   - black, red, white beans  -  peas - green beans <><><><><><><><><><><><><><><><><><><><><><><><><><><><><><><><><> <><><><><><><><><><><><><><><><><><><><><><><><><><><><><><><><><>  -  Total Chol = 140    &   LDL Chol = 70   - Both  Excellent   - Very low risk for Heart Attack  /  Stroke <><><><><><><><><><><><><><><><><><><><><><><><><><><><><><><><><> <><><><><><><><><><><><><><><><><><><><><><><><><><><><><><><><><>  -  A1c back to 5.6% - Normal NonDiabetic range -   Great !  <><><><><><><><><><><><><><><><><><><><><><><><><><><><><><><><><> <><><><><><><><><><><><><><><><><><><><><><><><><><><><><><><><><>  -  Vitamin D = 80  - Excellent - Please keep dose same <><><><><><><><><><><><><><><><><><><><><><><><><><><><><><><><><> <><><><><><><><><><><><><><><><><><><><><><><><><><><><><><><><><>  -  [place] level is OK - Please keep dose same  <><><><><><><><><><><><><><><><><><><><><><><><><><><><><><><><><> <><><><><><><><><><><><><><><><><><><><><><><><><><><><><><><><><>  -  All Else - CBC - Kidneys - Electrolytes - Liver - Magnesium & Thyroid    - all  Normal / OK <><><><><><><><><><><><><><><><><><><><><><><><><><><><><><><><><> <><><><><><><><><><><><><><><><><><><><><><><><><><><><><><><><><>  -  Keep up the [place] Work  !  <><><><><><><><><><><><><><><><><><><><><><><><><><><><><><><><><> <><><><><><><><><><><><><><><><><><><><><><><><><><><><><><><><><>

## 2021-12-26 ENCOUNTER — Other Ambulatory Visit: Payer: Self-pay | Admitting: Internal Medicine

## 2021-12-26 DIAGNOSIS — N529 Male erectile dysfunction, unspecified: Secondary | ICD-10-CM

## 2021-12-26 MED ORDER — TADALAFIL 20 MG PO TABS: ORAL_TABLET | ORAL | 1 refills | Status: DC

## 2021-12-29 ENCOUNTER — Other Ambulatory Visit: Payer: Self-pay | Admitting: Internal Medicine

## 2021-12-29 DIAGNOSIS — N529 Male erectile dysfunction, unspecified: Secondary | ICD-10-CM

## 2021-12-29 MED ORDER — TADALAFIL 20 MG PO TABS: ORAL_TABLET | ORAL | 1 refills | Status: DC

## 2022-01-02 ENCOUNTER — Other Ambulatory Visit: Payer: Self-pay | Admitting: Internal Medicine

## 2022-01-02 DIAGNOSIS — N529 Male erectile dysfunction, unspecified: Secondary | ICD-10-CM

## 2022-01-02 MED ORDER — TADALAFIL 20 MG PO TABS: ORAL_TABLET | ORAL | 1 refills | Status: DC

## 2022-02-14 ENCOUNTER — Other Ambulatory Visit: Payer: Self-pay | Admitting: Nurse Practitioner

## 2022-02-14 ENCOUNTER — Other Ambulatory Visit: Payer: Self-pay

## 2022-02-14 DIAGNOSIS — I10 Essential (primary) hypertension: Secondary | ICD-10-CM

## 2022-02-14 MED ORDER — ROSUVASTATIN CALCIUM 20 MG PO TABS: ORAL_TABLET | ORAL | 1 refills | Status: DC

## 2022-02-21 DIAGNOSIS — M5441 Lumbago with sciatica, right side: Secondary | ICD-10-CM | POA: Diagnosis not present

## 2022-02-21 DIAGNOSIS — M5442 Lumbago with sciatica, left side: Secondary | ICD-10-CM | POA: Diagnosis not present

## 2022-03-09 DIAGNOSIS — H2513 Age-related nuclear cataract, bilateral: Secondary | ICD-10-CM | POA: Diagnosis not present

## 2022-03-14 DIAGNOSIS — Z0189 Encounter for other specified special examinations: Secondary | ICD-10-CM | POA: Diagnosis not present

## 2022-03-14 DIAGNOSIS — M5442 Lumbago with sciatica, left side: Secondary | ICD-10-CM | POA: Diagnosis not present

## 2022-03-17 DIAGNOSIS — M545 Low back pain, unspecified: Secondary | ICD-10-CM | POA: Diagnosis not present

## 2022-03-22 NOTE — Progress Notes (Unsigned)
MEDICARE ANNUAL WELLNESS VISIT AND FOLLOW UP Assessment:   Diagnoses and all orders for this visit:  Encounter for Medicare annual wellness exam Due annually   Essential hypertension Continue medications - reminded to take medications consistently Elevated today, typically well controlled with current agent Monitor blood pressure at home; call if consistently over 130/80 Continue DASH diet.   Reminder to go to the ER if any CP, SOB, nausea, dizziness, severe HA, changes vision/speech, left arm numbness and tingling and jaw pain.  Coronary artery disease without angina pectoris, unspecified vessel or lesion type, unspecified whether native or transplanted heart Control blood pressure, cholesterol, glucose, increase exercise.  Lost to follow up with cardiology, had normal myoview in in 2013, he is very active with no angina or other concerns, medical risk management continued through this office, will refer back if needed  Seizure disorder ([place]) Well controlled by carbamazepine with last seizure remote Continue medication, check levels as needed  Vitamin D deficiency At goal at recent check; continue to recommend supplementation for goal of 70-100 Defer vitamin D level  Hx of prediabetes Recent A1Cs at goal Discussed diet/exercise, weight management  Defer A1C; check CMP  Obesity (BMI 30.0-34.9) Long discussion about weight loss, diet, and exercise Recommended diet heavy in fruits and veggies and low in animal meats, cheeses, and dairy products, appropriate calorie intake Discussed appropriate weight for height Follow up at next visit  Medication management Check CBC, CMP/GFR  Mixed hyperlipidemia Continue medications: crestor Continue low cholesterol diet and exercise.  Check lipid panel.   History of colon polyps Follow up colonoscopy due in 2023-2024 with Dr. Benson Norway  GERD Initiated medication for new reflux related symptoms: famotidine 20 mg BID x 2 weeks then  PRN Discussed diet, avoiding triggers and other lifestyle changes  Abnormal thyroid test - TSH  Lumbar pain Had MRI- Awaiting results Continue to follow with Dr. Berenice Primas  Over 30 minutes of exam, counseling, chart review, and critical decision making was performed  Future Appointments  Date Time Provider [place]  06/28/2022  9:30 AM [name], MD [facility] None  12/25/2022 10:00 AM [name], MD [facility] None     Plan:   During the course of the visit the patient was educated and counseled about appropriate screening and preventive services including:   Pneumococcal vaccine  Influenza vaccine Prevnar 13 Td vaccine Screening electrocardiogram Colorectal cancer screening Diabetes screening Glaucoma screening Nutrition counseling    Subjective:  [name] is a 71 y.o. male who presents for Medicare Annual Wellness Visit and 3 month follow up for HTN, hyperlipidemia, prediabetes, and vitamin D Def.    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 Levels have been routinely checked at this office and have remained stable over many years. Last 5.7 on 12/19/21  He is seeing Dr. Berenice Primas as needed, shoulders improved after steroid injection, no surgery needed.   He has been experiencing back pain. He describes the pain in his lower back that would go down left leg to lower buttock, then switched to right side. Describes the pain as sharp stabbing pain.  Better when sitting, worse with lying flat and standing. Had MRI of his back last week and returns to ortho for results next week. Dr. Berenice Primas gave him Prednisone for a few days with no relief of pain. Does not recall any injury.  He reports  having more reflux, will take TUMS prn but is not bothering him as much.   BMI is Body mass index is 32.59 kg/m., he has been working on  diet and exercise, has property and shops and very active throughout the day, minimal sitting. Also started walking 20-30 min most days.  Wt Readings from Last 3 Encounters:  03/23/22 211 lb 3.2 oz (95.8 kg)  12/19/21 207 lb 9.6 oz (94.2 kg)  08/16/21 210 lb 3.2 oz (95.3 kg)   In 2008, patient underwent an emergent CABG and has done well since. He had an ETT-myoview in 5/13 that was a normal study.   Est with Dr. Aundra Dubin, has not followed up with cardiology since 2016, denies concerning sx, declined referral back.     His blood pressure has been controlled at home (130s-140/60s), currently on Metoprolol 130m QD, today their BP is BP: (!) 146/78  BP Readings from Last 3 Encounters:  03/23/22 (!) 146/78  12/19/21 (!) 170/80  08/16/21 (!) 142/80  He does workout. He denies chest pain, shortness of breath, dizziness.    He is on cholesterol medication (crestor 20 mg daily) and denies myalgias. His LDL cholesterol is at goal. The cholesterol last visit was:   Lab Results  Component Value Date   CHOL 140 12/19/2021   HDL 47 12/19/2021   LDLCALC 70 12/19/2021   TRIG 144 12/19/2021   CHOLHDL 3.0 12/19/2021    He has been working on diet and exercise for hx of prediabetes (6.1% in 2016, 5.7% in 07/2018, predates 2017), and denies foot ulcerations, increased appetite, nausea, paresthesia of the feet, polydipsia, polyuria, visual disturbances, vomiting and weight loss. Last A1C in the office was:  Lab Results  Component Value Date   HGBA1C 5.6 12/19/2021   He has not been drinking much water. Last GFR Lab Results  Component Value Date   EGFR 77 12/19/2021     Patient is on Vitamin D supplement and at goal at recent check:    Lab Results  Component Value Date   VD25OH 87407/31/2023      He is not currently on thyroid medication but had a slight elevation with last check.   Lab Results  Component Value Date   TSH 4.62 (H) 12/19/2021   T3TOTAL 109 08/13/2017      Medication  Review: Current Outpatient Medications on File Prior to Visit  Medication Sig Dispense Refill   Acetaminophen (TYLENOL PO) Take by mouth.     aspirin 81 MG tablet Take 81 mg by mouth daily.     carbamazepine (CARBATROL) 300 MG 12 hr capsule Take  1 capsule  Daily  to Prevent  ' Absence Seizures' 90 capsule 3   Cholecalciferol (VITAMIN D PO) Take 10,000 Units by mouth daily.     cyclobenzaprine (FLEXERIL) 10 MG tablet Take 10 mg by mouth every 8 (eight) hours as needed.     fish oil-omega-3 fatty acids 1000 MG capsule Take 1 g by mouth daily.     LUTEIN PO Take by mouth daily.     Magnesium 250 MG TABS Take 250 mg by mouth daily.     metoprolol succinate (TOPROL-XL) 100 MG 24 hr tablet TAKE 1 TABLET BY MOUTH DAILY FOR BLOOD PRESSURE 100 tablet 2   rosuvastatin (CRESTOR) 20 MG tablet TAKE 1 TABLET BY MOUTH  DAILY FOR CHOLESTEROL 90 tablet 1   tadalafil (CIALIS) 20 MG tablet Take  1/2-1 tablet  every 2-3 days  as needed for XXXX  30 tablet 1   No current facility-administered medications on file prior to visit.    Allergies: Allergies  Allergen Reactions   Lipitor [Atorvastatin]     myalgia   Niacin And Related Other (See Comments)    Pt unaware of this intolerance (10/31/13)    Current Problems (verified) has Seizure disorder ([place]); Hyperlipidemia; Hypertension; CAD (coronary artery disease); Vitamin D deficiency; Medication management; Obesity (BMI 30.0-34.9); Other abnormal glucose (prediabetes); Encounter for Medicare annual wellness exam; History of colonic polyps; and Gastroesophageal reflux disease on their problem list.  Screening Tests Immunization History  Administered Date(s) Administered   Influenza, High Dose Seasonal PF 02/23/2015, 04/21/2017, 05/03/2017, 03/28/2018, 02/27/2019, 05/04/2020, 03/09/2022   Moderna SARS-COV2 Booster Vaccination 08/25/2020   PFIZER(Purple Top)SARS-COV-2 Vaccination 06/26/2019, 08/04/2019, 02/26/2020   Pfizer Covid-19 Vaccine Bivalent Booster  37yr & up 03/09/2022   Pneumococcal Conjugate-13 12/17/2015   Pneumococcal Polysaccharide-23 04/22/2009   Td 04/22/2009   Tdap 07/17/2019   Zoster, Live 09/09/2009   Preventative care: Last colonoscopy: cologuard 07/2018 positive, had colonoscopy 09/11/2018, 3 polyps, follow up 08/2021 Echo 2008 CXR 2008 AB [place] Stress myoview 2013  Prior vaccinations: TD or Tdap: 06/2019  Influenza: 2021 Pneumococcal: 2010 Prevnar13: 2017 Shingles/Zostavax: 2011 Covid 19: 3/3, 2021, pfizer  Names of Other Physician/Practitioners you currently use: 1. [facility] here for primary care 2. Dr. GDelman Cheadle eye doctor, last visit 10/23 3. Dr. MLuretha Rued dentist, last visit 10/2021  Patient Care Team: MUnk Pinto MD as PCP - General (Internal Medicine) MLarey Dresser MD as Consulting Physician (Cardiology)  Surgical: He  has a past surgical history that includes Coronary artery bypass graft (08/2006); Cholecystectomy; and Hand surgery. Family His family history includes Diabetes in his father; Hypertension in his mother; Lupus in his sister; Stroke in his mother. Social history  He reports that he quit smoking about 53 years ago. His smoking use included cigarettes. He has never used smokeless tobacco. He reports that he does not drink alcohol and does not use drugs.  MEDICARE WELLNESS OBJECTIVES: Physical activity: Current Exercise Habits: Home exercise routine, Type of exercise: Other - see comments (wood working), Time (Minutes): 60, Frequency (Times/Week): 3, Weekly Exercise (Minutes/Week): 180, Intensity: Mild, Exercise limited by: orthopedic condition(s) Cardiac risk factors: Cardiac Risk Factors include: advanced age (>566m, >6>37omen);hypertension;male gender;smoking/ tobacco exposure;obesity (BMI >30kg/m2);dyslipidemia Depression/mood screen:      03/23/2022   10:15 AM  Depression screen PHQ 2/9  Decreased Interest 0  Down, Depressed, Hopeless 0   PHQ - 2 Score 0    ADLs:     03/23/2022   10:15 AM  In your present state of health, do you have any difficulty performing the following activities:  Hearing? 0  Vision? 0  Difficulty concentrating or making decisions? 0  Walking or climbing stairs? 0  Dressing or bathing? 0  Doing errands, shopping? 0     Cognitive Testing  Alert? Yes  Normal Appearance?Yes  Oriented to person? Yes  Place? Yes   Time? Yes  Recall of three objects?  Yes  Can perform simple calculations? Yes  Displays appropriate judgment?Yes  Can read the correct time from a watch face?Yes  EOL planning: Does Patient Have a Medical Advance Directive?: Yes Type of Advance Directive: Healthcare Power of Attorney, Living will Does patient want to make changes to medical advance directive?: No - Patient declined Copy of [place] Chart?: No - copy requested   Objective:   Today's Vitals  03/23/22 0946  BP: (!) 146/78  Pulse: (!) 48  Temp: 97.7 F (36.5 C)  SpO2: 97%  Weight: 211 lb 3.2 oz (95.8 kg)  Height: 5' 7.5" (1.715 m)    Body mass index is 32.59 kg/m.  General appearance: alert, no distress, WD/WN, male HEENT: normocephalic, sclerae anicteric, TMs pearly, nares patent, no discharge or erythema, pharynx normal Oral cavity: MMM, no lesions Neck: supple, no lymphadenopathy, no thyromegaly, no masses Heart: RRR, normal S1, S2, no murmurs Lungs: CTA bilaterally, no wheezes, rhonchi, or rales Abdomen: +bs, soft, non tender, non distended, no masses, no hepatomegaly, no splenomegaly Musculoskeletal: nontender, no swelling, no obvious deformity Extremities: no edema, no cyanosis, no clubbing Pulses: 2+ symmetric, upper and lower extremities, normal cap refill Neurological: alert, oriented x 3, CN2-12 intact, strength normal upper extremities and lower extremities, sensation normal throughout, DTRs 2+ throughout, no cerebellar signs, gait normal Psychiatric: normal affect,  behavior normal, pleasant   Medicare Attestation I have personally reviewed: The patient's medical and social history Their use of alcohol, tobacco or illicit drugs Their current medications and supplements The patient's functional ability including ADLs,fall risks, home safety risks, cognitive, and hearing and visual impairment Diet and physical activities Evidence for depression or mood disorders  The patient's weight, height, BMI, and visual acuity have been recorded in the chart.  I have made referrals, counseling, and provided education to the patient based on review of the above and I have provided the patient with a written personalized care plan for preventive services.     Alycia Rossetti, NP   03/23/2022

## 2022-03-23 ENCOUNTER — Ambulatory Visit (INDEPENDENT_AMBULATORY_CARE_PROVIDER_SITE_OTHER): Payer: Medicare Other | Admitting: Nurse Practitioner

## 2022-03-23 ENCOUNTER — Encounter: Payer: Self-pay | Admitting: Nurse Practitioner

## 2022-03-23 VITALS — BP 146/78 | HR 48 | Temp 97.7°F | Ht 67.5 in | Wt 211.2 lb

## 2022-03-23 DIAGNOSIS — R6889 Other general symptoms and signs: Secondary | ICD-10-CM | POA: Diagnosis not present

## 2022-03-23 DIAGNOSIS — E66811 Obesity, class 1: Secondary | ICD-10-CM

## 2022-03-23 DIAGNOSIS — R7989 Other specified abnormal findings of blood chemistry: Secondary | ICD-10-CM

## 2022-03-23 DIAGNOSIS — I25810 Atherosclerosis of coronary artery bypass graft(s) without angina pectoris: Secondary | ICD-10-CM

## 2022-03-23 DIAGNOSIS — Z79899 Other long term (current) drug therapy: Secondary | ICD-10-CM

## 2022-03-23 DIAGNOSIS — E559 Vitamin D deficiency, unspecified: Secondary | ICD-10-CM

## 2022-03-23 DIAGNOSIS — G40909 Epilepsy, unspecified, not intractable, without status epilepticus: Secondary | ICD-10-CM

## 2022-03-23 DIAGNOSIS — M545 Low back pain, unspecified: Secondary | ICD-10-CM | POA: Diagnosis not present

## 2022-03-23 DIAGNOSIS — Z0000 Encounter for general adult medical examination without abnormal findings: Secondary | ICD-10-CM

## 2022-03-23 DIAGNOSIS — I10 Essential (primary) hypertension: Secondary | ICD-10-CM | POA: Diagnosis not present

## 2022-03-23 DIAGNOSIS — E782 Mixed hyperlipidemia: Secondary | ICD-10-CM

## 2022-03-23 DIAGNOSIS — Z0001 Encounter for general adult medical examination with abnormal findings: Secondary | ICD-10-CM

## 2022-03-23 DIAGNOSIS — E669 Obesity, unspecified: Secondary | ICD-10-CM

## 2022-03-23 DIAGNOSIS — R7309 Other abnormal glucose: Secondary | ICD-10-CM | POA: Diagnosis not present

## 2022-03-23 NOTE — Patient Instructions (Signed)

## 2022-03-24 LAB — COMPLETE METABOLIC PANEL WITH GFR
AG Ratio: 1.8 (calc) (ref 1.0–2.5)
ALT: 20 U/L (ref 9–46)
AST: 19 U/L (ref 10–35)
Albumin: 4.3 g/dL (ref 3.6–5.1)
Alkaline phosphatase (APISO): 53 U/L (ref 35–144)
BUN: 15 mg/dL (ref 7–25)
CO2: 29 mmol/L (ref 20–32)
Calcium: 9.5 mg/dL (ref 8.6–10.3)
Chloride: 104 mmol/L (ref 98–110)
Creat: 1.03 mg/dL (ref 0.70–1.28)
Globulin: 2.4 g/dL (calc) (ref 1.9–3.7)
Glucose, Bld: 109 mg/dL — ABNORMAL HIGH (ref 65–99)
Potassium: 4.2 mmol/L (ref 3.5–5.3)
Sodium: 141 mmol/L (ref 135–146)
Total Bilirubin: 0.4 mg/dL (ref 0.2–1.2)
Total Protein: 6.7 g/dL (ref 6.1–8.1)
eGFR: 77 mL/min/{1.73_m2} (ref >=60)

## 2022-03-24 LAB — CBC WITH DIFFERENTIAL/PLATELET
Absolute Monocytes: 418 cells/uL (ref 200–950)
Basophils Absolute: 30 cells/uL (ref 0–200)
Basophils Relative: 0.8 %
Eosinophils Absolute: 80 cells/uL (ref 15–500)
Eosinophils Relative: 2.1 %
HCT: 39.7 % (ref 38.5–50.0)
Hemoglobin: 13.9 g/dL (ref 13.2–17.1)
Lymphs Abs: 1197 cells/uL (ref 850–3900)
MCH: 31.7 pg (ref 27.0–33.0)
MCHC: 35 g/dL (ref 32.0–36.0)
MCV: 90.6 fL (ref 80.0–100.0)
MPV: 9.6 fL (ref 7.5–12.5)
Monocytes Relative: 11 %
Neutro Abs: 2075 cells/uL (ref 1500–7800)
Neutrophils Relative %: 54.6 %
Platelets: 182 10*3/uL (ref 140–400)
RBC: 4.38 10*6/uL (ref 4.20–5.80)
RDW: 12.3 % (ref 11.0–15.0)
Total Lymphocyte: 31.5 %
WBC: 3.8 10*3/uL (ref 3.8–10.8)

## 2022-03-24 LAB — LIPID PANEL
Cholesterol: 150 mg/dL (ref <200)
HDL: 43 mg/dL (ref >=40)
LDL Cholesterol (Calc): 71 mg/dL (calc)
Non-HDL Cholesterol (Calc): 107 mg/dL (calc) (ref <130)
Total CHOL/HDL Ratio: 3.5 (calc) (ref <5.0)
Triglycerides: 270 mg/dL — ABNORMAL HIGH (ref <150)

## 2022-03-24 LAB — TSH: TSH: 4.36 mIU/L (ref 0.40–4.50)

## 2022-03-28 DIAGNOSIS — M48061 Spinal stenosis, lumbar region without neurogenic claudication: Secondary | ICD-10-CM | POA: Diagnosis not present

## 2022-03-28 DIAGNOSIS — M545 Low back pain, unspecified: Secondary | ICD-10-CM | POA: Diagnosis not present

## 2022-04-03 DIAGNOSIS — M48062 Spinal stenosis, lumbar region with neurogenic claudication: Secondary | ICD-10-CM | POA: Diagnosis not present

## 2022-04-20 DIAGNOSIS — M48062 Spinal stenosis, lumbar region with neurogenic claudication: Secondary | ICD-10-CM | POA: Diagnosis not present

## 2022-04-24 ENCOUNTER — Other Ambulatory Visit: Payer: Self-pay | Admitting: Internal Medicine

## 2022-04-25 DIAGNOSIS — M5416 Radiculopathy, lumbar region: Secondary | ICD-10-CM | POA: Diagnosis not present

## 2022-05-05 DIAGNOSIS — M5416 Radiculopathy, lumbar region: Secondary | ICD-10-CM | POA: Diagnosis not present

## 2022-05-08 DIAGNOSIS — M48062 Spinal stenosis, lumbar region with neurogenic claudication: Secondary | ICD-10-CM | POA: Diagnosis not present

## 2022-05-16 ENCOUNTER — Ambulatory Visit (INDEPENDENT_AMBULATORY_CARE_PROVIDER_SITE_OTHER): Payer: Medicare Other | Admitting: Internal Medicine

## 2022-05-16 ENCOUNTER — Encounter: Payer: Self-pay | Admitting: Internal Medicine

## 2022-05-16 VITALS — BP 132/70 | HR 56 | Temp 97.9°F | Resp 16 | Ht 67.5 in | Wt 209.6 lb

## 2022-05-16 DIAGNOSIS — R1032 Left lower quadrant pain: Secondary | ICD-10-CM

## 2022-05-16 NOTE — Patient Instructions (Signed)
Diverticulitis  Diverticulitis is infection or inflammation of small pouches (diverticula) in the colon that form due to a condition called diverticulosis. Diverticula can trap stool (feces) and bacteria, causing infection and inflammation. Diverticulitis may cause severe stomach pain and diarrhea. It may lead to tissue damage in the colon that causes bleeding or blockage. The diverticula may also burst (rupture) and cause infected stool to enter other areas of the abdomen. What are the causes? This condition is caused by stool becoming trapped in the diverticula, which allows bacteria to grow in the diverticula. This leads to inflammation and infection. What increases the risk? You are more likely to develop this condition if you have diverticulosis. The risk increases if you: Are overweight or obese. Do not get enough exercise. Drink alcohol. Use tobacco products. Eat a diet that has a lot of red meat such as beef, pork, or lamb. Eat a diet that does not include enough fiber. High-fiber foods include fruits, vegetables, beans, nuts, and whole grains. Are over 40 years of age. What are the signs or symptoms? Symptoms of this condition may include: Pain and tenderness in the abdomen. The pain is normally located on the left side of the abdomen, but it may occur in other areas. Fever and chills. Nausea. Vomiting. Cramping. Bloating. Changes in bowel routines. Blood in your stool. How is this diagnosed? This condition is diagnosed based on: Your medical history. A physical exam. Tests to make sure there is nothing else causing your condition. These tests may include: Blood tests. Urine tests. CT scan of the abdomen. How is this treated? Most cases of this condition are mild and can be treated at home. Treatment may include: Taking over-the-counter pain medicines. Following a clear liquid diet. Taking antibiotic medicines by mouth. Resting. More severe cases may need to be treated  at a hospital. Treatment may include: Not eating or drinking. Taking prescription pain medicine. Receiving antibiotic medicines through an IV. Receiving fluids and nutrition through an IV. Surgery. When your condition is under control, your health care provider may recommend that you have a colonoscopy. This is an exam to look at the entire large intestine. During the exam, a lubricated, bendable tube is inserted into the anus and then passed into the rectum, colon, and other parts of the large intestine. A colonoscopy can show how severe your diverticula are and whether something else may be causing your symptoms. Follow these instructions at home: Medicines Take over-the-counter and prescription medicines only as told by your health care provider. These include fiber supplements, probiotics, and stool softeners. If you were prescribed an antibiotic medicine, take it as told by your health care provider. Do not stop taking the antibiotic even if you start to feel better. Ask your health care provider if the medicine prescribed to you requires you to avoid driving or using machinery. Eating and drinking  Follow a full liquid diet or another diet as directed by your health care provider. After your symptoms improve, your health care provider may tell you to change your diet. He or she may recommend that you eat a diet that contains at least 25 grams (25 g) of fiber daily. Fiber makes it easier to pass stool. Healthy sources of fiber include: Berries. One cup contains 4-8 grams of fiber. Beans or lentils. One-half cup contains 5-8 grams of fiber. Green vegetables. One cup contains 4 grams of fiber. Avoid eating red meat. General instructions Do not use any products that contain nicotine or tobacco, such as   cigarettes, e-cigarettes, and chewing tobacco. If you need help quitting, ask your health care provider. Exercise for at least 30 minutes, 3 times each week. You should exercise hard enough to  raise your heart rate and break a sweat. Keep all follow-up visits as told by your health care provider. This is important. You may need to have a colonoscopy. Contact a health care provider if: Your pain does not improve. Your bowel movements do not return to normal. Get help right away if: Your pain gets worse. Your symptoms do not get better with treatment. Your symptoms suddenly get worse. You have a fever. You vomit more than one time. You have stools that are bloody, black, or tarry. Summary Diverticulitis is infection or inflammation of small pouches (diverticula) in the colon that form due to a condition called diverticulosis. Diverticula can trap stool (feces) and bacteria, causing infection and inflammation. You are at higher risk for this condition if you have diverticulosis and you eat a diet that does not include enough fiber. Most cases of this condition are mild and can be treated at home. More severe cases may need to be treated at a hospital. When your condition is under control, your health care provider may recommend that you have an exam called a colonoscopy. This exam can show how severe your diverticula are and whether something else may be causing your symptoms. Keep all follow-up visits as told by your health care provider. This is important. This information is not intended to replace advice given to you by your health care provider. Make sure you discuss any questions you have with your health care provider. Document Revised: 02/17/2019 Document Reviewed: 02/17/2019 Elsevier Patient Education  2023 Elsevier Inc.  

## 2022-05-16 NOTE — Progress Notes (Signed)
     Future Appointments  Date Time Provider Department  05/16/2022  2:30 PM [name], MD [facility]  06/28/2022  9:30 AM [name], MD [facility]  12/25/2022 10:00 AM [name], MD [facility]    History of Present Illness:      This very nice 71 y.o. MWM  with HTN, ASCAD/CABG, HLD, Prediabetes, Seizures and Vitamin D Deficiency who presents  for concern of possible UTI. He describes 3-4 day prodrome of vague aches in the lower midline abdomen  suprapubic area. He denies any discoloration , blood or cloudiness to his urine. And likewise denies any dysuria or other LUTS. Denies fever, chills, sweats or change in BMs , diarrhea or constipation. Denies hx/o Diverticulitis.   Medications     metoprolol succinate (TOPROL-XL) 100 MG 24 hr tablet, TAKE 1 TABLET  DAILY    rosuvastatin (CRESTOR) 20 MG tablet, Take  1 tablet  Daily f   tadalafil (CIALIS) 20 MG tablet, Take  1/2-1 tablet  every 2-3 days  as needed   Acetaminophen    aspirin 81 MG tablet, Take  daily.   carbamazepine (CARBATROL) 300 MG 12 hr capsule, Take  1 capsule  Daily  to Prevent  ' Absence Seizures'   Cholecalciferol (VITAMIN D PO), Take 10,000 Units by mouth daily.   cyclobenzaprine (FLEXERIL) 10 MG tablet, Take 10 mg by mouth every 8 (eight) hours as needed.   fish oil-omega-3 fatty acids 1000 MG capsule, Take 1 g by mouth daily.   LUTEIN PO, Take by mouth daily.   Magnesium 250 MG TABS, Take 250 mg by mouth daily.  Problem list He has Seizure disorder ([place]); Hyperlipidemia; Hypertension; CAD (coronary artery disease); Vitamin D deficiency; Medication management; Obesity (BMI 30.0-34.9); Other abnormal glucose (prediabetes); Encounter for Medicare annual wellness exam; History of colonic polyps; and Gastroesophageal reflux disease on their problem list.   Observations/Objective:  BP 132/70   Pulse (!) 56   Temp 97.9 F (36.6 C)   Resp 16   Ht 5' 7.5" (1.715 m)   Wt 209 lb 9.6 oz (95.1 kg)    SpO2 95%   BMI 32.34 kg/m   HEENT - WNL. Neck - supple.  Chest - Clear equal BS. Cor - Nl HS. RRR w/o sig MGR. PP 1(+). No edema.  Abd - Soft , sl protuberant with moderate exquisite tender guarding to  superficial palpitation to the LLQ to the midline suprapubic area.  No Rebound. BS Nl.   MS- FROM w/o deformities.  Gait Nl. Neuro -  Nl w/o focal abnormalities.   Assessment and Plan:  1. LLQ abdominal pain  - CBC with Differential/Platelet  2. Left lower quadrant abdominal pain  -  STAT CT Abdomen Pelvis Wo Contrast  - CBC with Differential/Platelet - Urinalysis, Routine w reflex microscopic - Urine Culture   - Patient given strict ER precautions if developed worsening sx's, fever or worsening pains.    Follow Up Instructions:        I discussed the assessment and treatment plan with the patient. The patient was provided an opportunity to ask questions and all were answered. The patient agreed with the plan and demonstrated an understanding of the instructions.       The patient was advised to call back or seek an in-person evaluation if the symptoms worsen or if the condition fails to improve as anticipated.    Kirtland Bouchard, MD

## 2022-05-17 ENCOUNTER — Other Ambulatory Visit: Payer: Self-pay | Admitting: Internal Medicine

## 2022-05-17 ENCOUNTER — Ambulatory Visit (HOSPITAL_COMMUNITY)
Admission: RE | Admit: 2022-05-17 | Discharge: 2022-05-17 | Disposition: A | Discharge status: Home / Self Care | Payer: Medicare Other | Source: Ambulatory Visit | Attending: Internal Medicine | Admitting: Internal Medicine

## 2022-05-17 DIAGNOSIS — R1032 Left lower quadrant pain: Secondary | ICD-10-CM | POA: Insufficient documentation

## 2022-05-17 DIAGNOSIS — K7689 Other specified diseases of liver: Secondary | ICD-10-CM | POA: Diagnosis not present

## 2022-05-17 DIAGNOSIS — N400 Enlarged prostate without lower urinary tract symptoms: Secondary | ICD-10-CM

## 2022-05-17 DIAGNOSIS — N281 Cyst of kidney, acquired: Secondary | ICD-10-CM | POA: Diagnosis not present

## 2022-05-17 DIAGNOSIS — K5732 Diverticulitis of large intestine without perforation or abscess without bleeding: Secondary | ICD-10-CM

## 2022-05-17 LAB — CBC WITH DIFFERENTIAL/PLATELET
Absolute Monocytes: 622 cells/uL (ref 200–950)
Basophils Absolute: 28 cells/uL (ref 0–200)
Basophils Relative: 0.5 %
Eosinophils Absolute: 73 cells/uL (ref 15–500)
Eosinophils Relative: 1.3 %
HCT: 40.6 % (ref 38.5–50.0)
Hemoglobin: 14.1 g/dL (ref 13.2–17.1)
Lymphs Abs: 1635 cells/uL (ref 850–3900)
MCH: 31.5 pg (ref 27.0–33.0)
MCHC: 34.7 g/dL (ref 32.0–36.0)
MCV: 90.8 fL (ref 80.0–100.0)
MPV: 10.2 fL (ref 7.5–12.5)
Monocytes Relative: 11.1 %
Neutro Abs: 3242 cells/uL (ref 1500–7800)
Neutrophils Relative %: 57.9 %
Platelets: 158 10*3/uL (ref 140–400)
RBC: 4.47 10*6/uL (ref 4.20–5.80)
RDW: 12.4 % (ref 11.0–15.0)
Total Lymphocyte: 29.2 %
WBC: 5.6 10*3/uL (ref 3.8–10.8)

## 2022-05-17 LAB — URINE CULTURE
MICRO NUMBER:: 14357344
Result:: NO GROWTH
SPECIMEN QUALITY:: ADEQUATE

## 2022-05-17 LAB — URINALYSIS, ROUTINE W REFLEX MICROSCOPIC
Bilirubin Urine: NEGATIVE
Glucose, UA: NEGATIVE
Hgb urine dipstick: NEGATIVE
Ketones, ur: NEGATIVE
Leukocytes,Ua: NEGATIVE
Nitrite: NEGATIVE
Protein, ur: NEGATIVE
Specific Gravity, Urine: 1.008 (ref 1.001–1.035)
pH: 6 (ref 5.0–8.0)

## 2022-05-17 MED ORDER — CIPROFLOXACIN HCL 500 MG PO TABS: ORAL_TABLET | ORAL | 1 refills | Status: DC

## 2022-05-17 MED ORDER — METRONIDAZOLE 500 MG PO TABS: ORAL_TABLET | ORAL | 1 refills | Status: DC

## 2022-05-17 MED ORDER — FINASTERIDE 5 MG PO TABS: ORAL_TABLET | ORAL | 3 refills | Status: DC

## 2022-05-17 NOTE — Progress Notes (Signed)
<><><><><><><><><><><><><><><><><><><><><><><><><><><><><><><><><> <><><><><><><><><><><><><><><><><><><><><><><><><><><><><><><><><>  -   CBC  - Normal Red cell  & WBC  - U/A   -  OK - No Infection   <><><><><><><><><><><><><><><><><><><><><><><><><><><><><><><><><> <><><><><><><><><><><><><><><><><><><><><><><><><><><><><><><><><>

## 2022-05-17 NOTE — Progress Notes (Signed)
<><><><><><><><><><><><><><><><><><><><><><><><><><><><><><><><><> <><><><><><><><><><><><><><><><><><><><><><><><><><><><><><><><><>  -   As discussed  Mild Diverticulitis   - So Rx for Cipro 2 x /day & Flagyl 3 x / day - both for 10 days [facility]  - With refills to have on hand  - All else OK on CT scan.   -  Also as discussed , Rx for Proscar / Finasteride  to take 1/4 tab daily   <><><><><><><><><><><><><><><><><><><><><><><><><><><><><><><><><> <><><><><><><><><><><><><><><><><><><><><><><><><><><><><><><><><>

## 2022-05-19 DIAGNOSIS — M5416 Radiculopathy, lumbar region: Secondary | ICD-10-CM | POA: Diagnosis not present

## 2022-05-23 ENCOUNTER — Other Ambulatory Visit: Payer: Self-pay | Admitting: Internal Medicine

## 2022-05-23 DIAGNOSIS — G40909 Epilepsy, unspecified, not intractable, without status epilepticus: Secondary | ICD-10-CM

## 2022-05-23 MED ORDER — CARBAMAZEPINE ER 300 MG PO CP12: ORAL_CAPSULE | ORAL | 3 refills | Status: DC

## 2022-05-24 DIAGNOSIS — M5416 Radiculopathy, lumbar region: Secondary | ICD-10-CM | POA: Diagnosis not present

## 2022-06-27 ENCOUNTER — Encounter: Payer: Self-pay | Admitting: Internal Medicine

## 2022-06-27 NOTE — Progress Notes (Unsigned)
Future Appointments  Date Time Provider Department  06/28/2022  9:30 AM [name], MD [facility]  12/25/2022 10:00 AM [name], MD [facility]    History of Present Illness:       This very nice 72 y.o.  MWM presents for 6 month follow up with HTN, ASCAD/CABG,  HLD, hx/o Seizures, Pre-Diabetes and Vitamin D Deficiency.  In 1987, patient was dx\d with "Absence" Seizures which are controlled on Keppra.         Patient is treated for HTN (2004) & BP has been controlled at home. Today's BP is at goal -  134/70 .  In 2008, patient underwent emergent CABG & has done well since then. Patient has had no complaints of any cardiac type chest pain, palpitations, dyspnea / orthopnea / PND, dizziness, claudication, or dependent edema.        Hyperlipidemia is controlled with diet & meds. Patient denies myalgias or other med SE's. Last Lipids were at goal except slightly elevated Trig's:  Lab Results  Component Value Date   CHOL 150 03/23/2022   HDL 43 03/23/2022   LDLCALC 71 03/23/2022   TRIG 270 (H) 03/23/2022   CHOLHDL 3.5 03/23/2022     Also, the patient has history of PreDiabetes ("A1c 5.7% /2014) and has had no symptoms of reactive hypoglycemia, diabetic polys, paresthesias or visual blurring.  Last A1c was normal & at goal:  Lab Results  Component Value Date   HGBA1C 5.6 12/19/2021                                                        Further, the patient also has history of Vitamin D Deficiency ("24" /2008) and supplements vitamin D without any suspected side-effects. Last vitamin D was at goal:   Lab Results  Component Value Date   VD25OH 51 12/19/2021       Current Outpatient Medications  Medication Instructions   Acetaminophen (TYLENOL PO) Oral   aspirin 81 mg, Oral, Daily,     carbamazepine (CARBATROL) 300 MG 12 hr capsule Take  1 capsule  Daily  to Prevent  ' Absence Seizures '   Cholecalciferol (VITAMIN D PO) 10,000 Units, Oral, Daily,      ciprofloxacin (CIPRO) 500 MG tablet Take 1 tablet  2 x /day with Meals for Diverticulitis   cyclobenzaprine (FLEXERIL) 10 mg, Oral, Every 8 hours PRN   finasteride (PROSCAR) 5 MG tablet Take 1 tablet daily for prostate   fish oil-omega-3 fatty acids 1 g, Oral, Daily   LUTEIN PO Oral, Daily   Magnesium 250 mg, Oral, Daily   metoprolol succinate -XL 100 MG 24 hr tablet TAKE 1 TABLET DAILY    metroNIDAZOLE (FLAGYL) 500 MG tablet Take  1 tablet  3 x /day   with Meals for Diverticulitis   rosuvastatin (CRESTOR) 20 MG tablet Take  1 tablet  Daily    tadalafil (CIALIS) 20 MG tablet Take  1/2-1 tablet  every 2-3 days  as needed     Allergies  Allergen Reactions   Lipitor [Atorvastatin]     myalgia   Niacin And Related Other (See Comments)    Pt unaware of this intolerance (10/31/13)    PMHx:   Past Medical History:  Diagnosis Date   Bilateral carpal tunnel syndrome    Dyslipidemia  Hyperlipidemia    Ischemic heart disease    Plantar fasciitis    Seizure disorder ([place])    Vitamin D deficiency     Immunization History  Administered Date(s) Administered   Influenza, High Dose Seasonal PF 02/23/2015, 04/21/2017, 05/03/2017, 03/28/2018, 02/27/2019   PFIZER SARS-COV-2 Vaccination 06/26/2019, 08/04/2019, 02/26/2020   Pneumococcal Conjugate-13 12/17/2015   Pneumococcal Polysaccharide-23 04/22/2009   Td 04/22/2009   Tdap 07/17/2019   Zoster 09/09/2009    Past Surgical History:  Procedure Laterality Date   CHOLECYSTECTOMY     CORONARY ARTERY BYPASS GRAFT  08/2006   HAND SURGERY      FHx:    Reviewed / unchanged  SHx:    Reviewed / unchanged   Systems Review:  Constitutional: Denies fever, chills, wt changes, headaches, insomnia, fatigue, night sweats, change in appetite. Eyes: Denies redness, blurred vision, diplopia, discharge, itchy, watery eyes.  ENT: Denies discharge, congestion, post nasal drip, epistaxis, sore throat, earache, hearing loss, dental pain, tinnitus,  vertigo, sinus pain, snoring.  CV: Denies chest pain, palpitations, irregular heartbeat, syncope, dyspnea, diaphoresis, orthopnea, PND, claudication or edema. Respiratory: denies cough, dyspnea, DOE, pleurisy, hoarseness, laryngitis, wheezing.  Gastrointestinal: Denies dysphagia, odynophagia, heartburn, reflux, water brash, abdominal pain or cramps, nausea, vomiting, bloating, diarrhea, constipation, hematemesis, melena, hematochezia  or hemorrhoids. Genitourinary: Denies dysuria, frequency, urgency, nocturia, hesitancy, discharge, hematuria or flank pain. Musculoskeletal: Denies arthralgias, myalgias, stiffness, jt. swelling, pain, limping or strain/sprain.  Skin: Denies pruritus, rash, hives, warts, acne, eczema or change in skin lesion(s). Neuro: No weakness, tremor, incoordination, spasms, paresthesia or pain. Psychiatric: Denies confusion, memory loss or sensory loss. Endo: Denies change in weight, skin or hair change.  Heme/Lymph: No excessive bleeding, bruising or enlarged lymph nodes.  Physical Exam  BP 134/70   Pulse (!) 58   Temp 97.9 F (36.6 C)   Resp 17   Ht 5' 7.5" (1.715 m)   Wt 202 lb 12.8 oz (92 kg)   SpO2 98%   BMI 31.29 kg/m   Appears  well nourished, well groomed  and in no distress.  Eyes: PERRLA, EOMs, conjunctiva no swelling or erythema. Sinuses: No frontal/maxillary tenderness ENT/Mouth: EAC's clear, TM's nl w/o erythema, bulging. Nares clear w/o erythema, swelling, exudates. Oropharynx clear without erythema or exudates. Oral hygiene is good. Tongue normal, non obstructing. Hearing intact.  Neck: Supple. Thyroid not palpable. Car 2+/2+ without bruits, nodes or JVD. Chest: Respirations nl with BS clear & equal w/o rales, rhonchi, wheezing or stridor.  Cor: Heart sounds normal w/ regular rate and rhythm without sig. murmurs, gallops, clicks or rubs. Peripheral pulses normal and equal  without edema.  Abdomen: Soft & bowel sounds normal. Non-tender w/o guarding,  rebound, hernias, masses or organomegaly.  Lymphatics: Unremarkable.  Musculoskeletal: Full ROM all peripheral extremities, joint stability, 5/5 strength and normal gait.  Skin: Warm, dry without exposed rashes, lesions or ecchymosis apparent.  Neuro: Cranial nerves intact, reflexes equal bilaterally. Sensory-motor testing grossly intact. Tendon reflexes grossly intact.  Pysch: Alert & oriented x 3.  Insight and judgement nl & appropriate. No ideations.  Assessment and Plan:  1. Essential hypertension  - Continue medication, monitor blood pressure at home.  - Continue DASH diet.  Reminder to go to the ER if any CP,  SOB, nausea, dizziness, severe HA, changes vision/speech.    - CBC with Differential/Platelet - COMPLETE METABOLIC PANEL WITH GFR - Magnesium - TSH  2. Hyperlipidemia, mixed  - Continue diet/meds, exercise,& lifestyle modifications.  - Continue monitor periodic  cholesterol/liver & renal functions     - Lipid panel - TSH  3. Abnormal glucose  - Continue diet, exercise  - Lifestyle modifications.  - Monitor appropriate labs    - Hemoglobin A1c - Insulin, random  4. Vitamin D deficiency  - Continue supplementation   - VITAMIN D 25 Hydroxy   5. Coronary artery disease involving coronary bypass                             graft of native heart without angina pectoris  - Lipid panel  6. Seizure disorder (HCC)  - Carbamazepine level, total  7. Medication management  - CBC with Differential/Platelet - COMPLETE METABOLIC PANEL WITH GFR - Magnesium - Lipid panel - TSH - Hemoglobin A1c - Insulin, random - VITAMIN D 25 Hydroxy  - Carbamazepine level, total        Discussed  regular exercise, BP monitoring, weight control to achieve/maintain BMI less than 25 and discussed med and SE's. Recommended labs to assess and monitor clinical status with further disposition pending results of labs.  I discussed the assessment and treatment plan with the patient.  The patient was provided an opportunity to ask questions and all were answered. The patient agreed with the plan and demonstrated an understanding of the instructions.  I provided over 30 minutes of exam, counseling, chart review and  complex critical decision making.   Kirtland Bouchard, MD

## 2022-06-27 NOTE — Patient Instructions (Signed)

## 2022-06-28 ENCOUNTER — Encounter: Payer: Self-pay | Admitting: Internal Medicine

## 2022-06-28 ENCOUNTER — Ambulatory Visit (INDEPENDENT_AMBULATORY_CARE_PROVIDER_SITE_OTHER): Payer: Medicare Other | Admitting: Internal Medicine

## 2022-06-28 VITALS — BP 134/70 | HR 58 | Temp 97.9°F | Resp 17 | Ht 67.5 in | Wt 202.8 lb

## 2022-06-28 DIAGNOSIS — I25810 Atherosclerosis of coronary artery bypass graft(s) without angina pectoris: Secondary | ICD-10-CM

## 2022-06-28 DIAGNOSIS — E782 Mixed hyperlipidemia: Secondary | ICD-10-CM

## 2022-06-28 DIAGNOSIS — G40909 Epilepsy, unspecified, not intractable, without status epilepticus: Secondary | ICD-10-CM

## 2022-06-28 DIAGNOSIS — Z79899 Other long term (current) drug therapy: Secondary | ICD-10-CM

## 2022-06-28 DIAGNOSIS — I10 Essential (primary) hypertension: Secondary | ICD-10-CM | POA: Diagnosis not present

## 2022-06-28 DIAGNOSIS — E559 Vitamin D deficiency, unspecified: Secondary | ICD-10-CM | POA: Diagnosis not present

## 2022-06-28 DIAGNOSIS — K5732 Diverticulitis of large intestine without perforation or abscess without bleeding: Secondary | ICD-10-CM

## 2022-06-28 DIAGNOSIS — R7309 Other abnormal glucose: Secondary | ICD-10-CM | POA: Diagnosis not present

## 2022-06-28 MED ORDER — CIPROFLOXACIN HCL 500 MG PO TABS: ORAL_TABLET | ORAL | 1 refills | Status: DC

## 2022-06-28 MED ORDER — DEXAMETHASONE 4 MG PO TABS: ORAL_TABLET | ORAL | 0 refills | Status: DC

## 2022-06-28 MED ORDER — METRONIDAZOLE 500 MG PO TABS: ORAL_TABLET | ORAL | 1 refills | Status: DC

## 2022-06-29 LAB — COMPLETE METABOLIC PANEL WITH GFR
AG Ratio: 1.7 (calc) (ref 1.0–2.5)
ALT: 22 U/L (ref 9–46)
AST: 18 U/L (ref 10–35)
Albumin: 4.7 g/dL (ref 3.6–5.1)
Alkaline phosphatase (APISO): 69 U/L (ref 35–144)
BUN: 18 mg/dL (ref 7–25)
CO2: 30 mmol/L (ref 20–32)
Calcium: 10 mg/dL (ref 8.6–10.3)
Chloride: 103 mmol/L (ref 98–110)
Creat: 0.98 mg/dL (ref 0.70–1.28)
Globulin: 2.7 g/dL (calc) (ref 1.9–3.7)
Glucose, Bld: 111 mg/dL — ABNORMAL HIGH (ref 65–99)
Potassium: 4.6 mmol/L (ref 3.5–5.3)
Sodium: 141 mmol/L (ref 135–146)
Total Bilirubin: 0.5 mg/dL (ref 0.2–1.2)
Total Protein: 7.4 g/dL (ref 6.1–8.1)
eGFR: 81 mL/min/{1.73_m2} (ref >=60)

## 2022-06-29 LAB — CBC WITH DIFFERENTIAL/PLATELET
Absolute Monocytes: 418 cells/uL (ref 200–950)
Basophils Absolute: 38 cells/uL (ref 0–200)
Basophils Relative: 0.8 %
Eosinophils Absolute: 169 cells/uL (ref 15–500)
Eosinophils Relative: 3.6 %
HCT: 44.3 % (ref 38.5–50.0)
Hemoglobin: 15.4 g/dL (ref 13.2–17.1)
Lymphs Abs: 1250 cells/uL (ref 850–3900)
MCH: 31.9 pg (ref 27.0–33.0)
MCHC: 34.8 g/dL (ref 32.0–36.0)
MCV: 91.7 fL (ref 80.0–100.0)
MPV: 10 fL (ref 7.5–12.5)
Monocytes Relative: 8.9 %
Neutro Abs: 2825 cells/uL (ref 1500–7800)
Neutrophils Relative %: 60.1 %
Platelets: 175 10*3/uL (ref 140–400)
RBC: 4.83 10*6/uL (ref 4.20–5.80)
RDW: 12.2 % (ref 11.0–15.0)
Total Lymphocyte: 26.6 %
WBC: 4.7 10*3/uL (ref 3.8–10.8)

## 2022-06-29 LAB — LIPID PANEL
Cholesterol: 128 mg/dL (ref <200)
HDL: 44 mg/dL (ref >=40)
LDL Cholesterol (Calc): 62 mg/dL (calc)
Non-HDL Cholesterol (Calc): 84 mg/dL (calc) (ref <130)
Total CHOL/HDL Ratio: 2.9 (calc) (ref <5.0)
Triglycerides: 140 mg/dL (ref <150)

## 2022-06-29 LAB — MAGNESIUM: Magnesium: 2 mg/dL (ref 1.5–2.5)

## 2022-06-29 LAB — HEMOGLOBIN A1C
Hgb A1c MFr Bld: 5.9 % of total Hgb — ABNORMAL HIGH (ref <5.7)
Mean Plasma Glucose: 123 mg/dL
eAG (mmol/L): 6.8 mmol/L

## 2022-06-29 LAB — CARBAMAZEPINE LEVEL, TOTAL: Carbamazepine Lvl: 5.8 mg/L (ref 4.0–12.0)

## 2022-06-29 LAB — INSULIN, RANDOM: Insulin: 9.4 u[IU]/mL

## 2022-06-29 LAB — VITAMIN D 25 HYDROXY (VIT D DEFICIENCY, FRACTURES): Vit D, 25-Hydroxy: 81 ng/mL (ref 30–100)

## 2022-06-29 LAB — TSH: TSH: 3.48 mIU/L (ref 0.40–4.50)

## 2022-06-29 NOTE — Progress Notes (Signed)
<><><><><><><><><><><><><><><><><><><><><><><><><><><><><><><><><> <><><><><><><><><><><><><><><><><><><><><><><><><><><><><><><><><> -   Test results slightly outside the reference range are not unusual. If there is anything important, I will review this with you,  otherwise it is considered normal test values.  If you have further questions,  please do not hesitate to contact me at the office or via My Chart.  <><><><><><><><><><><><><><><><><><><><><><><><><><><><><><><><><> <><><><><><><><><><><><><><><><><><><><><><><><><><><><><><><><><>  -  A1c = 5.9% -glucose slightly elevated from previous, So recommend  - Avoid Sweets, Candy & White Stuff   - White Rice, White Potatoes, White Flour  - Breads &  Pasta <><><><><><><><><><><><><><><><><><><><><><><><><><><><><><><><><>  -  Total Chol = 128     &    LDL Chol = 62   - Both    Excellent   - Very low risk for Heart Attack  / Stroke <><><><><><><><><><><><><><><><><><><><><><><><><><><><><><><><><>  -  Vitamin D = 81 - Excellent  !      - Please keep dose same  <><><><><><><><><><><><><><><><><><><><><><><><><><><><><><><><><>  - Carbatrol Level  OK - Keep dose same  <><><><><><><><><><><><><><><><><><><><><><><><><><><><><><><><><>  -  All Else - CBC - Kidneys - Electrolytes - Liver - Magnesium & Thyroid    - all  Normal / OK <><><><><><><><><><><><><><><><><><><><><><><><><><><><><><><><><>  -  Keep up the Great Work  ! <><><><><><><><><><><><><><><><><><><><><><><><><><><><><><><><><>  -

## 2022-07-01 ENCOUNTER — Other Ambulatory Visit: Payer: Self-pay | Admitting: Internal Medicine

## 2022-07-01 MED ORDER — DEXAMETHASONE 4 MG PO TABS: ORAL_TABLET | ORAL | 0 refills | Status: DC

## 2022-07-06 ENCOUNTER — Telehealth: Payer: Self-pay | Admitting: Nurse Practitioner

## 2022-07-06 NOTE — Telephone Encounter (Signed)
  Pt said for some reason [facility] canceled the delivery of the Cipro and Flagyl. And is wanting it to be sent to [facility] in [place]

## 2022-07-11 ENCOUNTER — Emergency Department (HOSPITAL_BASED_OUTPATIENT_CLINIC_OR_DEPARTMENT_OTHER): Payer: Medicare Other | Admitting: Certified Registered"

## 2022-07-11 ENCOUNTER — Encounter (HOSPITAL_COMMUNITY)
Admission: EM | Disposition: A | Discharge status: Home / Self Care | Payer: Self-pay | Source: Home / Self Care | Attending: Emergency Medicine

## 2022-07-11 ENCOUNTER — Other Ambulatory Visit: Payer: Self-pay

## 2022-07-11 ENCOUNTER — Encounter (HOSPITAL_COMMUNITY): Payer: Self-pay | Admitting: General Surgery

## 2022-07-11 ENCOUNTER — Emergency Department (HOSPITAL_COMMUNITY): Payer: Medicare Other

## 2022-07-11 ENCOUNTER — Ambulatory Visit (HOSPITAL_COMMUNITY)
Admission: EM | Admit: 2022-07-11 | Discharge: 2022-07-11 | Disposition: A | Discharge status: Home / Self Care | Payer: Medicare Other | Attending: Emergency Medicine | Admitting: Emergency Medicine

## 2022-07-11 ENCOUNTER — Emergency Department (HOSPITAL_COMMUNITY): Payer: Medicare Other | Admitting: Certified Registered"

## 2022-07-11 DIAGNOSIS — Z951 Presence of aortocoronary bypass graft: Secondary | ICD-10-CM | POA: Diagnosis not present

## 2022-07-11 DIAGNOSIS — Z6830 Body mass index (BMI) 30.0-30.9, adult: Secondary | ICD-10-CM | POA: Insufficient documentation

## 2022-07-11 DIAGNOSIS — E785 Hyperlipidemia, unspecified: Secondary | ICD-10-CM | POA: Diagnosis not present

## 2022-07-11 DIAGNOSIS — Z79899 Other long term (current) drug therapy: Secondary | ICD-10-CM | POA: Insufficient documentation

## 2022-07-11 DIAGNOSIS — Z87891 Personal history of nicotine dependence: Secondary | ICD-10-CM | POA: Insufficient documentation

## 2022-07-11 DIAGNOSIS — E669 Obesity, unspecified: Secondary | ICD-10-CM | POA: Insufficient documentation

## 2022-07-11 DIAGNOSIS — I2510 Atherosclerotic heart disease of native coronary artery without angina pectoris: Secondary | ICD-10-CM | POA: Insufficient documentation

## 2022-07-11 DIAGNOSIS — K3580 Unspecified acute appendicitis: Secondary | ICD-10-CM | POA: Diagnosis not present

## 2022-07-11 DIAGNOSIS — R7303 Prediabetes: Secondary | ICD-10-CM | POA: Insufficient documentation

## 2022-07-11 DIAGNOSIS — R1030 Lower abdominal pain, unspecified: Secondary | ICD-10-CM | POA: Diagnosis not present

## 2022-07-11 DIAGNOSIS — R1032 Left lower quadrant pain: Secondary | ICD-10-CM | POA: Diagnosis not present

## 2022-07-11 DIAGNOSIS — K219 Gastro-esophageal reflux disease without esophagitis: Secondary | ICD-10-CM | POA: Insufficient documentation

## 2022-07-11 DIAGNOSIS — I10 Essential (primary) hypertension: Secondary | ICD-10-CM | POA: Insufficient documentation

## 2022-07-11 DIAGNOSIS — R109 Unspecified abdominal pain: Secondary | ICD-10-CM | POA: Diagnosis present

## 2022-07-11 DIAGNOSIS — K769 Liver disease, unspecified: Secondary | ICD-10-CM | POA: Diagnosis not present

## 2022-07-11 DIAGNOSIS — K353 Acute appendicitis with localized peritonitis, without perforation or gangrene: Secondary | ICD-10-CM | POA: Diagnosis not present

## 2022-07-11 DIAGNOSIS — G40909 Epilepsy, unspecified, not intractable, without status epilepticus: Secondary | ICD-10-CM | POA: Insufficient documentation

## 2022-07-11 DIAGNOSIS — I700 Atherosclerosis of aorta: Secondary | ICD-10-CM | POA: Diagnosis not present

## 2022-07-11 HISTORY — PX: LAPAROSCOPIC APPENDECTOMY: SHX408

## 2022-07-11 LAB — COMPREHENSIVE METABOLIC PANEL
ALT: 24 U/L (ref 0–44)
AST: 30 U/L (ref 15–41)
Albumin: 3.8 g/dL (ref 3.5–5.0)
Alkaline Phosphatase: 60 U/L (ref 38–126)
Anion gap: 9 (ref 5–15)
BUN: 18 mg/dL (ref 8–23)
CO2: 26 mmol/L (ref 22–32)
Calcium: 9.1 mg/dL (ref 8.9–10.3)
Chloride: 104 mmol/L (ref 98–111)
Creatinine, Ser: 1.01 mg/dL (ref 0.61–1.24)
GFR, Estimated: >60 mL/min (ref >60)
Glucose, Bld: 124 mg/dL — ABNORMAL HIGH (ref 70–99)
Potassium: 3.7 mmol/L (ref 3.5–5.1)
Sodium: 139 mmol/L (ref 135–145)
Total Bilirubin: 0.7 mg/dL (ref 0.3–1.2)
Total Protein: 6.3 g/dL — ABNORMAL LOW (ref 6.5–8.1)

## 2022-07-11 LAB — CBC WITH DIFFERENTIAL/PLATELET
Abs Immature Granulocytes: 0.02 10*3/uL (ref 0.00–0.07)
Basophils Absolute: 0 10*3/uL (ref 0.0–0.1)
Basophils Relative: 0 %
Eosinophils Absolute: 0.1 10*3/uL (ref 0.0–0.5)
Eosinophils Relative: 1 %
HCT: 39.7 % (ref 39.0–52.0)
Hemoglobin: 14 g/dL (ref 13.0–17.0)
Immature Granulocytes: 0 %
Lymphocytes Relative: 9 %
Lymphs Abs: 0.8 10*3/uL (ref 0.7–4.0)
MCH: 31.6 pg (ref 26.0–34.0)
MCHC: 35.3 g/dL (ref 30.0–36.0)
MCV: 89.6 fL (ref 80.0–100.0)
Monocytes Absolute: 0.6 10*3/uL (ref 0.1–1.0)
Monocytes Relative: 7 %
Neutro Abs: 6.7 10*3/uL (ref 1.7–7.7)
Neutrophils Relative %: 83 %
Platelets: 138 10*3/uL — ABNORMAL LOW (ref 150–400)
RBC: 4.43 MIL/uL (ref 4.22–5.81)
RDW: 11.9 % (ref 11.5–15.5)
WBC: 8.1 10*3/uL (ref 4.0–10.5)
nRBC: 0 % (ref 0.0–0.2)

## 2022-07-11 LAB — LIPASE, BLOOD: Lipase: 33 U/L (ref 11–51)

## 2022-07-11 LAB — TROPONIN I (HIGH SENSITIVITY)
Troponin I (High Sensitivity): 8 ng/L (ref <18)
Troponin I (High Sensitivity): 8 ng/L (ref <18)

## 2022-07-11 SURGERY — APPENDECTOMY, LAPAROSCOPIC
Anesthesia: General

## 2022-07-11 MED ORDER — PHENYLEPHRINE 80 MCG/ML (10ML) SYRINGE FOR IV PUSH (FOR BLOOD PRESSURE SUPPORT)
PREFILLED_SYRINGE | INTRAVENOUS | Status: DC | PRN
  Administered 2022-07-11: 80 ug via INTRAVENOUS

## 2022-07-11 MED ORDER — ONDANSETRON HCL 4 MG/2ML IJ SOLN
4.0000 mg | Freq: Once | INTRAMUSCULAR | Status: AC
  Administered 2022-07-11: 4 mg via INTRAVENOUS
  Filled 2022-07-11: qty 2

## 2022-07-11 MED ORDER — ROCURONIUM BROMIDE 10 MG/ML (PF) SYRINGE
PREFILLED_SYRINGE | INTRAVENOUS | Status: DC | PRN
  Administered 2022-07-11: 50 mg via INTRAVENOUS

## 2022-07-11 MED ORDER — MORPHINE SULFATE (PF) 4 MG/ML IV SOLN: 4.0000 mg | Freq: Once | INTRAVENOUS | Status: DC

## 2022-07-11 MED ORDER — FENTANYL CITRATE (PF) 100 MCG/2ML IJ SOLN
25.0000 ug | INTRAMUSCULAR | Status: DC | PRN
  Administered 2022-07-11 (×3): 50 ug via INTRAVENOUS

## 2022-07-11 MED ORDER — IOHEXOL 350 MG/ML SOLN
75.0000 mL | Freq: Once | INTRAVENOUS | Status: AC | PRN
  Administered 2022-07-11: 75 mL via INTRAVENOUS

## 2022-07-11 MED ORDER — SODIUM CHLORIDE 0.9 % IV SOLN
2.0000 g | INTRAVENOUS | Status: AC
  Administered 2022-07-11: 2 g via INTRAVENOUS

## 2022-07-11 MED ORDER — CHLORHEXIDINE GLUCONATE 0.12 % MT SOLN
15.0000 mL | Freq: Once | OROMUCOSAL | Status: AC
  Administered 2022-07-11: 15 mL via OROMUCOSAL

## 2022-07-11 MED ORDER — LIDOCAINE 2% (20 MG/ML) 5 ML SYRINGE
INTRAMUSCULAR | Status: DC | PRN
  Administered 2022-07-11: 40 mg via INTRAVENOUS

## 2022-07-11 MED ORDER — SUCCINYLCHOLINE CHLORIDE 200 MG/10ML IV SOSY
PREFILLED_SYRINGE | INTRAVENOUS | Status: DC | PRN
  Administered 2022-07-11: 100 mg via INTRAVENOUS

## 2022-07-11 MED ORDER — ORAL CARE MOUTH RINSE: 15.0000 mL | Freq: Once | OROMUCOSAL | Status: AC

## 2022-07-11 MED ORDER — CHLORHEXIDINE GLUCONATE CLOTH 2 % EX PADS: 6.0000 | MEDICATED_PAD | Freq: Once | CUTANEOUS | Status: DC

## 2022-07-11 MED ORDER — SUGAMMADEX SODIUM 200 MG/2ML IV SOLN
INTRAVENOUS | Status: DC | PRN
  Administered 2022-07-11: 200 mg via INTRAVENOUS

## 2022-07-11 MED ORDER — BUPIVACAINE-EPINEPHRINE 0.25% -1:200000 IJ SOLN
INTRAMUSCULAR | Status: DC | PRN
  Administered 2022-07-11: 12 mL

## 2022-07-11 MED ORDER — 0.9 % SODIUM CHLORIDE (POUR BTL) OPTIME
TOPICAL | Status: DC | PRN
  Administered 2022-07-11: 1000 mL

## 2022-07-11 MED ORDER — METRONIDAZOLE 500 MG/100ML IV SOLN: 500.0000 mg | Freq: Three times a day (TID) | INTRAVENOUS | Status: DC

## 2022-07-11 MED ORDER — FENTANYL CITRATE (PF) 250 MCG/5ML IJ SOLN
INTRAMUSCULAR | Status: DC | PRN
  Administered 2022-07-11: 50 ug via INTRAVENOUS
  Administered 2022-07-11: 100 ug via INTRAVENOUS

## 2022-07-11 MED ORDER — MORPHINE SULFATE (PF) 4 MG/ML IV SOLN
4.0000 mg | Freq: Once | INTRAVENOUS | Status: AC
  Administered 2022-07-11: 4 mg via INTRAVENOUS
  Filled 2022-07-11: qty 1

## 2022-07-11 MED ORDER — SODIUM CHLORIDE 0.9 % IV SOLN
INTRAVENOUS | Status: AC
  Filled 2022-07-11: qty 20

## 2022-07-11 MED ORDER — ACETAMINOPHEN 10 MG/ML IV SOLN
INTRAVENOUS | Status: AC
  Administered 2022-07-11: 1000 mg
  Filled 2022-07-11: qty 100

## 2022-07-11 MED ORDER — FENTANYL CITRATE (PF) 250 MCG/5ML IJ SOLN
INTRAMUSCULAR | Status: AC
  Filled 2022-07-11: qty 5

## 2022-07-11 MED ORDER — LACTATED RINGERS IV SOLN: INTRAVENOUS | Status: DC

## 2022-07-11 MED ORDER — FENTANYL CITRATE (PF) 100 MCG/2ML IJ SOLN
INTRAMUSCULAR | Status: AC
  Filled 2022-07-11: qty 2

## 2022-07-11 MED ORDER — CHLORHEXIDINE GLUCONATE CLOTH 2 % EX PADS
6.0000 | MEDICATED_PAD | Freq: Once | CUTANEOUS | Status: AC
  Administered 2022-07-11: 6 via TOPICAL

## 2022-07-11 MED ORDER — METRONIDAZOLE 500 MG/100ML IV SOLN
INTRAVENOUS | Status: AC
  Administered 2022-07-11: 500 mg via INTRAVENOUS
  Filled 2022-07-11: qty 100

## 2022-07-11 MED ORDER — ONDANSETRON HCL 4 MG/2ML IJ SOLN
INTRAMUSCULAR | Status: DC | PRN
  Administered 2022-07-11: 4 mg via INTRAVENOUS

## 2022-07-11 MED ORDER — PROPOFOL 10 MG/ML IV BOLUS
INTRAVENOUS | Status: AC
  Filled 2022-07-11: qty 20

## 2022-07-11 MED ORDER — EPHEDRINE SULFATE-NACL 50-0.9 MG/10ML-% IV SOSY
PREFILLED_SYRINGE | INTRAVENOUS | Status: DC | PRN
  Administered 2022-07-11: 5 mg via INTRAVENOUS

## 2022-07-11 MED ORDER — HYDROMORPHONE HCL 1 MG/ML IJ SOLN
1.0000 mg | Freq: Once | INTRAMUSCULAR | Status: AC
  Administered 2022-07-11: 1 mg via INTRAVENOUS
  Filled 2022-07-11: qty 1

## 2022-07-11 MED ORDER — SODIUM CHLORIDE 0.9 % IR SOLN
Status: DC | PRN
  Administered 2022-07-11: 1000 mL

## 2022-07-11 MED ORDER — PROPOFOL 10 MG/ML IV BOLUS
INTRAVENOUS | Status: DC | PRN
  Administered 2022-07-11: 110 mg via INTRAVENOUS

## 2022-07-11 MED ORDER — BUPIVACAINE-EPINEPHRINE (PF) 0.25% -1:200000 IJ SOLN
INTRAMUSCULAR | Status: AC
  Filled 2022-07-11: qty 30

## 2022-07-11 MED ORDER — PHENYLEPHRINE HCL-NACL 20-0.9 MG/250ML-% IV SOLN
INTRAVENOUS | Status: DC | PRN
  Administered 2022-07-11: 50 ug/min via INTRAVENOUS

## 2022-07-11 MED ORDER — LACTATED RINGERS IV SOLN: INTRAVENOUS | Status: DC | PRN

## 2022-07-11 MED ORDER — OXYCODONE HCL 5 MG PO TABS: 5.0000 mg | ORAL_TABLET | Freq: Four times a day (QID) | ORAL | 0 refills | Status: AC | PRN

## 2022-07-11 MED ORDER — MIDAZOLAM HCL 2 MG/2ML IJ SOLN
INTRAMUSCULAR | Status: AC
  Filled 2022-07-11: qty 2

## 2022-07-11 MED ORDER — BUPIVACAINE HCL (PF) 0.25 % IJ SOLN
INTRAMUSCULAR | Status: AC
  Filled 2022-07-11: qty 30

## 2022-07-11 SURGICAL SUPPLY — 46 items
ADH SKN CLS APL DERMABOND .7 (GAUZE/BANDAGES/DRESSINGS) ×1
APPLIER CLIP 5 13 M/L LIGAMAX5 (MISCELLANEOUS)
BAG COUNTER SPONGE SURGICOUNT (BAG) ×1 IMPLANT
BAG SPNG CNTER NS LX DISP (BAG) ×1
BLADE CLIPPER SURG (BLADE) IMPLANT
CANISTER SUCT 3000ML PPV (MISCELLANEOUS) ×1 IMPLANT
CHLORAPREP W/TINT 26 (MISCELLANEOUS) ×1 IMPLANT
CLIP APPLIE 5 13 M/L LIGAMAX5 (MISCELLANEOUS) IMPLANT
CLIP LIGATING HEMO LOK XL GOLD (MISCELLANEOUS) ×1 IMPLANT
CNTNR URN SCR LID CUP LEK RST (MISCELLANEOUS) IMPLANT
CONT SPEC 4OZ STRL OR WHT (MISCELLANEOUS) ×2
COVER SURGICAL LIGHT HANDLE (MISCELLANEOUS) ×1 IMPLANT
COVER TRANSDUCER ULTRASND (DRAPES) ×1 IMPLANT
DERMABOND ADVANCED .7 DNX12 (GAUZE/BANDAGES/DRESSINGS) ×1 IMPLANT
ELECT REM PT RETURN 9FT ADLT (ELECTROSURGICAL) ×1
ELECTRODE REM PT RTRN 9FT ADLT (ELECTROSURGICAL) ×1 IMPLANT
ENDOLOOP SUT PDS II  0 18 (SUTURE)
ENDOLOOP SUT PDS II 0 18 (SUTURE) IMPLANT
GLOVE BIO SURGEON STRL SZ7.5 (GLOVE) ×2 IMPLANT
GOWN STRL REUS W/ TWL LRG LVL3 (GOWN DISPOSABLE) ×2 IMPLANT
GOWN STRL REUS W/ TWL XL LVL3 (GOWN DISPOSABLE) ×1 IMPLANT
GOWN STRL REUS W/TWL LRG LVL3 (GOWN DISPOSABLE) ×2
GOWN STRL REUS W/TWL XL LVL3 (GOWN DISPOSABLE) ×1
GRASPER SUT TROCAR 14GX15 (MISCELLANEOUS) ×1 IMPLANT
IRRIG SUCT STRYKERFLOW 2 WTIP (MISCELLANEOUS) ×1
IRRIGATION SUCT STRKRFLW 2 WTP (MISCELLANEOUS) ×1 IMPLANT
KIT BASIN OR (CUSTOM PROCEDURE TRAY) ×1 IMPLANT
KIT TURNOVER KIT B (KITS) ×1 IMPLANT
NDL INSUFFLATION 14GA 120MM (NEEDLE) ×1 IMPLANT
NEEDLE INSUFFLATION 14GA 120MM (NEEDLE) ×1 IMPLANT
NS IRRIG 1000ML POUR BTL (IV SOLUTION) ×1 IMPLANT
PAD ARMBOARD 7.5X6 YLW CONV (MISCELLANEOUS) ×2 IMPLANT
SCISSORS LAP 5X35 DISP (ENDOMECHANICALS) ×1 IMPLANT
SET TUBE SMOKE EVAC HIGH FLOW (TUBING) ×1 IMPLANT
SLEEVE Z-THREAD 5X100MM (TROCAR) ×1 IMPLANT
SPECIMEN JAR SMALL (MISCELLANEOUS) ×1 IMPLANT
SUT MNCRL AB 4-0 PS2 18 (SUTURE) ×1 IMPLANT
TOWEL GREEN STERILE (TOWEL DISPOSABLE) ×1 IMPLANT
TOWEL GREEN STERILE FF (TOWEL DISPOSABLE) ×1 IMPLANT
TRAY FOLEY W/BAG SLVR 16FR (SET/KITS/TRAYS/PACK)
TRAY FOLEY W/BAG SLVR 16FR ST (SET/KITS/TRAYS/PACK) IMPLANT
TRAY LAPAROSCOPIC MC (CUSTOM PROCEDURE TRAY) ×1 IMPLANT
TROCAR 11X100 Z THREAD (TROCAR) ×1 IMPLANT
TROCAR Z-THREAD OPTICAL 5X100M (TROCAR) ×1 IMPLANT
WARMER LAPAROSCOPE (MISCELLANEOUS) ×1 IMPLANT
WATER STERILE IRR 1000ML POUR (IV SOLUTION) ×1 IMPLANT

## 2022-07-11 NOTE — ED Triage Notes (Signed)
Pt to ED from home. Pt c/o upper abd pain that has been going on since 3am. Pt has cardiac hx with bypass in 2008. Pt denies N/V/D. Pt AAOx4. Pt ambulatory.

## 2022-07-11 NOTE — Consult Note (Addendum)
[name] 1950-11-03  [id number].    Requesting MD: Nechama Guard, MD  Chief Complaint/Reason for Consult: acute appendicitis   HPI:  [name] is a 72 y/o M with a PMH Diverticulitis, HTN, CAD, seizure disorder, Vit D deficiency, prediabetes, and obesity who presents with a cc abdominal pain. States the pain woke him up from his sleep at 0300. Described as sharp abdominal pain that is non-radiating.  Associated sxs include nausea. Denies fever, chills, diarrhea, constipation, respiratory or urinary sxs. Tried tylenol without relief of sxs. He was diagnosed with sigmoid diverticulitis 05/17/2022 and completed 10 days of outpatient antibiotics for this. He tells me that his pain today is very different than the pain he had with diverticulitis which had completely resolved after he finished abx.    Employment: retired  Substance: former smoker - quit 1970's. Denies other [facility] or drug use Currently lives in [place] with his wife and is independent of ADLs.    ROS: As above Review of Systems  All other systems reviewed and are negative.   Family History  Problem Relation Age of Onset   Hypertension Mother    Stroke Mother    Diabetes Father    Lupus Sister     Past Medical History:  Diagnosis Date   Bilateral carpal tunnel syndrome    Dyslipidemia    Hyperlipidemia    Ischemic heart disease    Plantar fasciitis    Seizure disorder ([place])    Vitamin D deficiency     Past Surgical History:  Procedure Laterality Date   CHOLECYSTECTOMY     CORONARY ARTERY BYPASS GRAFT  08/2006   HAND SURGERY      Social History:  reports that he quit smoking about 54 years ago. His smoking use included cigarettes. He has never used smokeless tobacco. He reports that he does not drink alcohol and does not use drugs.  Allergies:  Allergies  Allergen Reactions   Lipitor [Atorvastatin]     myalgia    (Not in a hospital admission)    Physical Exam: Blood pressure 118/63,  pulse (!) 45, temperature 98.9 F (37.2 C), temperature source Oral, resp. rate 12, height 5' 8"$  (1.727 m), weight 89.8 kg, SpO2 98 %. PE: General: pleasant, WD, male who is laying in bed in NAD HEENT: head is normocephalic, atraumatic.  Sclera are noninjected.  Pupils equal and round. EOMs intact.  Ears and nose without any masses or lesions.  Mouth is pink and moist Heart: Palpable radial pulses bilaterally. HR in 50s Lungs: Respiratory effort nonlabored on room air Abd: soft, ND, +BS, no masses, hernias, or organomegaly. TTP in RLQ with guarding MSK: all 4 extremities are symmetrical with no cyanosis, clubbing, or edema. Skin: warm and dry with no masses, lesions, or rashes Neuro: Cranial nerves 2-12 grossly intact, sensation is normal throughout Psych: A&Ox3 with an appropriate affect.    Results for orders placed or performed during the hospital encounter of 07/11/22 (from the past 48 hour(s))  Comprehensive metabolic panel     Status: Abnormal   Collection Time: 07/11/22  7:15 AM  Result Value Ref Range   Sodium 139 135 - 145 mmol/L   Potassium 3.7 3.5 - 5.1 mmol/L   Chloride 104 98 - 111 mmol/L   CO2 26 22 - 32 mmol/L   Glucose, Bld 124 (H) 70 - 99 mg/dL    Comment: Glucose reference range applies only to samples taken after fasting for at least 8 hours.  BUN 18 8 - 23 mg/dL   Creatinine, Ser 1.01 0.61 - 1.24 mg/dL   Calcium 9.1 8.9 - 10.3 mg/dL   Total Protein 6.3 (L) 6.5 - 8.1 g/dL   Albumin 3.8 3.5 - 5.0 g/dL   AST 30 15 - 41 U/L   ALT 24 0 - 44 U/L   Alkaline Phosphatase 60 38 - 126 U/L   Total Bilirubin 0.7 0.3 - 1.2 mg/dL   GFR, Estimated >60 >60 mL/min    Comment: (NOTE) Calculated using the CKD-EPI Creatinine Equation (2021)    Anion gap 9 5 - 15    Comment: Performed at [facility] [address], [place], [place] [address]  CBC with Differential     Status: Abnormal   Collection Time: 07/11/22  7:15 AM  Result Value Ref Range   WBC 8.1 4.0 - 10.5  K/uL   RBC 4.43 4.22 - 5.81 MIL/uL   Hemoglobin 14.0 13.0 - 17.0 g/dL   HCT 39.7 39.0 - 52.0 %   MCV 89.6 80.0 - 100.0 fL   MCH 31.6 26.0 - 34.0 pg   MCHC 35.3 30.0 - 36.0 g/dL   RDW 11.9 11.5 - 15.5 %   Platelets 138 (L) 150 - 400 K/uL    Comment: REPEATED TO VERIFY   nRBC 0.0 0.0 - 0.2 %   Neutrophils Relative % 83 %   Neutro Abs 6.7 1.7 - 7.7 K/uL   Lymphocytes Relative 9 %   Lymphs Abs 0.8 0.7 - 4.0 K/uL   Monocytes Relative 7 %   Monocytes Absolute 0.6 0.1 - 1.0 K/uL   Eosinophils Relative 1 %   Eosinophils Absolute 0.1 0.0 - 0.5 K/uL   Basophils Relative 0 %   Basophils Absolute 0.0 0.0 - 0.1 K/uL   Immature Granulocytes 0 %   Abs Immature Granulocytes 0.02 0.00 - 0.07 K/uL    Comment: Performed at [facility], [address], [place], [place] [address]  Lipase, blood     Status: None   Collection Time: 07/11/22  7:15 AM  Result Value Ref Range   Lipase 33 11 - 51 U/L    Comment: Performed at [facility] [address], [place], [place] [address]  Troponin I (High Sensitivity)     Status: None   Collection Time: 07/11/22  7:15 AM  Result Value Ref Range   Troponin I (High Sensitivity) 8 <18 ng/L    Comment: (NOTE) Elevated high sensitivity troponin I (hsTnI) values and significant  changes across serial measurements may suggest ACS but many other  chronic and acute conditions are known to elevate hsTnI results.  Refer to the "Links" section for chest pain algorithms and additional  guidance. Performed at [facility], [place] [address] [place], [place] [address]   Troponin I (High Sensitivity)     Status: None   Collection Time: 07/11/22  9:20 AM  Result Value Ref Range   Troponin I (High Sensitivity) 8 <18 ng/L    Comment: (NOTE) Elevated high sensitivity troponin I (hsTnI) values and significant  changes across serial measurements may suggest ACS but many other  chronic and acute conditions are known to elevate hsTnI results.  Refer to the  "Links" section for chest pain algorithms and additional  guidance. Performed at [facility], [place] [address], [place],  [address]    CT ABDOMEN PELVIS W CONTRAST  Result Date: 07/11/2022 CLINICAL DATA:  Lower abdominal pain.  Recent diverticulitis EXAM: CT ABDOMEN AND PELVIS WITH CONTRAST TECHNIQUE: Multidetector CT imaging of the abdomen and pelvis was performed using the standard protocol following bolus administration of intravenous contrast. RADIATION DOSE REDUCTION: This exam was performed according to the departmental dose-optimization program which includes automated exposure control, adjustment of the mA and/or kV according to patient size and/or use of iterative reconstruction technique. CONTRAST:  58m OMNIPAQUE IOHEXOL 350 MG/ML SOLN COMPARISON:  None Available. FINDINGS: Lower chest: Lung bases are clear. Hepatobiliary: Small 10 mm lesion in the RIGHT hepatic lobe has simple fluid attenuation. Postcholecystectomy. Pancreas: Pancreas is normal. No ductal dilatation. No pancreatic inflammation. Spleen: Normal spleen Adrenals/urinary tract: Adrenal glands and kidneys are normal. The ureters and bladder normal. Stomach/Bowel: Stomach, duodenum and small bowel normal. Terminal ileum normal. The appendix is fluid-filled and mildly dilated to 10 mm (image 62/3). There is mild thickening of the deep peritoneal surface adjacent to the appendix. Appendix extends inferiorly and medial from the cecum in typical location. No perforation or abscess. Findings consistent with acute appendicitis. Multiple diverticula of the descending colon and sigmoid colon without acute inflammation. Vascular/Lymphatic: Abdominal aorta is normal caliber with atherosclerotic calcification. There is no retroperitoneal or periportal lymphadenopathy. No pelvic lymphadenopathy. Reproductive: Prostate unremarkable Other: No free fluid. Musculoskeletal: No aggressive osseous lesion. IMPRESSION: 1. Acute appendicitis. No  perforation or abscess. 2.  Aortic Atherosclerosis (ICD10-I70.0). Electronically Signed   By: SSuzy BouchardM.D.   On: 07/11/2022 09:08      Assessment/Plan Acute appendicitis without evidence of perforation or abscess - afebrile, VSS, WBC 8.1 - recommend laparoscopic appendectomy today.  - I have discussed the procedure and risks of appendectomy. The risks include but are not limited to bleeding, infection, wound problems, anesthesia, injury to intra-abdominal organs, possibility of postoperative ileus. He seems to understand and agrees with the plan.   FEN - NPO for OR ID - rocephin/flagyl  Possible DC from PACU  HTN CAD GERD   I reviewed nursing notes, last 24 h vitals and pain scores, last 48 h intake and output, last 24 h labs and trends, and last 24 h imaging results.  MWinferd Humphrey PHe[facility] 07/11/2022, 11:36 AM Please see Amion for pager number during day hours 7:00am-4:30pm

## 2022-07-11 NOTE — Anesthesia Procedure Notes (Signed)
Procedure Name: Intubation Date/Time: 07/11/2022 1:13 PM  Performed by: Georgia Duff, CRNAPre-anesthesia Checklist: Patient identified, Emergency Drugs available, Suction available and Patient being monitored Patient Re-evaluated:Patient Re-evaluated prior to induction Oxygen Delivery Method: Circle System Utilized Preoxygenation: Pre-oxygenation with 100% oxygen Induction Type: IV induction Ventilation: Mask ventilation without difficulty Laryngoscope Size: Miller and 2 Grade View: Grade I Tube type: Oral Tube size: 7.5 mm Number of attempts: 1 Airway Equipment and Method: Stylet and Oral airway Placement Confirmation: ETT inserted through vocal cords under direct vision, positive ETCO2 and breath sounds checked- equal and bilateral Secured at: 22 cm Tube secured with: Tape Dental Injury: Teeth and Oropharynx as per pre-operative assessment

## 2022-07-11 NOTE — Discharge Instructions (Signed)
[facility]  Please arrive at least 30 min before your appointment to complete your check in paperwork.  If you are unable to arrive 30 min prior to your appointment time we may have to cancel or reschedule you. LAPAROSCOPIC SURGERY: POST OP INSTRUCTIONS Always review your discharge instruction sheet given to you by the facility where your surgery was performed. IF YOU HAVE DISABILITY OR FAMILY LEAVE FORMS, YOU MUST BRING THEM TO THE OFFICE FOR PROCESSING.   DO NOT GIVE THEM TO YOUR DOCTOR.  PAIN CONTROL  First take acetaminophen (Tylenol) AND/or ibuprofen (Advil) to control your pain after surgery.  Follow directions on package.  Taking acetaminophen (Tylenol) and/or ibuprofen (Advil) regularly after surgery will help to control your pain and lower the amount of prescription pain medication you may need.  You should not take more than 4,000 mg (4 grams) of acetaminophen (Tylenol) in 24 hours.  You should not take ibuprofen (Advil), aleve, motrin, naprosyn or other NSAIDS if you have a history of stomach ulcers or chronic kidney disease.  A prescription for pain medication may be given to you upon discharge.  Take your pain medication as prescribed, if you still have uncontrolled pain after taking acetaminophen (Tylenol) or ibuprofen (Advil). Use ice packs to help control pain. If you need a refill on your pain medication, please contact your pharmacy.  They will contact our office to request authorization. Prescriptions will not be filled after 5pm or on week-ends.  HOME MEDICATIONS Take your usually prescribed medications unless otherwise directed.  DIET You should follow a light diet the first few days after arrival home.  Be sure to include lots of fluids daily. Avoid fatty, fried foods.   CONSTIPATION It is common to experience some constipation after surgery and if you are taking pain medication.  Increasing fluid intake and taking a stool softener (such as Colace)  will usually help or prevent this problem from occurring.  A mild laxative (Milk of Magnesia or Miralax) should be taken according to package instructions if there are no bowel movements after 48 hours.  WOUND/INCISION CARE Most patients will experience some swelling and bruising in the area of the incisions.  Ice packs will help.  Swelling and bruising can take several days to resolve.  Unless discharge instructions indicate otherwise, follow guidelines below  STERI-STRIPS - you may remove your outer bandages 48 hours after surgery, and you may shower at that time.  You have steri-strips (small skin tapes) in place directly over the incision.  These strips should be left on the skin for 7-10 days.   DERMABOND/SKIN GLUE - you may shower in 24 hours.  The glue will flake off over the next 2-3 weeks. Any sutures or staples will be removed at the office during your follow-up visit.  ACTIVITIES You may resume regular (light) daily activities beginning the next day--such as daily self-care, walking, climbing stairs--gradually increasing activities as tolerated.  You may have sexual intercourse when it is comfortable.  Refrain from any heavy lifting or straining until approved by your doctor. You may drive when you are no longer taking prescription pain medication, you can comfortably wear a seatbelt, and you can safely maneuver your car and apply brakes.  FOLLOW-UP You should see your doctor in the office for a follow-up appointment approximately 2-3 weeks after your surgery.  You should have been given your post-op/follow-up appointment when your surgery was scheduled.  If you did not receive a post-op/follow-up appointment, make sure   that you call for this appointment within a day or two after you arrive home to insure a convenient appointment time.   WHEN TO CALL YOUR DOCTOR: Fever over 101.0 Inability to urinate Continued bleeding from incision. Increased pain, redness, or drainage from the  incision. Increasing abdominal pain  The clinic staff is available to answer your questions during regular business hours.  Please don't hesitate to call and ask to speak to one of the nurses for clinical concerns.  If you have a medical emergency, go to the nearest emergency room or call 911.  A surgeon from [facility] is always on call at the hospital. [address], [address], [place], [place]  [address] ? [address] [address], [place], [place]   [address] [phone] ? [phone] ? FAX [phone]  

## 2022-07-11 NOTE — Anesthesia Postprocedure Evaluation (Signed)
Anesthesia Post Note  Patient: [name]  Procedure(s) Performed: APPENDECTOMY LAPAROSCOPIC     Patient location during evaluation: PACU Anesthesia Type: General Level of consciousness: awake and alert Pain management: pain level controlled Vital Signs Assessment: post-procedure vital signs reviewed and stable Respiratory status: spontaneous breathing, nonlabored ventilation and respiratory function stable Cardiovascular status: blood pressure returned to baseline and stable Postop Assessment: no apparent nausea or vomiting Anesthetic complications: no  No notable events documented.  Last Vitals:  Vitals:   07/11/22 1430 07/11/22 1445  BP: (!) 122/58 105/64  Pulse: 62 60  Resp: 12 12  Temp:  37.5 C  SpO2: 98% 96%    Last Pain:  Vitals:   07/11/22 1445  TempSrc:   PainSc: 4                  Lasundra Hascall,W. EDMOND

## 2022-07-11 NOTE — ED Notes (Signed)
Pt transported to [facility] Preop by NST. Accompanied by pt wife. Pt alert and oriented at time of transport. Surgical consent sent with pt. Belongings in wife's possession

## 2022-07-11 NOTE — Anesthesia Preprocedure Evaluation (Signed)
Anesthesia Evaluation  Patient identified by MRN, date of birth, ID band Patient awake    Reviewed: Allergy & Precautions, H&P , NPO status , Patient's Chart, lab work & pertinent test results  Airway Mallampati: II  TM Distance: >3 FB Neck ROM: Full    Dental no notable dental hx. (+) Teeth Intact, Dental Advisory Given   Pulmonary neg pulmonary ROS, former smoker   Pulmonary exam normal breath sounds clear to auscultation       Cardiovascular hypertension, Pt. on medications and Pt. on home beta blockers + CAD and + CABG  negative cardio ROS  Rhythm:Regular Rate:Normal     Neuro/Psych Seizures -, Well Controlled,  negative neurological ROS  negative psych ROS   GI/Hepatic Neg liver ROS,GERD  ,,  Endo/Other  negative endocrine ROS    Renal/GU negative Renal ROS  negative genitourinary   Musculoskeletal   Abdominal   Peds  Hematology negative hematology ROS (+)   Anesthesia Other Findings   Reproductive/Obstetrics negative OB ROS                             Anesthesia Physical Anesthesia Plan  ASA: 3  Anesthesia Plan: General   Post-op Pain Management: Ofirmev IV (intra-op)*   Induction: Intravenous, Rapid sequence and Cricoid pressure planned  PONV Risk Score and Plan: 3 and Ondansetron, Dexamethasone and Treatment may vary due to age or medical condition  Airway Management Planned: Oral ETT  Additional Equipment:   Intra-op Plan:   Post-operative Plan: Extubation in OR  Informed Consent: I have reviewed the patients History and Physical, chart, labs and discussed the procedure including the risks, benefits and alternatives for the proposed anesthesia with the patient or authorized representative who has indicated his/her understanding and acceptance.     Dental advisory given  Plan Discussed with: CRNA  Anesthesia Plan Comments:        Anesthesia Quick  Evaluation

## 2022-07-11 NOTE — Op Note (Signed)
07/11/2022  1:46 PM  PATIENT:  [name]  72 y.o. male  PRE-OPERATIVE DIAGNOSIS:  Acute Appendicitis  POST-OPERATIVE DIAGNOSIS:  Acute Appendicitis, non perforated  PROCEDURE:  Procedure(s): APPENDECTOMY LAPAROSCOPIC (N/A)  SURGEON:  Surgeon(s) and Role:    Ralene Ok, MD - Primary  ASSISTANTS: Garvin Fila, RNFA   ANESTHESIA:   local and general  EBL:  minimal   BLOOD ADMINISTERED:none  DRAINS: none   LOCAL MEDICATIONS USED:  BUPIVICAINE   SPECIMEN:  Source of Specimen:  appendix  DISPOSITION OF SPECIMEN:  PATHOLOGY  COUNTS:  YES  TOURNIQUET:  * No tourniquets in log *  DICTATION: .Dragon Dictation Complications: none  Counts: reported as correct x 2  Findings:  The patient had a acutely inflamed, non perforated appendix  Specimen: Appendix  Indications for procedure:  The patient is a 72 year old male with a history of periumbilical pain localized in the right lower quadrant patient had a CT scan which revealed signs consistent with acute appendicitis the patient back in for laparoscopic appendectomy.  Details of the procedure:The patient was taken back to the operating room. The patient was placed in supine position with bilateral SCDs in place. The patient was prepped and draped in the usual sterile fashion.  After appropriate anitbiotics were confirmed, a time-out was confirmed and all facts were verified.    A pneumoperitoneum of 14 mmHg was obtained via a Veress needle technique in the left lower quadrant quadrant.  A 5 mm trocar and 5 mm camera then placed intra-abdominally there is no injury to any intra-abdominal organs a 10 mm infraumbilical port was placed and direct visualization as was a 5 mm port in the suprapubic area.   The appendix was identified and seen to be non-perforated.  The appendix was cleaned down to the appendiceal base. The mesoappendix was then incised and the appendiceal artery was cauterized.  The the appendiceal  base was clean.  A gold hemoclip was placed proximallyx2 and one distally and the appendix was transected between these 2. A retrieval bag was then placed into the abdomen and the specimen placed in the bag. The appendiceal stump was cauterized. We evacuate the fluid from the pelvis until the effluent was clear.  The appendix and retrieval  bag was then retrieved via the supraumbilical port. #1 Vicryl was used to reapproximate the fascia at the umbilical port site x2. The skin was reapproximated all port sites 3-0 Monocryl subcuticular fashion. The skin was dressed with Dermabond.  The patient was awakened from general anesthesia was taken to recovery room in stable condition.      PLAN OF CARE: Discharge to home after PACU  PATIENT DISPOSITION:  PACU - hemodynamically stable.   Delay start of Pharmacological VTE agent (>24hrs) due to surgical blood loss or risk of bleeding: not applicable

## 2022-07-11 NOTE — ED Provider Notes (Signed)
[facility] Provider Note   CSN: [id number] Arrival date & time: 07/11/22  [id number]     History  Chief Complaint  Patient presents with   Abdominal Pain    [name] is a 72 y.o. male.  With PMH of HTN, HLD, CAD, GERD, seizure disorder, cholecystectomy, diverticulosis presenting with abdominal pain since 3 AM. Patient recently diagnosed with diverticulitis finished 10-day course of antibiotics.  He had been doing well and felt like his symptoms resolved.  However around 3 AM last night he woke up with severe lower and upper abdominal pain no with no radiation to the back associate with nausea.  He has had no fevers or chills, no vomiting, no diarrhea or constipation.  Bowel movements have been normal per patient.  Denies any history of kidney stones and has had no issues with urination.  He took Tylenol and acid reflux medicine without relief.  He denies any chest pain or shortness of breath or symptoms similar to previous heart attack like symptoms that led to his CABG.   Abdominal Pain      Home Medications Prior to Admission medications   Medication Sig Start Date End Date Taking? Authorizing Provider  metoprolol succinate (TOPROL-XL) 100 MG 24 hr tablet TAKE 1 TABLET BY MOUTH DAILY FOR BLOOD PRESSURE 02/14/22  Yes Cranford, Tonya, NP  oxyCODONE (OXY IR/ROXICODONE) 5 MG immediate release tablet Take 1 tablet (5 mg total) by mouth every 6 (six) hours as needed for up to 5 days for severe pain. 07/11/22 07/16/22 Yes Winferd Humphrey, PA-C  Acetaminophen (TYLENOL PO) Take by mouth.    [provider]  aspirin 81 MG tablet Take 81 mg by mouth daily.    [provider]  carbamazepine (CARBATROL) 300 MG 12 hr capsule Take  1 capsule  Daily  to Prevent  ' Absence Seizures ' 05/23/22   [name], MD  Cholecalciferol (VITAMIN D PO) Take 10,000 Units by mouth daily.    [provider]  ciprofloxacin (CIPRO) 500 MG  tablet Take 1 tablet  2 x /day with Meals for Diverticulitis 06/28/22   [name], MD  cyclobenzaprine (FLEXERIL) 10 MG tablet Take 10 mg by mouth every 8 (eight) hours as needed. 02/21/22   [provider]  dexamethasone (DECADRON) 4 MG tablet Take 1 tab 3 x day - 3 days, then 2 x day - 3 days, then 1 tab daily 07/01/22   [name], MD  finasteride (PROSCAR) 5 MG tablet Take 1 tablet daily for prostate 05/17/22   [name], MD  fish oil-omega-3 fatty acids 1000 MG capsule Take 1 g by mouth daily.    [provider]  LUTEIN PO Take by mouth daily.    [provider]  Magnesium 250 MG TABS Take 250 mg by mouth daily.    [provider]  metroNIDAZOLE (FLAGYL) 500 MG tablet Take  1 tablet  3 x /day   with Meals for Diverticulitis 06/28/22   [name], MD  rosuvastatin (CRESTOR) 20 MG tablet Take  1 tablet  Daily for Cholesterol                                      /  TAKE                                                 BY                                    MOUTH 04/24/22   [name], MD  tadalafil (CIALIS) 20 MG tablet Take  1/2-1 tablet  every 2-3 days  as needed for XXXX 01/02/22   [name], MD      Allergies    Lipitor [atorvastatin]    Review of Systems   Review of Systems  Gastrointestinal:  Positive for abdominal pain.    Physical Exam Updated Vital Signs BP 105/64 (BP Location: Right Arm)   Pulse 60   Temp 99.5 F (37.5 C)   Resp 12   Ht 5' 8"$  (1.727 m)   Wt 89.8 kg   SpO2 96%   BMI 30.11 kg/m  Physical Exam Constitutional: Alert and oriented.  Uncomfortable appearing but nontoxic Eyes: Conjunctivae are normal. ENT      Head: Normocephalic and atraumatic.      Nose: No congestion.      Mouth/Throat: Mucous membranes are moist.      Neck: No stridor. Cardiovascular: S1, S2, equal palpable DP and PT pulses.Warm and well  perfused. Respiratory: Normal respiratory effort. Breath sounds are normal. O2 sat 100 on RA Gastrointestinal: Mildly distended but soft, rebound and guarding in the left lower quadrant and right lower quadrant, mild tenderness in the upper abdominal region  musculoskeletal: Normal range of motion in all extremities. No pitting edema of lower extremities Neurologic: Normal speech and language.  No facial droop.  Moving all extremities equally.  Sensation grossly intact.  No gross focal neurologic deficits are appreciated. Skin: Skin is warm, dry and intact. No rash noted. Psychiatric: Mood and affect are normal. Speech and behavior are normal.  ED Results / Procedures / Treatments   Labs (all labs ordered are listed, but only abnormal results are displayed) Labs Reviewed  COMPREHENSIVE METABOLIC PANEL - Abnormal; Notable for the following components:      Result Value   Glucose, Bld 124 (*)    Total Protein 6.3 (*)    All other components within normal limits  CBC WITH DIFFERENTIAL/PLATELET - Abnormal; Notable for the following components:   Platelets 138 (*)    All other components within normal limits  LIPASE, BLOOD  SURGICAL PATHOLOGY  TROPONIN I (HIGH SENSITIVITY)  TROPONIN I (HIGH SENSITIVITY)    EKG EKG Interpretation  Date/Time:  Tuesday July 11 2022 06:59:03 EST Ventricular Rate:  59 PR Interval:  158 QRS Duration: 103 QT Interval:  429 QTC Calculation: 425 R Axis:   29 Text Interpretation: Sinus rhythm Confirmed by Georgina Snell [phone]) on 07/11/2022 7:06:45 AM  Radiology CT ABDOMEN PELVIS W CONTRAST  Result Date: 07/11/2022 CLINICAL DATA:  Lower abdominal pain.  Recent diverticulitis EXAM: CT ABDOMEN AND PELVIS WITH CONTRAST TECHNIQUE: Multidetector CT imaging of the abdomen and pelvis was performed using the standard protocol following bolus administration of intravenous contrast. RADIATION DOSE REDUCTION: This exam was performed according to the  departmental dose-optimization program which includes automated exposure control, adjustment of the mA and/or kV according to patient size and/or use of iterative reconstruction technique. CONTRAST:  39m OMNIPAQUE IOHEXOL 350 MG/ML SOLN COMPARISON:  None Available. FINDINGS: Lower chest: Lung bases are clear. Hepatobiliary: Small 10 mm lesion in the RIGHT hepatic lobe has simple fluid attenuation. Postcholecystectomy. Pancreas: Pancreas is normal. No ductal dilatation. No pancreatic inflammation. Spleen: Normal spleen Adrenals/urinary tract: Adrenal glands and kidneys are normal. The ureters and bladder normal. Stomach/Bowel: Stomach, duodenum and small bowel normal. Terminal ileum normal. The appendix is fluid-filled and mildly dilated to 10 mm (image 62/3). There is mild thickening of the deep peritoneal surface adjacent to the appendix. Appendix extends inferiorly and medial from the cecum in typical location. No perforation or abscess. Findings consistent with acute appendicitis. Multiple diverticula of the descending colon and sigmoid colon without acute inflammation. Vascular/Lymphatic: Abdominal aorta is normal caliber with atherosclerotic calcification. There is no retroperitoneal or periportal lymphadenopathy. No pelvic lymphadenopathy. Reproductive: Prostate unremarkable Other: No free fluid. Musculoskeletal: No aggressive osseous lesion. IMPRESSION: 1. Acute appendicitis. No perforation or abscess. 2.  Aortic Atherosclerosis (ICD10-I70.0). Electronically Signed   By: SSuzy BouchardM.D.   On: 07/11/2022 09:08    Procedures Procedures    Medications Ordered in ED Medications  Chlorhexidine Gluconate Cloth 2 % PADS 6 each (has no administration in time range)    And  Chlorhexidine Gluconate Cloth 2 % PADS 6 each (6 each Topical Given 07/11/22 1222)  cefTRIAXone (ROCEPHIN) 2 g in sodium chloride 0.9 % 100 mL IVPB (2 g Intravenous New Bag/Given 07/11/22 1332)    And  metroNIDAZOLE (FLAGYL) IVPB  500 mg (500 mg Intravenous New Bag/Given 07/11/22 1220)  lactated ringers infusion ( Intravenous New Bag/Given 07/11/22 1228)  sodium chloride 0.9 % with cefTRIAXone (ROCEPHIN) ADS Med (has no administration in time range)  fentaNYL (SUBLIMAZE) injection 25-50 mcg (50 mcg Intravenous Given 07/11/22 1428)  fentaNYL (SUBLIMAZE) 100 MCG/2ML injection (has no administration in time range)  ondansetron (ZOFRAN) injection 4 mg (4 mg Intravenous Given 07/11/22 0814)  HYDROmorphone (DILAUDID) injection 1 mg (1 mg Intravenous Given 07/11/22 0814)  iohexol (OMNIPAQUE) 350 MG/ML injection 75 mL (75 mLs Intravenous Contrast Given 07/11/22 0851)  morphine (PF) 4 MG/ML injection 4 mg (4 mg Intravenous Given 07/11/22 1059)  chlorhexidine (PERIDEX) 0.12 % solution 15 mL (15 mLs Mouth/Throat Given 07/11/22 1222)    Or  Oral care mouth rinse ( Mouth Rinse See Alternative 07/11/22 1222)  fentaNYL (SUBLIMAZE) 100 MCG/2ML injection (  Duplicate [id number]  acetaminophen (OFIRMEV) 10 MG/ML IV (1,000 mg  New Bag/Given 07/11/22 1407)    ED Course/ Medical Decision Making/ A&P Clinical Course as of 07/11/22 1921  Tue Jul 11, 2022  0802 EKG reviewed by me, sinus rhythm with no acute ST/T changes, reassuring hs trop 8, doubt ACS or cardiac pathology.  Lipase 33 within normal limits.  No transaminitis.  Platelets slightly low 138.  Normal white blood cell count 8.1. [VB]  0945 Reassessed patient, he notes pain has improved but starting to come back.  He has acute appendicitis on CT scan which I personally reviewed no evidence of free air or free fluid.  Spoke with MJana Halfof general surgery who will be down to evaluate patient. [VB]    Clinical Course User Index [VB] BElgie Congo MD   {                            Medical Decision Making RKARON BRACEYis a 72y.o. male.  With PMH of HTN, HLD, CAD, GERD, seizure  disorder, cholecystectomy, diverticulosis presenting with abdominal pain since 3 AM. Patient recently  diagnosed with diverticulitis finished 10-day course of antibiotics.    Based on patient's abdominal exam and recent diverticulitis status post antibiotics, concern for possible complication including but not limited to worsening diverticulitis, diverticulitis and perforation, possible abscess development among multiple other etiologies of pain. Would also consider possible appendicitis, nephrolithiasis or colitis. Unlikely dissection, no h/o aneurysm, no pulse or neurologic deficits.  EKG reviewed by me, sinus rhythm with no acute ST/T changes, reassuring hs trop 8, doubt ACS or cardiac pathology.  Lipase 33 within normal limits.  No transaminitis.  Platelets slightly low 138.  Normal white blood cell count 8.1.  I reviewed patient CTAP with IV contrast.  There was no evidence of free fluid.  However there was evidence of acute appendicitis.  Patient admitted to surgery for laparoscopic appendectomy.   Amount and/or Complexity of Data Reviewed Labs: ordered. Radiology: ordered.  Risk Prescription drug management. Decision regarding hospitalization.    Final Clinical Impression(s) / ED Diagnoses Final diagnoses:  Abdominal pain, unspecified abdominal location  Acute appendicitis with localized peritonitis, without perforation, abscess, or gangrene    Rx / DC Orders ED Discharge Orders          Ordered    Diet - low sodium heart healthy        07/11/22 1358    Increase activity slowly        07/11/22 1358    oxyCODONE (OXY IR/ROXICODONE) 5 MG immediate release tablet  Every 6 hours PRN        07/11/22 1358              Elgie Congo, MD 07/11/22 1921

## 2022-07-11 NOTE — Transfer of Care (Signed)
Immediate Anesthesia Transfer of Care Note  Patient: [name]  Procedure(s) Performed: APPENDECTOMY LAPAROSCOPIC  Patient Location: PACU  Anesthesia Type:General  Level of Consciousness: drowsy and patient cooperative  Airway & Oxygen Therapy: Patient Spontanous Breathing  Post-op Assessment: Report given to RN and Post -op Vital signs reviewed and stable  Post vital signs: Reviewed and stable  Last Vitals:  Vitals Value Taken Time  BP 136/57 07/11/22 1400  Temp    Pulse 73 07/11/22 1403  Resp 19 07/11/22 1403  SpO2 95 % 07/11/22 1403  Vitals shown include unvalidated device data.  Last Pain:  Vitals:   07/11/22 1214  TempSrc: Oral  PainSc: 3          Complications: No notable events documented.

## 2022-07-12 ENCOUNTER — Encounter (HOSPITAL_COMMUNITY): Payer: Self-pay | Admitting: General Surgery

## 2022-07-12 LAB — SURGICAL PATHOLOGY

## 2022-09-04 DIAGNOSIS — K5730 Diverticulosis of large intestine without perforation or abscess without bleeding: Secondary | ICD-10-CM | POA: Diagnosis not present

## 2022-09-04 DIAGNOSIS — R197 Diarrhea, unspecified: Secondary | ICD-10-CM | POA: Diagnosis not present

## 2022-09-04 DIAGNOSIS — Z86010 Personal history of colonic polyps: Secondary | ICD-10-CM | POA: Diagnosis not present

## 2022-10-03 DIAGNOSIS — M48062 Spinal stenosis, lumbar region with neurogenic claudication: Secondary | ICD-10-CM | POA: Diagnosis not present

## 2022-10-18 DIAGNOSIS — M48062 Spinal stenosis, lumbar region with neurogenic claudication: Secondary | ICD-10-CM | POA: Diagnosis not present

## 2022-10-21 ENCOUNTER — Other Ambulatory Visit: Payer: Self-pay | Admitting: Nurse Practitioner

## 2022-10-21 DIAGNOSIS — I10 Essential (primary) hypertension: Secondary | ICD-10-CM

## 2022-11-02 NOTE — Progress Notes (Signed)
Assessment and Plan: [name] was seen today for covid exposure.  Diagnoses and all orders for this visit:  Essential hypertension - continue medications, DASH diet, exercise and monitor at home. Call if greater than 130/80.   Encounter for screening for COVID-19 -     POC COVID-19  COVID -     dexamethasone (DECADRON) 2 MG tablet; Take 3 tabs for 2 days, 2 tabs for 2 days 1 tab for 3 days. Take with food. -     promethazine-dextromethorphan (PROMETHAZINE-DM) 6.25-15 MG/5ML syrup; Take 5 mLs by mouth 4 (four) times daily as needed for cough Take tylenol PRN temp 101+ Push hydration Regular ambulation or calf exercises exercises for clot prevention and 81 mg ASA unless contraindicated Sx supportive therapy suggested Follow up via mychart or telephone if needed Advised patient obtain O2 monitor; present to ED if persistently <90% or with severe dyspnea, CP, fever uncontrolled by tylenol, confusion, sudden decline Should remain in isolation until at least 5 days from onset of sx, 24-48 hours fever free without tylenol, sx such as cough are improved.     Further disposition pending results of labs. Discussed med's effects and SE's.   Over 30 minutes of exam, counseling, chart review, and critical decision making was performed.   Future Appointments  Date Time Provider Department Center  02/13/2023 10:00 AM Lucky Cowboy, MD [facility] None    ------------------------------------------------------------------------------------------------------------------   HPI BP 110/62   Pulse (!) 48   Temp (!) 97.5 F (36.4 C)   Ht 5' 7.5" (1.715 m)   Wt 193 lb 12.8 oz (87.9 kg)   SpO2 97%   BMI 29.91 kg/m   72 y.o.male presents for Covid positive on the 20th but made appointment today for wellness.  Covid test repeated and is positive.  He does have a productive cough of clear mucus. He initially had a fever but has resolved. Denies sinus pressure, headache, body aches, no loss of taste  and smell. He has been using Nyquil at night.    His BP is currently controlled with metoprolol 100 mg every day.  Denies headaches, chest pain, shortness of breath and dizziness BP Readings from Last 3 Encounters:  11/13/22 110/62  07/11/22 105/64  06/28/22 134/70    Past Medical History:  Diagnosis Date   Bilateral carpal tunnel syndrome    Dyslipidemia    Hyperlipidemia    Ischemic heart disease    Plantar fasciitis    Seizure disorder (HCC)    Vitamin D deficiency      Allergies  Allergen Reactions   Lipitor [Atorvastatin]     myalgia    Current Outpatient Medications on File Prior to Visit  Medication Sig   Acetaminophen (TYLENOL PO) Take by mouth.   aspirin 81 MG tablet Take 81 mg by mouth daily.   carbamazepine (CARBATROL) 300 MG 12 hr capsule Take  1 capsule  Daily  to Prevent  ' Absence Seizures '   Cholecalciferol (VITAMIN D PO) Take 10,000 Units by mouth daily.   cyclobenzaprine (FLEXERIL) 10 MG tablet Take 10 mg by mouth every 8 (eight) hours as needed.   finasteride (PROSCAR) 5 MG tablet Take 1 tablet daily for prostate   fish oil-omega-3 fatty acids 1000 MG capsule Take 1 g by mouth daily.   LUTEIN PO Take by mouth daily.   Magnesium 250 MG TABS Take 250 mg by mouth daily.   metoprolol succinate (TOPROL-XL) 100 MG 24 hr tablet Take  1 tablet  Daily for  BP & Heart                                                             /                                     TAKE                                                       BY                                                        MOUTH   Multiple Vitamins-Minerals (ZINC PO) Take by mouth.   rosuvastatin (CRESTOR) 20 MG tablet Take  1 tablet  Daily for Cholesterol                                      /                                                                        TAKE                                                 BY                                    MOUTH   tadalafil (CIALIS) 20 MG tablet Take  1/2-1 tablet   every 2-3 days  as needed for XXXX   ciprofloxacin (CIPRO) 500 MG tablet Take 1 tablet  2 x /day with Meals for Diverticulitis (Patient not taking: Reported on 11/13/2022)   dexamethasone (DECADRON) 4 MG tablet Take 1 tab 3 x day - 3 days, then 2 x day - 3 days, then 1 tab daily (Patient not taking: Reported on 11/13/2022)   metroNIDAZOLE (FLAGYL) 500 MG tablet Take  1 tablet  3 x /day   with Meals for Diverticulitis (Patient not taking: Reported on 11/13/2022)   No current facility-administered medications on file prior to visit.    ROS: all negative except above.   Physical Exam:  BP 110/62   Pulse (!) 48   Temp (!) 97.5 F (36.4 C)   Ht 5' 7.5" (1.715 m)   Wt 193 lb 12.8 oz (87.9 kg)   SpO2 97%   BMI 29.91 kg/m  General Appearance: Well nourished, in no apparent distress. Eyes: PERRLA, EOMs, conjunctiva no swelling or erythema Sinuses: No Frontal/maxillary tenderness ENT/Mouth: Ext aud canals clear, TMs without erythema, bulging. No erythema, swelling, or exudate on post pharynx.  Tonsils not swollen or erythematous. Hearing normal.  Neck: Supple, thyroid normal.  Respiratory: Respiratory effort normal, BS equal bilaterally without rales, rhonchi, wheezing or stridor.  Cardio: RRR with no MRGs. Brisk peripheral pulses without edema.  Abdomen: Soft, + BS.  Lymphatics: Non tender without lymphadenopathy.  Musculoskeletal: Full ROM, 5/5 strength, normal gait.  Skin: Warm, dry without rashes, lesions, ecchymosis.  Psych: Awake and oriented X 3, normal affect, Insight and Judgment appropriate.     Raynelle Dick, NP 9:53 AM [facility]

## 2022-11-13 ENCOUNTER — Other Ambulatory Visit: Payer: Self-pay

## 2022-11-13 ENCOUNTER — Encounter: Payer: Self-pay | Admitting: Nurse Practitioner

## 2022-11-13 ENCOUNTER — Ambulatory Visit (INDEPENDENT_AMBULATORY_CARE_PROVIDER_SITE_OTHER): Payer: Medicare Other | Admitting: Nurse Practitioner

## 2022-11-13 VITALS — BP 110/62 | HR 48 | Temp 97.5°F | Ht 67.5 in | Wt 193.8 lb

## 2022-11-13 DIAGNOSIS — Z1152 Encounter for screening for COVID-19: Secondary | ICD-10-CM | POA: Diagnosis not present

## 2022-11-13 DIAGNOSIS — Z0000 Encounter for general adult medical examination without abnormal findings: Secondary | ICD-10-CM

## 2022-11-13 DIAGNOSIS — I10 Essential (primary) hypertension: Secondary | ICD-10-CM | POA: Diagnosis not present

## 2022-11-13 DIAGNOSIS — Z86010 Personal history of colonic polyps: Secondary | ICD-10-CM

## 2022-11-13 DIAGNOSIS — K219 Gastro-esophageal reflux disease without esophagitis: Secondary | ICD-10-CM

## 2022-11-13 DIAGNOSIS — R7309 Other abnormal glucose: Secondary | ICD-10-CM

## 2022-11-13 DIAGNOSIS — I25810 Atherosclerosis of coronary artery bypass graft(s) without angina pectoris: Secondary | ICD-10-CM

## 2022-11-13 DIAGNOSIS — Z79899 Other long term (current) drug therapy: Secondary | ICD-10-CM

## 2022-11-13 DIAGNOSIS — G40909 Epilepsy, unspecified, not intractable, without status epilepticus: Secondary | ICD-10-CM

## 2022-11-13 DIAGNOSIS — E782 Mixed hyperlipidemia: Secondary | ICD-10-CM

## 2022-11-13 DIAGNOSIS — E669 Obesity, unspecified: Secondary | ICD-10-CM

## 2022-11-13 DIAGNOSIS — E559 Vitamin D deficiency, unspecified: Secondary | ICD-10-CM

## 2022-11-13 DIAGNOSIS — U071 COVID-19: Secondary | ICD-10-CM

## 2022-11-13 LAB — POC COVID19 BINAXNOW: SARS Coronavirus 2 Ag: POSITIVE — AB

## 2022-11-13 MED ORDER — DEXAMETHASONE 2 MG PO TABS: ORAL_TABLET | ORAL | 0 refills | Status: DC

## 2022-11-13 MED ORDER — PROMETHAZINE-DM 6.25-15 MG/5ML PO SYRP: 5.0000 mL | ORAL_SOLUTION | Freq: Four times a day (QID) | ORAL | 1 refills | Status: DC | PRN

## 2022-11-13 NOTE — Patient Instructions (Signed)
Take tylenol PRN temp 101+ Push hydration Regular ambulation or calf exercises exercises for clot prevention and 81 mg ASA unless contraindicated Sx supportive therapy suggested Follow up via mychart or telephone if needed Advised patient obtain O2 monitor; present to ED if persistently <90% or with severe dyspnea, CP, fever uncontrolled by tylenol, confusion, sudden decline Should remain in isolation until at least 5 days from onset of sx, 24-48 hours fever free without tylenol, sx such as cough are improved.  

## 2022-11-20 DIAGNOSIS — M48062 Spinal stenosis, lumbar region with neurogenic claudication: Secondary | ICD-10-CM | POA: Diagnosis not present

## 2022-11-22 ENCOUNTER — Other Ambulatory Visit: Payer: Self-pay | Admitting: Nurse Practitioner

## 2022-11-22 ENCOUNTER — Telehealth: Payer: Self-pay | Admitting: Nurse Practitioner

## 2022-11-22 DIAGNOSIS — G40909 Epilepsy, unspecified, not intractable, without status epilepticus: Secondary | ICD-10-CM

## 2022-11-22 MED ORDER — CARBAMAZEPINE ER 300 MG PO CP12: ORAL_CAPSULE | ORAL | 3 refills | Status: DC

## 2022-11-22 NOTE — Telephone Encounter (Signed)
Pt. Has been getting Carbamazepine from [employer]. He was wondering if you could fill that script and send it through the [facility] mail service, bc [employer] is not selling it anymore. If possible, could you make the script for 100 days, instead of 90? He said his insurance will pay for it.

## 2022-12-19 DIAGNOSIS — Z86010 Personal history of colonic polyps: Secondary | ICD-10-CM | POA: Diagnosis not present

## 2022-12-19 DIAGNOSIS — K5730 Diverticulosis of large intestine without perforation or abscess without bleeding: Secondary | ICD-10-CM | POA: Diagnosis not present

## 2022-12-19 DIAGNOSIS — Z1211 Encounter for screening for malignant neoplasm of colon: Secondary | ICD-10-CM | POA: Diagnosis not present

## 2022-12-19 LAB — HM COLONOSCOPY

## 2022-12-21 DIAGNOSIS — M791 Myalgia, unspecified site: Secondary | ICD-10-CM | POA: Diagnosis not present

## 2022-12-22 ENCOUNTER — Encounter: Payer: Medicare Other | Admitting: Internal Medicine

## 2022-12-25 ENCOUNTER — Encounter: Payer: Medicare Other | Admitting: Internal Medicine

## 2022-12-26 ENCOUNTER — Encounter: Payer: Self-pay | Admitting: Internal Medicine

## 2023-01-02 ENCOUNTER — Other Ambulatory Visit: Payer: Self-pay | Admitting: Internal Medicine

## 2023-01-16 DIAGNOSIS — M5441 Lumbago with sciatica, right side: Secondary | ICD-10-CM | POA: Diagnosis not present

## 2023-02-01 DIAGNOSIS — M48062 Spinal stenosis, lumbar region with neurogenic claudication: Secondary | ICD-10-CM | POA: Diagnosis not present

## 2023-02-06 DIAGNOSIS — M48062 Spinal stenosis, lumbar region with neurogenic claudication: Secondary | ICD-10-CM | POA: Diagnosis not present

## 2023-02-12 ENCOUNTER — Encounter: Payer: Self-pay | Admitting: Internal Medicine

## 2023-02-12 NOTE — Progress Notes (Addendum)
Annual  Screening/Preventative Visit  & Comprehensive Evaluation & Examination   Future Appointments  Date Time Provider Department  02/13/2023                          cpe 10:00 AM Lucky Cowboy, MD [facility]  11/15/2023                             9:30 AM Raynelle Dick, NP [facility]  02/22/2024                           cpe 10:00 AM Lucky Cowboy, MD [facility]         This very nice 72 y.o. MWM with  HTN, ASCAD/CABG, HLD, Prediabetes and Vitamin D Deficiency presents for a Screening /Preventative Visit & comprehensive evaluation and management of multiple medical co-morbidities.   Patient has remote hx/o Seizures (1987) manifest as "Absence Seizures" & controlled on Carbamazepine (Carbatrol) .       Patient also reports recent exacerbation of LB pain with radiation to both posterior thighs. He's being followed by Dr Lelon Perla / [facility] .        HTN predates  circa 2004. Patient's BP has been controlled and today's BP is at goal - 136/80.   In 2008,  patient underwent emergent CABG and has done well since.  Patient denies any cardiac symptoms as chest pain, palpitations, shortness of breath, dizziness or ankle swelling.        Patient's hyperlipidemia is controlled with diet and Rosuvastatin.  Patient denies myalgias or other medication SE's. Last lipids were at goal :  Lab Results  Component Value Date   CHOL 128 06/28/2022   HDL 44 06/28/2022   LDLCALC 62 06/28/2022   TRIG 140 06/28/2022   CHOLHDL 2.9 06/28/2022         Patient has hx/o prediabetes ("A1c 5.7% /2014) and patient denies reactive hypoglycemic symptoms, visual blurring, diabetic polys or paresthesias. Last A1c was not at goal :   Lab Results  Component Value Date   HGBA1C 5.9 (H) 06/28/2022         Finally, patient has history of Vitamin D Deficiency ("24" / 2008) and last vitamin D was at goal :   Lab Results  Component Value Date   VD25OH 81 06/28/2022        Current Outpatient Medications  Medication Instructions   Acetaminophen  As needed   Aspirin     81 mg Daily   carbamazepine  300 MG 12 hr cap Take  1 capsule  Daily  to Prevent  ' Absence Seizures '   VITAMIN D   10,000 Units  Daily   Ciprofloxacin 500 MG tablet Take 1 tablet  2 x /day with Meals for Diverticulitis   cyclobenzaprine 10 mg  Every 8 hours PRN   finasteride 5 MG tablet Take 1 tablet daily for prostate   fish oil-omega-3   1 g Daily   LUTEIN  Daily   Magnesium250 mg Daily   metoprolol succinate -XL 100 MG Take  1 tablet  Daily    metroNIDAZOLE  500 MG tab Take  1 tablet  3 x /day with Meals for Diverticulitis   Multiple Vitamins-Minerals  Daily   rosuvastatin 20 MG tablet TAKE 1 TABLET  DAILY    tadalafil 20 MG tablet  Take  1/2-1 tablet  every 2-3 days  as needed      Allergies  Allergen Reactions   Lipitor [Atorvastatin]     myalgia     Past Medical History:  Diagnosis Date   Bilateral carpal tunnel syndrome    Dyslipidemia    Hyperlipidemia    Ischemic heart disease    Plantar fasciitis    Seizure disorder (HCC)    Vitamin D deficiency      Health Maintenance  Topic Date Due   Zoster Vaccines- Shingrix (1 of 2) Never done   INFLUENZA VACCINE  12/20/2020   COVID-19 Vaccine (5 - Booster for Pfizer series) 12/25/2020   TETANUS/TDAP  07/16/2029   Hepatitis C Screening  Completed   HPV VACCINES  Aged Out   PNA vac Low Risk Adult  Discontinued     Immunization History  Administered Date(s) Administered   Influenza, High Dose  03/28/2018, 02/27/2019, 05/04/2020   Moderna SARS-COV2 Booster Vacc 08/25/2020   PFIZER SARS-COV-2 Vacc 06/26/2019, 08/04/2019, 02/26/2020   Pneumococcal-13 12/17/2015   Pneumococcal -23 04/22/2009   Td 04/22/2009   Tdap 07/17/2019   Zoster, Live 09/09/2009    Last Colon - 09/11/2018 - Dr Elnoria Howard - recc f/u Colon -   Past Surgical History:  Procedure Laterality Date   CHOLECYSTECTOMY     CORONARY ARTERY BYPASS  GRAFT  08/2006   HAND SURGERY       Family History  Problem Relation Age of Onset   Hypertension Mother    Stroke Mother    Diabetes Father    Lupus Sister     Social History   Socioeconomic History   Marital status: Married    Spouse name: Bonita Quin   Number of children: 2  Occupational History   Occupation: Licensed Sales executive ( Retired )  Tobacco Use   Smoking status: Former    Types: Cigarettes    Quit date: 05/22/1968    Years since quitting: 52.6   Smokeless tobacco: Never  Substance and Sexual Activity   Alcohol use: No    Alcohol/week: 0.0 standard drinks    Comment: very rarely   Drug use: No   Sexual activity: Not on file      ROS Constitutional: Denies fever, chills, weight loss/gain, headaches, insomnia,  night sweats or change in appetite. Does c/o fatigue. Eyes: Denies redness, blurred vision, diplopia, discharge, itchy or watery eyes.  ENT: Denies discharge, congestion, post nasal drip, epistaxis, sore throat, earache, hearing loss, dental pain, Tinnitus, Vertigo, Sinus pain or snoring.  Cardio: Denies chest pain, palpitations, irregular heartbeat, syncope, dyspnea, diaphoresis, orthopnea, PND, claudication or edema Respiratory: denies cough, dyspnea, DOE, pleurisy, hoarseness, laryngitis or wheezing.  Gastrointestinal: Denies dysphagia, heartburn, reflux, water brash, pain, cramps, nausea, vomiting, bloating, diarrhea, constipation, hematemesis, melena, hematochezia, jaundice or hemorrhoids Genitourinary: Denies dysuria, frequency, urgency, nocturia, hesitancy, discharge, hematuria or flank pain Musculoskeletal: Denies arthralgia, myalgia, stiffness, Jt. Swelling, pain, limp or strain/sprain. Denies Falls. Skin: Denies puritis, rash, hives, warts, acne, eczema or change in skin lesion Neuro: No weakness, tremor, incoordination, spasms, paresthesia or pain Psychiatric: Denies confusion, memory loss or sensory loss. Denies Depression. Endocrine: Denies change in  weight, skin, hair change, nocturia, and paresthesia, diabetic polys, visual blurring or hyper / hypo glycemic episodes.  Heme/Lymph: No excessive bleeding, bruising or enlarged lymph nodes.   Physical Exam  BP 136/80   Pulse 76   Temp 97.9 F (36.6 C)   Resp 17   Ht 5' 7.5" (1.715 m)  Wt 201 lb 6.4 oz (91.4 kg)   SpO2 96%   BMI 31.08 kg/m    General Appearance: Well nourished and well groomed and in no apparent distress.  Eyes: PERRLA, EOMs, conjunctiva no swelling or erythema, normal fundi and vessels. Sinuses: No frontal/maxillary tenderness ENT/Mouth: EACs patent / TMs  nl. Nares clear without erythema, swelling, mucoid exudates. Oral hygiene is good. No erythema, swelling, or exudate. Tongue normal, non-obstructing. Tonsils not swollen or erythematous. Hearing normal.  Neck: Supple, thyroid not palpable. No bruits, nodes or JVD. Respiratory: Respiratory effort normal.  BS equal and clear bilateral without rales, rhonci, wheezing or stridor. Cardio: Heart sounds are normal with regular rate and rhythm and no murmurs, rubs or gallops. Peripheral pulses are normal and equal bilaterally without edema. No aortic or femoral bruits. Chest: symmetric with normal excursions and percussion.  Abdomen: Soft, with Nl bowel sounds. Nontender, no guarding, rebound, hernias, masses, or organomegaly.  Lymphatics: Non tender without lymphadenopathy.  Musculoskeletal: Full ROM all peripheral extremities, joint stability, 5/5 strength, and normal gait. Skin: Warm and dry without rashes, lesions, cyanosis, clubbing or  ecchymosis.  Neuro: Cranial nerves intact, reflexes equal bilaterally. Normal muscle tone, no cerebellar symptoms. Sensation intact.  Pysch: Alert and oriented X 3 with normal affect, insight and judgment appropriate.    Assessment and Plan  1. Annual Preventative/Screening Exam    2. Essential hypertension  - EKG 12-Lead - [place], RETROPERITNL ABD,  LTD - Urinalysis, Routine  w reflex microscopic - Microalbumin / creatinine urine ratio - CBC with Differential/Platelet - COMPLETE METABOLIC PANEL WITH GFR - Magnesium - TSH   3. Hyperlipidemia, mixed  - EKG 12-Lead - [place], RETROPERITNL ABD,  LTD - Lipid panel - TSH   4. Abnormal glucose  - EKG 12-Lead - [place], RETROPERITNL ABD,  LTD - Hemoglobin A1c - Insulin, random   5. Vitamin D deficiency  - VITAMIN D 25 Hydroxy    6. Seizure disorder (HCC)  - Carbamazepine level, total   7. BPH with obstruction/lower urinary tract symptoms  - PSA   8. Coronary artery disease involving coronary bypass                                                       graft of native heart without angina pectoris  - EKG 12-Lead   9. Screening for colorectal cancer   10. Prostate cancer screening  - PSA   11. Screening for heart disease  - EKG 12-Lead   12. FH: hypertension  - EKG 12-Lead - [place], RETROPERITNL ABD,  LTD   13. Former smoker  - EKG 12-Lead - [place], RETROPERITNL ABD,  LTD   14. Screening for AAA (aortic abdominal aneurysm)  - [place], RETROPERITNL ABD,  LTD   15. Medication management  - Urinalysis, Routine w reflex microscopic - Microalbumin / creatinine urine ratio - CBC with Differential/Platelet - COMPLETE METABOLIC PANEL WITH GFR - Magnesium - Lipid panel - TSH - Hemoglobin A1c - Insulin, random - VITAMIN D 25 Hydroxy  - Carbamazepine level, total           Patient was counseled in prudent diet, weight control to achieve/maintain BMI less than 25, BP monitoring, regular exercise and medications as discussed.  Discussed med effects and SE's. Routine screening labs and tests as requested with regular follow-up as  recommended. Over 40 minutes of exam, counseling, chart review and high complex critical decision making was performed   Marinus Maw, MD

## 2023-02-12 NOTE — Patient Instructions (Signed)

## 2023-02-13 ENCOUNTER — Ambulatory Visit (INDEPENDENT_AMBULATORY_CARE_PROVIDER_SITE_OTHER): Payer: Medicare Other | Admitting: Internal Medicine

## 2023-02-13 ENCOUNTER — Encounter: Payer: Self-pay | Admitting: Internal Medicine

## 2023-02-13 VITALS — BP 136/80 | HR 76 | Temp 97.9°F | Resp 17 | Ht 67.5 in | Wt 201.4 lb

## 2023-02-13 DIAGNOSIS — I700 Atherosclerosis of aorta: Secondary | ICD-10-CM

## 2023-02-13 DIAGNOSIS — I25810 Atherosclerosis of coronary artery bypass graft(s) without angina pectoris: Secondary | ICD-10-CM

## 2023-02-13 DIAGNOSIS — E782 Mixed hyperlipidemia: Secondary | ICD-10-CM

## 2023-02-13 DIAGNOSIS — Z8249 Family history of ischemic heart disease and other diseases of the circulatory system: Secondary | ICD-10-CM

## 2023-02-13 DIAGNOSIS — Z87891 Personal history of nicotine dependence: Secondary | ICD-10-CM

## 2023-02-13 DIAGNOSIS — Z136 Encounter for screening for cardiovascular disorders: Secondary | ICD-10-CM | POA: Diagnosis not present

## 2023-02-13 DIAGNOSIS — N401 Enlarged prostate with lower urinary tract symptoms: Secondary | ICD-10-CM

## 2023-02-13 DIAGNOSIS — R7309 Other abnormal glucose: Secondary | ICD-10-CM

## 2023-02-13 DIAGNOSIS — N138 Other obstructive and reflux uropathy: Secondary | ICD-10-CM

## 2023-02-13 DIAGNOSIS — E559 Vitamin D deficiency, unspecified: Secondary | ICD-10-CM

## 2023-02-13 DIAGNOSIS — I10 Essential (primary) hypertension: Secondary | ICD-10-CM

## 2023-02-13 DIAGNOSIS — Z79899 Other long term (current) drug therapy: Secondary | ICD-10-CM

## 2023-02-13 DIAGNOSIS — Z125 Encounter for screening for malignant neoplasm of prostate: Secondary | ICD-10-CM

## 2023-02-13 DIAGNOSIS — Z1211 Encounter for screening for malignant neoplasm of colon: Secondary | ICD-10-CM

## 2023-02-13 DIAGNOSIS — Z0000 Encounter for general adult medical examination without abnormal findings: Secondary | ICD-10-CM | POA: Diagnosis not present

## 2023-02-13 DIAGNOSIS — Z0001 Encounter for general adult medical examination with abnormal findings: Secondary | ICD-10-CM

## 2023-02-13 DIAGNOSIS — M48061 Spinal stenosis, lumbar region without neurogenic claudication: Secondary | ICD-10-CM

## 2023-02-13 DIAGNOSIS — G40909 Epilepsy, unspecified, not intractable, without status epilepticus: Secondary | ICD-10-CM

## 2023-02-13 MED ORDER — MELOXICAM 15 MG PO TABS: ORAL_TABLET | ORAL | 3 refills | Status: DC

## 2023-02-13 MED ORDER — GABAPENTIN 100 MG PO CAPS: ORAL_CAPSULE | ORAL | 1 refills | Status: DC

## 2023-02-14 LAB — URINALYSIS, ROUTINE W REFLEX MICROSCOPIC
Bilirubin Urine: NEGATIVE
Glucose, UA: NEGATIVE
Hgb urine dipstick: NEGATIVE
Ketones, ur: NEGATIVE
Leukocytes,Ua: NEGATIVE
Nitrite: NEGATIVE
Protein, ur: NEGATIVE
Specific Gravity, Urine: 1.013 (ref 1.001–1.035)
pH: 5.5 (ref 5.0–8.0)

## 2023-02-14 LAB — LIPID PANEL
Cholesterol: 155 mg/dL (ref <200)
HDL: 45 mg/dL (ref >=40)
LDL Cholesterol (Calc): 78 mg/dL (calc)
Non-HDL Cholesterol (Calc): 110 mg/dL (calc) (ref <130)
Total CHOL/HDL Ratio: 3.4 (calc) (ref <5.0)
Triglycerides: 231 mg/dL — ABNORMAL HIGH (ref <150)

## 2023-02-14 LAB — COMPLETE METABOLIC PANEL WITH GFR
AG Ratio: 1.9 (calc) (ref 1.0–2.5)
ALT: 13 U/L (ref 9–46)
AST: 13 U/L (ref 10–35)
Albumin: 4.5 g/dL (ref 3.6–5.1)
Alkaline phosphatase (APISO): 55 U/L (ref 35–144)
BUN: 20 mg/dL (ref 7–25)
CO2: 29 mmol/L (ref 20–32)
Calcium: 9.6 mg/dL (ref 8.6–10.3)
Chloride: 103 mmol/L (ref 98–110)
Creat: 0.87 mg/dL (ref 0.70–1.28)
Globulin: 2.4 g/dL (calc) (ref 1.9–3.7)
Glucose, Bld: 107 mg/dL — ABNORMAL HIGH (ref 65–99)
Potassium: 3.8 mmol/L (ref 3.5–5.3)
Sodium: 140 mmol/L (ref 135–146)
Total Bilirubin: 0.5 mg/dL (ref 0.2–1.2)
Total Protein: 6.9 g/dL (ref 6.1–8.1)
eGFR: 91 mL/min/{1.73_m2} (ref >=60)

## 2023-02-14 LAB — CBC WITH DIFFERENTIAL/PLATELET
Absolute Monocytes: 392 cells/uL (ref 200–950)
Basophils Absolute: 32 cells/uL (ref 0–200)
Basophils Relative: 0.8 %
Eosinophils Absolute: 92 cells/uL (ref 15–500)
Eosinophils Relative: 2.3 %
HCT: 43.1 % (ref 38.5–50.0)
Hemoglobin: 14.4 g/dL (ref 13.2–17.1)
Lymphs Abs: 1352 cells/uL (ref 850–3900)
MCH: 31.4 pg (ref 27.0–33.0)
MCHC: 33.4 g/dL (ref 32.0–36.0)
MCV: 93.9 fL (ref 80.0–100.0)
MPV: 9.4 fL (ref 7.5–12.5)
Monocytes Relative: 9.8 %
Neutro Abs: 2132 cells/uL (ref 1500–7800)
Neutrophils Relative %: 53.3 %
Platelets: 174 10*3/uL (ref 140–400)
RBC: 4.59 10*6/uL (ref 4.20–5.80)
RDW: 12.4 % (ref 11.0–15.0)
Total Lymphocyte: 33.8 %
WBC: 4 10*3/uL (ref 3.8–10.8)

## 2023-02-14 LAB — INSULIN, RANDOM: Insulin: 14.2 u[IU]/mL

## 2023-02-14 LAB — MAGNESIUM: Magnesium: 1.9 mg/dL (ref 1.5–2.5)

## 2023-02-14 LAB — VITAMIN D 25 HYDROXY (VIT D DEFICIENCY, FRACTURES): Vit D, 25-Hydroxy: 75 ng/mL (ref 30–100)

## 2023-02-14 LAB — CARBAMAZEPINE LEVEL, TOTAL: Carbamazepine Lvl: 5.5 mg/L (ref 4.0–12.0)

## 2023-02-14 LAB — TSH: TSH: 3.92 mIU/L (ref 0.40–4.50)

## 2023-02-14 LAB — HEMOGLOBIN A1C
Hgb A1c MFr Bld: 5.8 % of total Hgb — ABNORMAL HIGH (ref <5.7)
Mean Plasma Glucose: 120 mg/dL
eAG (mmol/L): 6.6 mmol/L

## 2023-02-14 LAB — PSA: PSA: 0.36 ng/mL (ref <=4.00)

## 2023-02-14 LAB — MICROALBUMIN / CREATININE URINE RATIO
Creatinine, Urine: 65 mg/dL (ref 20–320)
Microalb, Ur: <0.2 mg/dL

## 2023-02-14 NOTE — Progress Notes (Signed)
<>*<>*<>*<>*<>*<>*<>*<>*<>*<>*<>*<>*<>*<>*<>*<>*<>*<>*<>*<>*<>*<>*<>*<>*<> <>*<>*<>*<>*<>*<>*<>*<>*<>*<>*<>*<>*<>*<>*<>*<>*<>*<>*<>*<>*<>*<>*<>*<>*<>  -  Test results slightly outside the reference range are not unusual. If there is anything important, I will review this with you,  otherwise it is considered normal test values.  If you have further questions,  please do not hesitate to contact me at the office or via My Chart.   <>*<>*<>*<>*<>*<>*<>*<>*<>*<>*<>*<>*<>*<>*<>*<>*<>*<>*<>*<>*<>*<>*<>*<>*<> <>*<>*<>*<>*<>*<>*<>*<>*<>*<>*<>*<>*<>*<>*<>*<>*<>*<>*<>*<>*<>*<>*<>*<>*<>  -  Chol = 155 & LDL = 78 - Both  Excellent   - Very low risk for Heart Attack  / Stroke  ^>^>^>^>^>^>^>^>^>^>^>^>^>^>^>^>^>^>^>^>^>^>^>^>^>^>^>^>^>^>^>^>^>^>^>^>^ ^>^>^>^>^>^>^>^>^>^>^>^>^>^>^>^>^>^>^>^>^>^>^>^>^>^>^>^>^>^>^>^>^>^>^>^>^  -  But Triglycerides (  = 231 ) or fats in blood are too high                 (   Ideal or  Goal is less than 150  !  )    - Recommend avoid fried & greasy foods,  sweets / candy,   - Avoid white rice  (brown or wild rice or Quinoa is OK),   - Avoid white potatoes  (sweet potatoes are OK)   - Avoid anything made from white flour  - bagels, doughnuts, rolls, buns, biscuits, white and   wheat breads, pizza crust and traditional  pasta made of white flour & egg white  - (vegetarian pasta or spinach or wheat pasta is OK).    - Multi-grain bread is OK - like multi-grain flat bread or sandwich thins.   - Avoid alcohol in excess.   - Exercise is also important.  <>*<>*<>*<>*<>*<>*<>*<>*<>*<>*<>*<>*<>*<>*<>*<>*<>*<>*<>*<>*<>*<>*<>*<>*<> <>*<>*<>*<>*<>*<>*<>*<>*<>*<>*<>*<>*<>*<>*<>*<>*<>*<>*<>*<>*<>*<>*<>*<>*<>  -  A1c = 5.8% - Still Borderline elevated in the early or                                                         pre-diabetes range which has the same   300% increased risk for heart attack, stroke, cancer and                             alzheimer- type vascular  dementia as full blown diabetes.   But the good news is that diet, exercise with                                         weight loss can cure the early diabetes at this point.  <>*<>*<>*<>*<>*<>*<>*<>*<>*<>*<>*<>*<>*<>*<>*<>*<>*<>*<>*<>*<>*<>*<>*<>*<> <>*<>*<>*<>*<>*<>*<>*<>*<>*<>*<>*<>*<>*<>*<>*<>*<>*<>*<>*<>*<>*<>*<>*<>*<>  -  Carbamazepine Level - OK  - Please keep dose same   <>*<>*<>*<>*<>*<>*<>*<>*<>*<>*<>*<>*<>*<>*<>*<>*<>*<>*<>*<>*<>*<>*<>*<>*<> <>*<>*<>*<>*<>*<>*<>*<>*<>*<>*<>*<>*<>*<>*<>*<>*<>*<>*<>*<>*<>*<>*<>*<>*<>  -  PSA - Low - No Prostate - Great   !  <>*<>*<>*<>*<>*<>*<>*<>*<>*<>*<>*<>*<>*<>*<>*<>*<>*<>*<>*<>*<>*<>*<>*<>*<> <>*<>*<>*<>*<>*<>*<>*<>*<>*<>*<>*<>*<>*<>*<>*<>*<>*<>*<>*<>*<>*<>*<>*<>*<>  -  Vitamin D = 75 - excellent - Great     !    Please keep dosage same   <>*<>*<>*<>*<>*<>*<>*<>*<>*<>*<>*<>*<>*<>*<>*<>*<>*<>*<>*<>*<>*<>*<>*<>*<> <>*<>*<>*<>*<>*<>*<>*<>*<>*<>*<>*<>*<>*<>*<>*<>*<>*<>*<>*<>*<>*<>*<>*<>*<>  -  All Else - CBC - Kidneys - Electrolytes - Liver - Magnesium & Thyroid    - all  Normal / OK  <>*<>*<>*<>*<>*<>*<>*<>*<>*<>*<>*<>*<>*<>*<>*<>*<>*<>*<>*<>*<>*<>*<>*<>*<> <>*<>*<>*<>*<>*<>*<>*<>*<>*<>*<>*<>*<>*<>*<>*<>*<>*<>*<>*<>*<>*<>*<>*<>*<>  -  Keep up the [place]  Work   !  <>*<>*<>*<>*<>*<>*<>*<>*<>*<>*<>*<>*<>*<>*<>*<>*<>*<>*<>*<>*<>*<>*<>*<>*<> <>*<>*<>*<>*<>*<>*<>*<>*<>*<>*<>*<>*<>*<>*<>*<>*<>*<>*<>*<>*<>*<>*<>*<>*<>

## 2023-02-21 DIAGNOSIS — M48062 Spinal stenosis, lumbar region with neurogenic claudication: Secondary | ICD-10-CM | POA: Diagnosis not present

## 2023-02-26 DIAGNOSIS — M7138 Other bursal cyst, other site: Secondary | ICD-10-CM | POA: Diagnosis not present

## 2023-02-26 DIAGNOSIS — M48061 Spinal stenosis, lumbar region without neurogenic claudication: Secondary | ICD-10-CM | POA: Diagnosis not present

## 2023-02-26 DIAGNOSIS — M5116 Intervertebral disc disorders with radiculopathy, lumbar region: Secondary | ICD-10-CM | POA: Diagnosis not present

## 2023-03-20 DIAGNOSIS — H2513 Age-related nuclear cataract, bilateral: Secondary | ICD-10-CM | POA: Diagnosis not present

## 2023-03-20 DIAGNOSIS — H43813 Vitreous degeneration, bilateral: Secondary | ICD-10-CM | POA: Diagnosis not present

## 2023-04-11 ENCOUNTER — Other Ambulatory Visit: Payer: Self-pay | Admitting: Internal Medicine

## 2023-04-11 DIAGNOSIS — N529 Male erectile dysfunction, unspecified: Secondary | ICD-10-CM

## 2023-04-11 MED ORDER — TADALAFIL 20 MG PO TABS
ORAL_TABLET | ORAL | 3 refills | Status: DC
Start: 2023-04-11 — End: 2023-08-07

## 2023-06-07 NOTE — Progress Notes (Signed)
MEDICARE ANNUAL WELLNESS VISIT AND FOLLOW UP Assessment:   Diagnoses and all orders for this visit:  Encounter for Medicare annual wellness exam Due annually  Colonoscopy UTD 12/19/2022 next due 2027  Essential hypertension Continue medications -Metoprolol 100 mg every day  Elevated today, typically well controlled with current agent Monitor blood pressure at home; call if consistently over 130/80 Continue DASH diet.   Reminder to go to the ER if any CP, SOB, nausea, dizziness, severe HA, changes vision/speech, left arm numbness and tingling and jaw pain.  Coronary artery disease without angina pectoris, unspecified vessel or lesion type, unspecified whether native or transplanted heart Control blood pressure, cholesterol, glucose, increase exercise.  Lost to follow up with cardiology, had normal myoview in in 2013, he is very active with no angina or other concerns, medical risk management continued through this office, will refer back if needed  Seizure disorder (HCC) Well controlled by carbamazepine with last seizure remote Continue medication Carbamazapine 300 mg daily Last level 5.5- therapeutic range  Vitamin D deficiency At goal at recent check; continue to recommend supplementation for goal of 70-100 Defer vitamin D level  Hx of prediabetes Recent A1Cs at goal Discussed diet/exercise, weight management  Defer A1C; check CMP  Obesity (BMI 30.0-34.9) Long discussion about weight loss, diet, and exercise Recommended diet heavy in fruits and veggies and low in animal meats, cheeses, and dairy products, appropriate calorie intake Discussed appropriate weight for height Follow up at next visit  Mixed hyperlipidemia Continue medications: Rosuvastatin 20 mg every day  Continue low cholesterol diet and exercise.  Check lipid panel.   History of colon polyps Colonoscopy 08/2022 and does not require further colonoscopies due to age- Dr Elnoria Howard  GERD Discussed diet, avoiding  triggers and other lifestyle changes     Over 30 minutes of exam, counseling, chart review, and critical decision making was performed  Future Appointments  Date Time Provider Department Center  09/11/2023 10:30 AM Lucky Cowboy, MD [facility] None  12/13/2023 10:30 AM Raynelle Dick, NP [facility] None  03/17/2024 10:00 AM Lucky Cowboy, MD [facility] None  06/09/2024 11:00 AM Raynelle Dick, NP [facility] None     Plan:   During the course of the visit the patient was educated and counseled about appropriate screening and preventive services including:   Pneumococcal vaccine  Influenza vaccine Prevnar 13 Td vaccine Screening electrocardiogram Colorectal cancer screening Diabetes screening Glaucoma screening Nutrition counseling    Subjective:  [name] is a 73 y.o. male who presents for Medicare Annual Wellness Visit and 3 month follow up for HTN, hyperlipidemia, prediabetes, and vitamin D Def.    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 Levels have been routinely checked at this office and have remained stable over many years. Last 5.7 on 12/19/21  He is seeing Dr. Luiz Blare as needed, shoulders improved after steroid injection, no surgery needed.   02/26/2023 Had microdiscectomy with Dr. Dutch Quint. He was to have PT but started to do well on his own and did not complete.  Was released 05/25/23 unrestricted activity. Will still have a little twinge if he sits for a long time and stands up, resolves after he takes a few steps.   He reports having more reflux, will take TUMS prn but is not bothering him as much.   BMI is Body mass index is 32.13 kg/m., he  has been working on diet and exercise, has property and shops and very active throughout the day, minimal sitting. He is getting 11000-22000 steps a day Wt Readings from Last 3  Encounters:  06/08/23 208 lb 3.2 oz (94.4 kg)  02/13/23 201 lb 6.4 oz (91.4 kg)  11/13/22 193 lb 12.8 oz (87.9 kg)   In 2008, patient underwent an emergent CABG and has done well since. He had an ETT-myoview in 5/13 that was a normal study.   Est with Dr. Shirlee Latch, has not followed up with cardiology since 2016, denies concerning sx, declined referral back.     His blood pressure has been controlled at home (120-130/60-70's), currently on Metoprolol 100mg  QAM, today their BP is BP: (!) 140/70  BP Readings from Last 3 Encounters:  06/08/23 (!) 140/70  02/13/23 136/80  11/13/22 110/62  He does workout. He denies chest pain, shortness of breath, dizziness.    He is on cholesterol medication (crestor 20 mg daily) and denies myalgias. His LDL cholesterol is at goal. The cholesterol last visit was:   Lab Results  Component Value Date   CHOL 155 02/13/2023   HDL 45 02/13/2023   LDLCALC 78 02/13/2023   TRIG 231 (H) 02/13/2023   CHOLHDL 3.4 02/13/2023    He has been working on diet and exercise for hx of prediabetes (6.1% in 2016, 5.7% in 07/2018, predates 2017), and denies foot ulcerations, increased appetite, nausea, paresthesia of the feet, polydipsia, polyuria, visual disturbances, vomiting and weight loss. Last A1C in the office was:  Lab Results  Component Value Date   HGBA1C 5.8 (H) 02/13/2023   He has not been drinking much water. Last GFR Lab Results  Component Value Date   EGFR 91 02/13/2023     Patient is on Vitamin D supplement and at goal at recent check:    Lab Results  Component Value Date   VD25OH 34 02/13/2023      He is not currently on thyroid medication but had a slight elevation with last check.   Lab Results  Component Value Date   TSH 3.92 02/13/2023   T3TOTAL 109 08/13/2017      Medication Review: Current Outpatient Medications on File Prior to Visit  Medication Sig Dispense Refill   aspirin 81 MG tablet Take 81 mg by mouth daily.     carbamazepine  (CARBATROL) 300 MG 12 hr capsule Take  1 capsule  Daily  to Prevent  ' Absence Seizures ' 100 capsule 3   Cholecalciferol (VITAMIN D PO) Take 10,000 Units by mouth daily.     finasteride (PROSCAR) 5 MG tablet Take 1 tablet daily for prostate 90 tablet 3   fish oil-omega-3 fatty acids 1000 MG capsule Take 1 g by mouth daily.     LUTEIN PO Take by mouth daily.     Magnesium 250 MG TABS Take 250 mg by mouth daily.     metoprolol succinate (TOPROL-XL) 100 MG 24 hr tablet Take  1 tablet  Daily for BP & Heart                                                             /  TAKE                                                       BY                                                        MOUTH 90 tablet 3   Multiple Vitamins-Minerals (ZINC PO) Take by mouth.     rosuvastatin (CRESTOR) 20 MG tablet TAKE 1 TABLET BY MOUTH DAILY FOR CHOLESTEROL 100 tablet 2   tadalafil (CIALIS) 20 MG tablet Take 1/75 yo 1 tablet every 2 to 3 days as needed for XXXX                                /                                                                   TAKE                                         BY                                                 MOUTH 30 tablet 3   No current facility-administered medications on file prior to visit.    Allergies: Allergies  Allergen Reactions   Lipitor [Atorvastatin]     myalgia    Current Problems (verified) has Seizure disorder (HCC); Hyperlipidemia; Hypertension; CAD (coronary artery disease); Vitamin D deficiency; Medication management; Obesity (BMI 30.0-34.9); Other abnormal glucose (prediabetes); Encounter for Medicare annual wellness exam; History of colonic polyps; and Gastroesophageal reflux disease on their problem list.  Screening Tests Immunization History  Administered Date(s) Administered   Influenza, High Dose Seasonal PF 02/23/2015, 04/21/2017, 05/03/2017, 03/28/2018, 02/27/2019, 05/04/2020, 03/09/2022, 03/12/2023    Moderna SARS-COV2 Booster Vaccination 08/25/2020   PFIZER(Purple Top)SARS-COV-2 Vaccination 06/26/2019, 08/04/2019, 02/26/2020   Pfizer Covid-19 Vaccine Bivalent Booster 33yrs & up 03/09/2022   Pfizer(Comirnaty)Fall Seasonal Vaccine 12 years and older 03/12/2023   Pneumococcal Conjugate-13 12/17/2015   Pneumococcal Polysaccharide-23 04/22/2009   Td 04/22/2009   Tdap 07/17/2019   Zoster, Live 09/09/2009   Health Maintenance  Topic Date Due   COVID-19 Vaccine (6 - 2024-25 season) 06/24/2023 (Originally 05/07/2023)   Pneumonia Vaccine 63+ Years old (3 of 3 - PPSV23 or PCV20) 08/06/2023 (Originally 12/16/2016)   Zoster Vaccines- Shingrix (1 of 2) 09/06/2023 (Originally 06/16/1969)   Medicare Annual Wellness (AWV)  06/07/2024   Colonoscopy  12/18/2025   DTaP/Tdap/Td (3 - Td or Tdap) 07/16/2029   INFLUENZA VACCINE  Completed   Hepatitis C Screening  Completed   HPV  VACCINES  Aged Out   Fecal DNA (Cologuard)  Discontinued      Names of Other Physician/Practitioners you currently use: 1. [facility] here for primary care 2. Dr. Emily Filbert, eye doctor, last visit 03/2023 3. Dr. Lysbeth Penner, dentist, last visit 05/28/2023  Patient Care Team: Lucky Cowboy, MD as PCP - General (Internal Medicine) Laurey Morale, MD as Consulting Physician (Cardiology)  Surgical: He  has a past surgical history that includes Coronary artery bypass graft (08/2006); Cholecystectomy; Hand surgery; and laparoscopic appendectomy (N/A, 07/11/2022). Family His family history includes Diabetes in his father; Hypertension in his mother; Lupus in his sister; Stroke in his mother. Social history  He reports that he quit smoking about 55 years ago. His smoking use included cigarettes. He has never used smokeless tobacco. He reports that he does not drink alcohol and does not use drugs.  MEDICARE WELLNESS OBJECTIVES: Physical activity: Daily walking 40981- 22000 steps a day  Cardiac risk  factors: Cardiac Risk Factors include: advanced age (>65men, >65 women);dyslipidemia;male gender;hypertension;obesity (BMI >30kg/m2) Depression/mood screen:      06/08/2023   11:08 AM  Depression screen PHQ 2/9  Decreased Interest 0  Down, Depressed, Hopeless 0  PHQ - 2 Score 0    ADLs:     06/08/2023   11:03 AM 02/12/2023    9:40 PM  In your present state of health, do you have any difficulty performing the following activities:  Hearing? 0 0  Vision? 0 0  Difficulty concentrating or making decisions? 0 0  Walking or climbing stairs? 0 0  Dressing or bathing? 0 0  Doing errands, shopping? 0   Preparing Food and eating ? N   Using the Toilet? N   In the past six months, have you accidently leaked urine? N   Do you have problems with loss of bowel control? N   Managing your Medications? N   Managing your Finances? N   Housekeeping or managing your Housekeeping? N      Cognitive Testing  Alert? Yes  Normal Appearance?Yes  Oriented to person? Yes  Place? Yes   Time? Yes  Recall of three objects?  Yes  Can perform simple calculations? Yes  Displays appropriate judgment?Yes  Can read the correct time from a watch face?Yes  EOL planning: Does Patient Have a Medical Advance Directive?: Yes Type of Advance Directive: Healthcare Power of Attorney, Living will Does patient want to make changes to medical advance directive?: No - Patient declined Copy of Healthcare Power of Attorney in Chart?: No - copy requested   Objective:   Today's Vitals   06/08/23 1022  BP: (!) 140/70  Pulse: (!) 52  Temp: (!) 97.3 F (36.3 C)  SpO2: 97%  Weight: 208 lb 3.2 oz (94.4 kg)  Height: 5' 7.5" (1.715 m)     Body mass index is 32.13 kg/m.  General appearance: alert, no distress, WD/WN, male HEENT: normocephalic, sclerae anicteric, TMs pearly, nares patent, no discharge or erythema, pharynx normal Oral cavity: MMM, no lesions Neck: supple, no lymphadenopathy, no thyromegaly, no  masses Heart: RRR, normal S1, S2, no murmurs Lungs: CTA bilaterally, no wheezes, rhonchi, or rales Abdomen: +bs, soft, non tender, non distended, no masses, no hepatomegaly, no splenomegaly Musculoskeletal: nontender, no swelling, no obvious deformity Extremities: no edema, no cyanosis, no clubbing Pulses: 2+ symmetric, upper and lower extremities, normal cap refill Neurological: alert, oriented x 3, CN2-12 intact, strength normal upper extremities and lower extremities, sensation normal throughout, DTRs  2+ throughout, no cerebellar signs, gait normal Psychiatric: normal affect, behavior normal, pleasant   Medicare Attestation I have personally reviewed: The patient's medical and social history Their use of alcohol, tobacco or illicit drugs Their current medications and supplements The patient's functional ability including ADLs,fall risks, home safety risks, cognitive, and hearing and visual impairment Diet and physical activities Evidence for depression or mood disorders  The patient's weight, height, BMI, and visual acuity have been recorded in the chart.  I have made referrals, counseling, and provided education to the patient based on review of the above and I have provided the patient with a written personalized care plan for preventive services.     Raynelle Dick, NP   06/08/2023

## 2023-06-08 ENCOUNTER — Ambulatory Visit (INDEPENDENT_AMBULATORY_CARE_PROVIDER_SITE_OTHER): Payer: Medicare Other | Admitting: Nurse Practitioner

## 2023-06-08 ENCOUNTER — Encounter: Payer: Self-pay | Admitting: Nurse Practitioner

## 2023-06-08 VITALS — BP 140/70 | HR 52 | Temp 97.3°F | Ht 67.5 in | Wt 208.2 lb

## 2023-06-08 DIAGNOSIS — Z0000 Encounter for general adult medical examination without abnormal findings: Secondary | ICD-10-CM

## 2023-06-08 DIAGNOSIS — E559 Vitamin D deficiency, unspecified: Secondary | ICD-10-CM

## 2023-06-08 DIAGNOSIS — R7309 Other abnormal glucose: Secondary | ICD-10-CM | POA: Diagnosis not present

## 2023-06-08 DIAGNOSIS — K219 Gastro-esophageal reflux disease without esophagitis: Secondary | ICD-10-CM | POA: Diagnosis not present

## 2023-06-08 DIAGNOSIS — Z0001 Encounter for general adult medical examination with abnormal findings: Secondary | ICD-10-CM

## 2023-06-08 DIAGNOSIS — I25810 Atherosclerosis of coronary artery bypass graft(s) without angina pectoris: Secondary | ICD-10-CM | POA: Diagnosis not present

## 2023-06-08 DIAGNOSIS — G40909 Epilepsy, unspecified, not intractable, without status epilepticus: Secondary | ICD-10-CM | POA: Diagnosis not present

## 2023-06-08 DIAGNOSIS — E782 Mixed hyperlipidemia: Secondary | ICD-10-CM | POA: Diagnosis not present

## 2023-06-08 DIAGNOSIS — R6889 Other general symptoms and signs: Secondary | ICD-10-CM

## 2023-06-08 DIAGNOSIS — I10 Essential (primary) hypertension: Secondary | ICD-10-CM | POA: Diagnosis not present

## 2023-06-08 DIAGNOSIS — Z79899 Other long term (current) drug therapy: Secondary | ICD-10-CM

## 2023-06-08 DIAGNOSIS — E66811 Obesity, class 1: Secondary | ICD-10-CM

## 2023-06-08 DIAGNOSIS — N529 Male erectile dysfunction, unspecified: Secondary | ICD-10-CM

## 2023-06-08 MED ORDER — SILDENAFIL CITRATE 50 MG PO TABS
50.0000 mg | ORAL_TABLET | Freq: Every day | ORAL | 3 refills | Status: AC | PRN
Start: 2023-06-08 — End: ?

## 2023-06-08 NOTE — Patient Instructions (Signed)

## 2023-06-09 ENCOUNTER — Encounter: Payer: Self-pay | Admitting: Nurse Practitioner

## 2023-06-09 LAB — CBC WITH DIFFERENTIAL/PLATELET
Absolute Lymphocytes: 1173 {cells}/uL (ref 850–3900)
Absolute Monocytes: 292 {cells}/uL (ref 200–950)
Basophils Absolute: 31 {cells}/uL (ref 0–200)
Basophils Relative: 0.9 %
Eosinophils Absolute: 82 {cells}/uL (ref 15–500)
Eosinophils Relative: 2.4 %
HCT: 41.1 % (ref 38.5–50.0)
Hemoglobin: 14.2 g/dL (ref 13.2–17.1)
MCH: 31.6 pg (ref 27.0–33.0)
MCHC: 34.5 g/dL (ref 32.0–36.0)
MCV: 91.5 fL (ref 80.0–100.0)
MPV: 10.1 fL (ref 7.5–12.5)
Monocytes Relative: 8.6 %
Neutro Abs: 1822 {cells}/uL (ref 1500–7800)
Neutrophils Relative %: 53.6 %
Platelets: 180 10*3/uL (ref 140–400)
RBC: 4.49 10*6/uL (ref 4.20–5.80)
RDW: 12.2 % (ref 11.0–15.0)
Total Lymphocyte: 34.5 %
WBC: 3.4 10*3/uL — ABNORMAL LOW (ref 3.8–10.8)

## 2023-06-09 LAB — COMPLETE METABOLIC PANEL WITH GFR
AG Ratio: 1.8 (calc) (ref 1.0–2.5)
ALT: 18 U/L (ref 9–46)
AST: 19 U/L (ref 10–35)
Albumin: 4.4 g/dL (ref 3.6–5.1)
Alkaline phosphatase (APISO): 71 U/L (ref 35–144)
BUN: 15 mg/dL (ref 7–25)
CO2: 30 mmol/L (ref 20–32)
Calcium: 9.3 mg/dL (ref 8.6–10.3)
Chloride: 103 mmol/L (ref 98–110)
Creat: 0.86 mg/dL (ref 0.70–1.28)
Globulin: 2.5 g/dL (ref 1.9–3.7)
Glucose, Bld: 107 mg/dL — ABNORMAL HIGH (ref 65–99)
Potassium: 4.3 mmol/L (ref 3.5–5.3)
Sodium: 139 mmol/L (ref 135–146)
Total Bilirubin: 0.3 mg/dL (ref 0.2–1.2)
Total Protein: 6.9 g/dL (ref 6.1–8.1)
eGFR: 91 mL/min/{1.73_m2} (ref >=60)

## 2023-06-09 LAB — LIPID PANEL
Cholesterol: 135 mg/dL (ref <200)
HDL: 44 mg/dL (ref >=40)
LDL Cholesterol (Calc): 71 mg/dL
Non-HDL Cholesterol (Calc): 91 mg/dL (ref <130)
Total CHOL/HDL Ratio: 3.1 (calc) (ref <5.0)
Triglycerides: 121 mg/dL (ref <150)

## 2023-06-09 LAB — MAGNESIUM: Magnesium: 2.1 mg/dL (ref 1.5–2.5)

## 2023-07-12 ENCOUNTER — Telehealth: Payer: Self-pay | Admitting: Internal Medicine

## 2023-07-12 NOTE — Telephone Encounter (Signed)
 Copied from CRM [id number]. Topic: Appointments - Scheduling Inquiry for Clinic >> Jul 12, 2023  1:17 PM Denese Killings wrote: Reason for CRM: Patient wants to schedule an appointment with Dr. Milinda Antis. States his wife sees her. Patient wife is requesting a callback if approved.

## 2023-07-12 NOTE — Telephone Encounter (Signed)
 Call pt and schedule a appt for new pt

## 2023-07-12 NOTE — Telephone Encounter (Signed)
 That is fine Please put in a new pt appointment

## 2023-08-07 ENCOUNTER — Ambulatory Visit: Payer: Medicare Other | Admitting: Family Medicine

## 2023-08-07 ENCOUNTER — Encounter: Payer: Self-pay | Admitting: Family Medicine

## 2023-08-07 VITALS — BP 155/90 | HR 54 | Temp 98.1°F | Ht 66.5 in | Wt 207.2 lb

## 2023-08-07 DIAGNOSIS — E66811 Obesity, class 1: Secondary | ICD-10-CM

## 2023-08-07 DIAGNOSIS — E782 Mixed hyperlipidemia: Secondary | ICD-10-CM

## 2023-08-07 DIAGNOSIS — E559 Vitamin D deficiency, unspecified: Secondary | ICD-10-CM

## 2023-08-07 DIAGNOSIS — I10 Essential (primary) hypertension: Secondary | ICD-10-CM | POA: Diagnosis not present

## 2023-08-07 DIAGNOSIS — N400 Enlarged prostate without lower urinary tract symptoms: Secondary | ICD-10-CM | POA: Insufficient documentation

## 2023-08-07 DIAGNOSIS — N529 Male erectile dysfunction, unspecified: Secondary | ICD-10-CM

## 2023-08-07 DIAGNOSIS — I2510 Atherosclerotic heart disease of native coronary artery without angina pectoris: Secondary | ICD-10-CM

## 2023-08-07 DIAGNOSIS — R7309 Other abnormal glucose: Secondary | ICD-10-CM

## 2023-08-07 DIAGNOSIS — G40909 Epilepsy, unspecified, not intractable, without status epilepticus: Secondary | ICD-10-CM

## 2023-08-07 DIAGNOSIS — N401 Enlarged prostate with lower urinary tract symptoms: Secondary | ICD-10-CM

## 2023-08-07 DIAGNOSIS — Z23 Encounter for immunization: Secondary | ICD-10-CM | POA: Diagnosis not present

## 2023-08-07 DIAGNOSIS — Z860100 Personal history of colon polyps, unspecified: Secondary | ICD-10-CM

## 2023-08-07 NOTE — Assessment & Plan Note (Signed)
 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

## 2023-08-07 NOTE — Assessment & Plan Note (Signed)
 Last vitamin D Lab Results  Component Value Date   VD25OH 75 02/13/2023   Takes 10,000 international units daily

## 2023-08-07 NOTE — Assessment & Plan Note (Signed)
 Under cardiology care  Taking metoprolol and baby asa 81 mg daily  CABG in past  No angina symptoms   Blood pressure is high -will follow up 2 wk for re check with his cuff

## 2023-08-07 NOTE — Assessment & Plan Note (Signed)
 Cialis gives him headache the next day   Viagra 50 does not /but would like it to work better Will try a 100 mg dose at home and update [place] with response / if better we can increase dose to 100   Instructed not to take with nitrogylcerin (he does not carry this at all)

## 2023-08-07 NOTE — Assessment & Plan Note (Signed)
 Lab Results  Component Value Date   HGBA1C 5.8 (H) 02/13/2023   HGBA1C 5.9 (H) 06/28/2022   HGBA1C 5.6 12/19/2021   disc imp of low glycemic diet and wt loss to prevent DM2

## 2023-08-07 NOTE — Assessment & Plan Note (Signed)
 BP: (!) 155/90  Not optimal  Per pt much better at home Plan follow up 2 wk / will bring cuff with him and we can test it   Given info on Mediterranean diet  Avoiding processed foods Continue good exercise   Takes metoprolol xl 100 mg daily   Will add medication if not improved

## 2023-08-07 NOTE — Progress Notes (Signed)
 Subjective:    Patient ID: [name], male    DOB: [birth year], 73 y.o.   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    HTN bp is stable today  No cp or palpitations or headaches or edema  No side effects to medicines  BP Readings from Last 3 Encounters:  08/07/23 (!) 155/90  06/08/23 (!) 140/70  02/13/23 136/80    Sees cardiology /Dr Shirlee Latch for cardiology  Has CAD with LIMA- LAD in 2008   Metoprolol xl 100 mg daily  Asa 81 mg daily   Blood pressure is usually around 130/ 60s  Some white coat syndrome   Pulse Readings from Last 3 Encounters:  08/07/23 (!) 54  06/08/23 (!) 52  02/13/23 76   No symptoms  Has not had to use nitroglycerin  Appointment upcoming with Dr Elesa Hacker Dr Emily Filbert for eye care at [facility]      Hyperlipidemia Lab Results  Component Value Date   CHOL 135 06/08/2023   HDL 44 06/08/2023   LDLCALC 71 06/08/2023   TRIG 121 06/08/2023   CHOLHDL 3.1 06/08/2023  Crestor 20 mg daily  Omega 3 fish oild 1 g daily    Exercise- walking regularly  Has some dumbells he uses  Does logging- lifts heavy a lot  Has a wood shop Stays outside all the time  Has a garden Does heavy work    Seizure disorder  Absence type  One - was grand mal  Carbamazepine 300 mg daily  Last seizure was over 20 y ago  Dr Patrecia Pace used to be  his neurologist     Prostate care  BPH with LUTS  Lab Results  Component Value Date   PSA 0.36 02/13/2023   PSA 0.33 12/19/2021   PSA 0.35 12/17/2020    Takes finasteride 5 mg daily and out of it (was cutting into  quarters)  Sildenafil for ED, cialis as well   Was on cialis first - for 3 years Takes it when he needs it  This can give him a headache the next day   Then given viagra 50 mg to use prn  Has taken 75 mg and tolerated it well      Prediabetes Lab Results  Component Value Date   HGBA1C 5.8 (H) 02/13/2023   HGBA1C 5.9 (H) 06/28/2022   HGBA1C 5.6 12/19/2021   Wife cooks   Lost some weight on GOLOW    Vit D def Last vitamin D Lab Results  Component Value Date   VD25OH 80 02/13/2023   Takes vit D 10,000 international units daily   History of lumbar spinal stenosis  A ruptured disk  Shots in past [facility]- were only helping short term  Sees Dr Jordan Likes [facility]-did have surgery / was released in January    History of colon polyps  Colonoscopy 11/2022 clear / did have diverticulosis Dr Elnoria Howard Was normal    Father -  HTN  Died at 75 Mother had alz   Patient Active Problem List   Diagnosis Date Noted   Erectile dysfunction 08/07/2023   BPH (benign prostatic hyperplasia) 08/07/2023   Gastroesophageal reflux disease 08/24/2020   History of colonic polyps 11/07/2018   Other abnormal glucose (prediabetes) 02/23/2015   Obesity (BMI 30.0-34.9) 01/29/2014   Vitamin D deficiency 04/03/2013   CAD (coronary artery disease) 10/01/2011   Hypertension 08/31/2010   Seizure disorder (HCC)    Hyperlipidemia    Past Medical History:  Diagnosis Date   Bilateral carpal tunnel syndrome    Dyslipidemia    Hyperlipidemia    Ischemic heart disease    Plantar fasciitis    Seizure disorder (HCC)    Vitamin D deficiency    Past Surgical History:  Procedure Laterality Date   CHOLECYSTECTOMY     CORONARY ARTERY BYPASS GRAFT  08/2006   HAND SURGERY     LAPAROSCOPIC APPENDECTOMY N/A 07/11/2022   Procedure: APPENDECTOMY LAPAROSCOPIC;  Surgeon: Axel Filler, MD;  Location: [facility] OR;  Service: General;  Laterality: N/A;   Social History   Tobacco Use   Smoking status: Former     Current packs/day: 0.00    Types: Cigarettes    Quit date: 05/22/1968    Years since quitting: 55.2   Smokeless tobacco: Never  Substance Use Topics   Alcohol use: No    Alcohol/week: 0.0 standard drinks of alcohol    Comment: very rarely   Drug use: No   Family History  Problem Relation Age of Onset   Hypertension Mother    Stroke Mother    Diabetes Father    Lupus Sister    Allergies  Allergen Reactions   Lipitor [Atorvastatin]     myalgia   Current Outpatient Medications on File Prior to Visit  Medication Sig Dispense Refill   aspirin 81 MG tablet Take 81 mg by mouth daily.     carbamazepine (CARBATROL) 300 MG 12 hr capsule Take  1 capsule  Daily  to Prevent  ' Absence Seizures ' 100 capsule 3   Cholecalciferol (VITAMIN D PO) Take 10,000 Units by mouth daily.     fish oil-omega-3 fatty acids 1000 MG capsule Take 1 g by mouth daily.     LUTEIN PO Take by mouth daily.     Magnesium 250 MG TABS Take 250 mg by mouth daily.     metoprolol succinate (TOPROL-XL) 100 MG 24 hr tablet Take  1 tablet  Daily for BP & Heart                                                             /                                     TAKE                                                       BY  MOUTH 90 tablet 3   rosuvastatin (CRESTOR) 20 MG tablet TAKE 1 TABLET BY MOUTH DAILY FOR CHOLESTEROL 100 tablet 2   sildenafil (VIAGRA) 50 MG tablet Take 1 tablet (50 mg total) by mouth daily as needed for erectile dysfunction. 30 tablet 3   No current facility-administered medications on file prior to visit.    Review of Systems  Constitutional:  Negative for activity change, appetite change, fatigue, fever and unexpected weight change.  HENT:  Negative for congestion, rhinorrhea, sore throat and trouble swallowing.   Eyes:  Negative for pain, redness, itching and visual disturbance.  Respiratory:  Negative for cough, chest tightness, shortness of  breath and wheezing.   Cardiovascular:  Negative for chest pain and palpitations.  Gastrointestinal:  Negative for abdominal pain, blood in stool, constipation, diarrhea and nausea.  Endocrine: Negative for cold intolerance, heat intolerance, polydipsia and polyuria.  Genitourinary:  Negative for difficulty urinating, dysuria, frequency and urgency.       ED  Musculoskeletal:  Positive for back pain. Negative for arthralgias, joint swelling and myalgias.       Back pain is improved   Skin:  Negative for pallor and rash.  Neurological:  Negative for dizziness, tremors, weakness, numbness and headaches.  Hematological:  Negative for adenopathy. Does not bruise/bleed easily.  Psychiatric/Behavioral:  Negative for decreased concentration and dysphoric mood. The patient is not nervous/anxious.        Objective:   Physical Exam Constitutional:      General: He is not in acute distress.    Appearance: Normal appearance. He is well-developed. He is obese. He is not ill-appearing or diaphoretic.  HENT:     Head: Normocephalic and atraumatic.     Right Ear: Tympanic membrane and ear canal normal.     Left Ear: Tympanic membrane and ear canal normal.     Mouth/Throat:     Mouth: Mucous membranes are moist.  Eyes:     General:        Right eye: No discharge.        Left eye: No discharge.     Conjunctiva/sclera: Conjunctivae normal.     Pupils: Pupils are equal, round, and reactive to light.  Neck:     Thyroid: No thyromegaly.     Vascular: No carotid bruit or JVD.  Cardiovascular:     Rate and Rhythm: Normal rate and regular rhythm.     Heart sounds: Normal heart sounds.     No gallop.  Pulmonary:     Effort: Pulmonary effort is normal. No respiratory distress.     Breath sounds: Normal breath sounds. No wheezing or rales.  Abdominal:     General: There is no distension or abdominal bruit.     Palpations: Abdomen is soft.  Musculoskeletal:     Cervical back: Normal range of motion  and neck supple.     Right lower leg: No edema.     Left lower leg: No edema.  Lymphadenopathy:     Cervical: No cervical adenopathy.  Skin:    General: Skin is warm and dry.     Coloration: Skin is not pale.     Findings: No rash.  Neurological:     Mental Status: He is alert.     Coordination: Coordination normal.     Deep Tendon Reflexes: Reflexes are normal and symmetric. Reflexes normal.  Psychiatric:        Mood and Affect: Mood normal.  Assessment & Plan:   Problem List Items Addressed This Visit       Cardiovascular and Mediastinum   Hypertension - Primary   BP: (!) 155/90  Not optimal  Per pt much better at home Plan follow up 2 wk / will bring cuff with him and we can test it   Given info on Mediterranean diet  Avoiding processed foods Continue good exercise   Takes metoprolol xl 100 mg daily   Will add medication if not improved       CAD (coronary artery disease)   Under cardiology care  Taking metoprolol and baby asa 81 mg daily  CABG in past  No angina symptoms   Blood pressure is high -will follow up 2 wk for re check with his cuff        Nervous and Auditory   Seizure disorder (HCC)   Absence type with one grand mal  Last one many years ago  Saw Neuro for years and he was told to continue follow up with primary care   Carbamazepine 300 mg one daily           Genitourinary   BPH (benign prostatic hyperplasia)   Really not bothersome Does not think finasteride helped-will stop it  Lab Results  Component Value Date   PSA 0.36 02/13/2023   PSA 0.33 12/19/2021   PSA 0.35 12/17/2020            Other   Vitamin D deficiency   Last vitamin D Lab Results  Component Value Date   VD25OH 75 02/13/2023   Takes 10,000 international units daily      Other abnormal glucose (prediabetes)   Lab Results  Component Value Date   HGBA1C 5.8 (H) 02/13/2023   HGBA1C 5.9 (H) 06/28/2022   HGBA1C 5.6 12/19/2021   disc imp  of low glycemic diet and wt loss to prevent DM2       Obesity (BMI 30.0-34.9)   Discussed how this problem influences overall health and the risks it imposes  Reviewed plan for weight loss with lower calorie diet (via better food choices (lower glycemic and portion control) along with exercise building up to or more than 30 minutes 5 days per week including some aerobic activity and strength training    Pt gained weight after back problem/sitting for a year Much better after surgery in jan and getting back to regular activity  Given handout on Mediterranean diet       Hyperlipidemia   Disc goals for lipids and reasons to control them Rev last labs with pt Rev low sat fat diet in detail   Lab Results  Component Value Date   LDLCALC 71 06/08/2023   Crestor 20 mg daily  Omega 3 fish oil 1 g daily  Very active Has CAD / sees cardiology         History of colonic polyps   Colonoscopy normal 11/2022 Dr Elnoria Howard       Erectile dysfunction   Cialis gives him headache the next day   Viagra 50 does not /but would like it to work better Will try a 100 mg dose at home and update [place] with response / if better we can increase dose to 100   Instructed not to take with nitrogylcerin (he does not carry this at all)

## 2023-08-07 NOTE — Assessment & Plan Note (Signed)
 Discussed how this problem influences overall health and the risks it imposes  Reviewed plan for weight loss with lower calorie diet (via better food choices (lower glycemic and portion control) along with exercise building up to or more than 30 minutes 5 days per week including some aerobic activity and strength training    Pt gained weight after back problem/sitting for a year Much better after surgery in jan and getting back to regular activity  Given handout on Mediterranean diet

## 2023-08-07 NOTE — Assessment & Plan Note (Signed)
 Disc goals for lipids and reasons to control them Rev last labs with pt Rev low sat fat diet in detail   Lab Results  Component Value Date   LDLCALC 71 06/08/2023   Crestor 20 mg daily  Omega 3 fish oil 1 g daily  Very active Has CAD / sees cardiology

## 2023-08-07 NOTE — Patient Instructions (Addendum)
 Keep walking for exercise  Keep up the lifting/ strength training   Try to get most of your carbohydrates from produce (with the exception of white potatoes) and whole grains Eat less bread/pasta/rice/snack foods/cereals/sweets and other items from the middle of the grocery store (processed carbs)  Hold the finasteride if it is not helping  If you start having trouble with urination let us know   Stop the cialis  Try 100 mg of the viagra to see if it works better and if you tolerate it well  Let us know and if you want ot stick with 100 mg I can send that in  Blood pressure is up today  Please follow up in a few weeks / bring your cuff and we will test it and check again  Keep a log of blood pressure readings at home (when you are relaxed)   Pneumonia shot today

## 2023-08-07 NOTE — Assessment & Plan Note (Signed)
 Colonoscopy normal 11/2022 Dr Elnoria Howard

## 2023-08-07 NOTE — Assessment & Plan Note (Signed)
 Really not bothersome Does not think finasteride helped-will stop it  Lab Results  Component Value Date   PSA 0.36 02/13/2023   PSA 0.33 12/19/2021   PSA 0.35 12/17/2020

## 2023-08-22 ENCOUNTER — Ambulatory Visit (INDEPENDENT_AMBULATORY_CARE_PROVIDER_SITE_OTHER): Admitting: Family Medicine

## 2023-08-22 ENCOUNTER — Encounter: Payer: Self-pay | Admitting: Family Medicine

## 2023-08-22 VITALS — BP 129/70 | HR 46 | Temp 97.9°F | Ht 66.5 in | Wt 203.0 lb

## 2023-08-22 DIAGNOSIS — I10 Essential (primary) hypertension: Secondary | ICD-10-CM | POA: Diagnosis not present

## 2023-08-22 DIAGNOSIS — N529 Male erectile dysfunction, unspecified: Secondary | ICD-10-CM
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
## 2023-08-22 NOTE — Patient Instructions (Addendum)
 I think your cuff is accurate Blood pressure here is better   Continue current medicines for now  See cardiology as planned   Stay active Eat healthy !

## 2023-08-22 NOTE — Assessment & Plan Note (Signed)
 Tried sildenafil 100 mg and it works better Will call if /when needs refill

## 2023-08-22 NOTE — Assessment & Plan Note (Signed)
 Pt has some whitecoat component  Blood pressure is always lower at home  His cuff here correlated fairly with ours   BP: 129/70  Improved on 2nd and 3rd check after sitting   Plan to continue metoprolol l 100 mg daily   Has cardiology follow up this spring   Encouraged him to stay active

## 2023-08-28 ENCOUNTER — Telehealth: Payer: Self-pay | Admitting: Family Medicine

## 2023-08-28 DIAGNOSIS — G40909 Epilepsy, unspecified, not intractable, without status epilepticus: Secondary | ICD-10-CM

## 2023-08-28 NOTE — Addendum Note (Signed)
 Addended by: Roxy Manns A on: 08/28/2023 08:33 PM   Modules accepted: Orders

## 2023-08-28 NOTE — Telephone Encounter (Signed)
 Copied from CRM [id number]. Topic: Referral - Request for Referral >> Aug 28, 2023  1:16 PM Sim Boast F wrote: Did the patient discuss referral with their provider in the last year? Yes (If No - schedule appointment) (If Yes - send message)  Appointment offered? No  Type of order/referral and detailed reason for visit: [facility] as discussed at previous visit - office said he needs new referral because he hasn't been there in 10 years   Preference of office, provider, location: [facility]   If referral order, have you been seen by this specialty before? Yes (If Yes, this issue or another issue? When? Where?  Can we respond through MyChart? Yes

## 2023-08-28 NOTE — Telephone Encounter (Signed)
 I put the referral in  Please let us know if you don't hear in 1-2 weeks  He can also call their office- may be quicker

## 2023-08-29 NOTE — Telephone Encounter (Signed)
 They called this morning and scheduled an appt with pt

## 2023-09-11 ENCOUNTER — Ambulatory Visit: Payer: Medicare Other | Admitting: Internal Medicine

## 2023-10-02 ENCOUNTER — Encounter: Payer: Self-pay | Admitting: Cardiology

## 2023-10-02 ENCOUNTER — Other Ambulatory Visit: Payer: Self-pay | Admitting: *Deleted

## 2023-10-02 ENCOUNTER — Encounter: Payer: Self-pay | Admitting: *Deleted

## 2023-10-02 ENCOUNTER — Ambulatory Visit: Attending: Cardiology | Admitting: Cardiology

## 2023-10-02 VITALS — BP 162/74 | HR 66 | Resp 16 | Ht 66.0 in | Wt 207.0 lb

## 2023-10-02 DIAGNOSIS — I25118 Atherosclerotic heart disease of native coronary artery with other forms of angina pectoris: Secondary | ICD-10-CM | POA: Diagnosis not present

## 2023-10-02 DIAGNOSIS — E7800 Pure hypercholesterolemia, unspecified: Secondary | ICD-10-CM

## 2023-10-02 DIAGNOSIS — I10 Essential (primary) hypertension: Secondary | ICD-10-CM

## 2023-10-02 MED ORDER — LISINOPRIL 10 MG PO TABS: 10.0000 mg | ORAL_TABLET | Freq: Every evening | ORAL | 6 refills | Status: DC

## 2023-10-02 NOTE — Progress Notes (Unsigned)
 Cardiology Office Note:  .   Date:  10/03/2023  ID:  [name], DOB 15-Mar-1951, MRN [id number] PCP: Clemens Curt, MD  [facility] Providers Cardiologist:  Knox Perl, MD   History of Present Illness: .   [name] is a 73 y.o. Caucasian male patient with coronary artery disease and single-vessel LIMA to LAD bypass surgery in 2008, cardiac catheterization at that time revealing a ostial LAD high-grade stenosis but otherwise no disease, hypertension, hypercholesterolemia presents here today to reestablish cardiac care.  Discussed the use of AI scribe software for clinical note transcription with the patient, who gave verbal consent to proceed.  History of Present Illness [name] is a 73 year old male with coronary artery disease and prior bypass surgery who presents with recent chest pain.  He experiences chest pain similar to pre-surgery episodes, occurring twice in recent months, with the latest episode yesterday while changing a tire. He had taken Viagra  earlier that day. A previous episode occurred a few months ago, but the activity at that time is unclear. His wife recalls similar pain before his past cardiac event, especially during physical exertion.  He underwent one-vessel bypass surgery in 2008 for a proximal LAD blockage. He takes aspirin 81 mg daily, metoprolol  100 mg daily, and rosuvastatin  20 mg daily.  He is physically active, engaging in woodworking, gardening, and walking 10,000 to 22,000 steps daily. His blood pressure at home ranges from 117/62 to 132/67, with higher readings in clinical settings attributed to 'white coat syndrome'. He does not use tobacco products.  Labs   Lab Results  Component Value Date   CHOL 135 06/08/2023   HDL 44 06/08/2023   LDLCALC 71 06/08/2023   TRIG 121 06/08/2023   CHOLHDL 3.1 06/08/2023   Lab Results  Component Value Date   NA 139 06/08/2023   K 4.3 06/08/2023   CO2 30 06/08/2023   GLUCOSE 107 (H)  06/08/2023   BUN 15 06/08/2023   CREATININE 0.86 06/08/2023   CALCIUM  9.3 06/08/2023   EGFR 91 06/08/2023   GFRNONAA >60 07/11/2022      Latest Ref Rng & Units 06/08/2023   11:25 AM 02/13/2023   10:05 AM 07/11/2022    7:15 AM  BMP  Glucose 65 - 99 mg/dL 409  811  914   BUN 7 - 25 mg/dL 15  20  18    Creatinine 0.70 - 1.28 mg/dL 7.82  9.56  2.13   BUN/Creat Ratio 6 - 22 (calc) SEE NOTE:  SEE NOTE:    Sodium 135 - 146 mmol/L 139  140  139   Potassium 3.5 - 5.3 mmol/L 4.3  3.8  3.7   Chloride 98 - 110 mmol/L 103  103  104   CO2 20 - 32 mmol/L 30  29  26    Calcium  8.6 - 10.3 mg/dL 9.3  9.6  9.1       Latest Ref Rng & Units 06/08/2023   11:25 AM 02/13/2023   10:05 AM 07/11/2022    7:15 AM  CBC  WBC 3.8 - 10.8 Thousand/uL 3.4  4.0  8.1   Hemoglobin 13.2 - 17.1 g/dL 08.6  57.8  46.9   Hematocrit 38.5 - 50.0 % 41.1  43.1  39.7   Platelets 140 - 400 Thousand/uL 180  174  138    Lab Results  Component Value Date   HGBA1C 5.8 (H) 02/13/2023    Lab Results  Component Value Date  TSH 3.92 02/13/2023     ROS  Review of Systems  Cardiovascular:  Positive for chest pain. Negative for dyspnea on exertion and leg swelling.    Physical Exam:   VS:  BP (!) 162/74 (BP Location: Left Arm, Patient Position: Sitting, Cuff Size: Large)   Pulse 66   Resp 16   Ht 5\' 6"  (1.676 m)   Wt 207 lb (93.9 kg)   SpO2 97%   BMI 33.41 kg/m    Wt Readings from Last 3 Encounters:  10/02/23 207 lb (93.9 kg)  08/22/23 203 lb (92.1 kg)  08/07/23 207 lb 4 oz (94 kg)    Physical Exam Neck:     Vascular: No carotid bruit or JVD.  Cardiovascular:     Rate and Rhythm: Normal rate and regular rhythm.     Pulses: Intact distal pulses.     Heart sounds: S1 normal and S2 normal. Murmur heard.     Early systolic murmur is present with a grade of 2/6 at the upper right sternal border.     No gallop.  Pulmonary:     Effort: Pulmonary effort is normal.     Breath sounds: Normal breath sounds.  Abdominal:      General: Bowel sounds are normal.     Palpations: Abdomen is soft.  Musculoskeletal:     Right lower leg: No edema.     Left lower leg: No edema.     EKG:    EKG Interpretation Date/Time:  Tuesday Oct 02 2023 13:38:52 EDT Ventricular Rate:  49 PR Interval:  186 QRS Duration:  96 QT Interval:  448 QTC Calculation: 404 R Axis:   6  Text Interpretation: EKG 10/02/2023: Normal sinus rhythm/sinus bradycardia at rate of 49 bpm, incomplete right bundle branch block.  Normal EKG.  No change from 07/11/2022. Confirmed by Shynice Sigel, Jagadeesh ([id number]) on 10/02/2023 1:49:49 PM    Medications and allergies    Allergies  Allergen Reactions   Lipitor [Atorvastatin]     myalgia     Current Outpatient Medications:    aspirin 81 MG tablet, Take 81 mg by mouth daily., Disp: , Rfl:    carbamazepine  (CARBATROL ) 300 MG 12 hr capsule, Take  1 capsule  Daily  to Prevent  ' Absence Seizures ', Disp: 100 capsule, Rfl: 3   Cholecalciferol (VITAMIN D  PO), Take 10,000 Units by mouth daily., Disp: , Rfl:    fish oil-omega-3 fatty acids 1000 MG capsule, Take 1 g by mouth daily., Disp: , Rfl:    lisinopril (ZESTRIL) 10 MG tablet, Take 1 tablet (10 mg total) by mouth every evening., Disp: 30 tablet, Rfl: 6   LUTEIN PO, Take by mouth daily., Disp: , Rfl:    Magnesium 250 MG TABS, Take 250 mg by mouth every other day., Disp: , Rfl:    metoprolol  succinate (TOPROL -XL) 100 MG 24 hr tablet, Take  1 tablet  Daily for BP & Heart                                                             /  TAKE                                                       BY                                                        MOUTH (Patient taking differently: Take 100 mg by mouth daily. Take  1 tablet  Daily for BP & Heart), Disp: 90 tablet, Rfl: 3   rosuvastatin  (CRESTOR ) 20 MG tablet, TAKE 1 TABLET BY MOUTH DAILY FOR CHOLESTEROL, Disp: 100 tablet, Rfl: 2   sildenafil  (VIAGRA ) 50 MG tablet, Take 1  tablet (50 mg total) by mouth daily as needed for erectile dysfunction. (Patient taking differently: Take 100 mg by mouth daily as needed for erectile dysfunction.), Disp: 30 tablet, Rfl: 3   Meds ordered this encounter  Medications   lisinopril (ZESTRIL) 10 MG tablet    Sig: Take 1 tablet (10 mg total) by mouth every evening.    Dispense:  30 tablet    Refill:  6     There are no discontinued medications.   ASSESSMENT AND PLAN: .      ICD-10-CM   1. Coronary artery disease of native artery of native heart with stable angina pectoris (HCC)  I25.118 EKG 12-Lead    Cardiac Stress Test: Informed Consent Details: Physician/Practitioner Attestation; Transcribe to consent form and obtain patient signature    MYOCARDIAL PERFUSION IMAGING    2. Pure hypercholesterolemia  E78.00     3. Primary hypertension  I10 Basic Metabolic Panel (BMET)    lisinopril (ZESTRIL) 10 MG tablet    4. White coat syndrome with diagnosis of hypertension  I10       Assessment and Plan Assessment & Plan Coronary artery disease with history of bypass   He has a history of coronary artery disease with bypass surgery in 2008. He reports intermittent chest pain, most recently during non-strenuous activity. Previous cardiac catheterization revealed a proximal LAD blockage, leading to a one-vessel bypass. The recent chest pain suggests potential cardiac issues, necessitating further evaluation.  An exercise nuclear stress test is ordered to assess cardiac function and check for blockages. He should hold metoprolol  one day before and on the day of the stress test. Lisinopril 10 mg once daily is prescribed to support cardiovascular health and stabilize blood vessels. Potential side effects, including a cough, were discussed, with a 6% incidence in African Americans and less than 1% in Caucasians. He is advised to monitor for allergic reactions and take the first doses in the afternoon.  White coat hypertension   His blood  pressure readings at home are normal, but elevated in clinical settings, indicating white coat hypertension. Home blood pressure cuff readings align with the physician's machine, confirming accuracy. He should bring his blood pressure cuff to future appointments for comparison.  In spite of well-controlled hypertension at home, I have started him on lisinopril 10 mg daily, side effects discussed, obtain BMP in 2 to 3 weeks.  Obesity   He reports difficulty losing weight, attributed to decreased activity following back surgery in October. He engages in physical activities such as woodworking  and gardening, achieving 10,000 to 22,000 steps daily.  Follow-up   Follow-up plans are discussed to monitor cardiac health and medication tolerance. A follow-up appointment is scheduled in two months, or sooner if stress test results indicate significant issues. A BMP will be performed in two weeks to monitor lisinopril effects.     Signed,  Knox Perl, MD, [facility] 10/03/2023, 7:33 AM [facility] [address] [place], [place] [address] Phone: [phone]. Fax:  [phone]

## 2023-10-02 NOTE — Patient Instructions (Signed)
 Medication Instructions:  Your physician has recommended you make the following change in your medication: Start lisinopril 10 mg by mouth daily in the evening   *If you need a refill on your cardiac medications before your next appointment, please call your pharmacy*  Lab Work: Have lab work (BMP) checked in 2 weeks.  This can be done at any [facility] location. There is an office on the first floor of our building.  It is not fasting If you have labs (blood work) drawn today and your tests are completely normal, you will receive your results only by: MyChart Message (if you have MyChart) OR A paper copy in the mail If you have any lab test that is abnormal or we need to change your treatment, we will call you to review the results.  Testing/Procedures: Your physician has requested that you have an exercise stress myoview . For further information please visit [email or web address] Please follow instruction sheet, as given.   Follow-Up: At [facility], you and your health needs are our priority.  As part of our continuing mission to provide you with exceptional heart care, our providers are all part of one team.  This team includes your primary Cardiologist (physician) and Advanced Practice Providers or APPs (Physician Assistants and Nurse Practitioners) who all work together to provide you with the care you need, when you need it.  Your next appointment:   2 month(s)  Provider:   Knox Perl, MD    We recommend signing up for the patient portal called "MyChart".  Sign up information is provided on this After Visit Summary.  MyChart is used to connect with patients for Virtual Visits (Telemedicine).  Patients are able to view lab/test results, encounter notes, upcoming appointments, etc.  Non-urgent messages can be sent to your provider as well.   To learn more about what you can do with MyChart, go to [email or web address].   Other Instructions

## 2023-10-08 ENCOUNTER — Other Ambulatory Visit: Payer: Self-pay | Admitting: Cardiology

## 2023-10-08 DIAGNOSIS — I25118 Atherosclerotic heart disease of native coronary artery with other forms of angina pectoris: Secondary | ICD-10-CM

## 2023-10-16 ENCOUNTER — Ambulatory Visit (HOSPITAL_COMMUNITY)
Admission: RE | Admit: 2023-10-16 | Discharge: 2023-10-16 | Disposition: A | Discharge status: Home / Self Care | Source: Ambulatory Visit | Attending: Cardiology | Admitting: Cardiology

## 2023-10-16 ENCOUNTER — Ambulatory Visit: Payer: Self-pay | Admitting: Cardiology

## 2023-10-16 DIAGNOSIS — I25118 Atherosclerotic heart disease of native coronary artery with other forms of angina pectoris: Secondary | ICD-10-CM | POA: Insufficient documentation

## 2023-10-16 LAB — MYOCARDIAL PERFUSION IMAGING
LV dias vol: 130 mL (ref 62–150)
LV sys vol: 53 mL
Nuc Stress EF: 59 %
Peak HR: 100 {beats}/min
Rest HR: 44 {beats}/min
Rest Nuclear Isotope Dose: 10.6 mCi
SDS: 0
SRS: 1
SSS: 0
ST Depression (mm): 0 mm
Stress Nuclear Isotope Dose: 32.5 mCi
TID: 1.11

## 2023-10-16 MED ORDER — TECHNETIUM TC 99M TETROFOSMIN IV KIT
32.5000 | PACK | Freq: Once | INTRAVENOUS | Status: AC | PRN
  Administered 2023-10-16: 32.5 via INTRAVENOUS

## 2023-10-16 MED ORDER — REGADENOSON 0.4 MG/5ML IV SOLN
INTRAVENOUS | Status: AC
Start: 2023-10-16 — End: ?
  Filled 2023-10-16: qty 5

## 2023-10-16 MED ORDER — REGADENOSON 0.4 MG/5ML IV SOLN
0.4000 mg | Freq: Once | INTRAVENOUS | Status: AC
  Administered 2023-10-16: 0.4 mg via INTRAVENOUS

## 2023-10-16 MED ORDER — TECHNETIUM TC 99M TETROFOSMIN IV KIT
10.6000 | PACK | Freq: Once | INTRAVENOUS | Status: AC | PRN
  Administered 2023-10-16: 10.6 via INTRAVENOUS

## 2023-10-16 NOTE — Progress Notes (Signed)
 Normal stress test

## 2023-10-17 LAB — BASIC METABOLIC PANEL WITH GFR
BUN/Creatinine Ratio: 21 (ref 10–24)
BUN: 19 mg/dL (ref 8–27)
CO2: 23 mmol/L (ref 20–29)
Calcium: 9.1 mg/dL (ref 8.6–10.2)
Chloride: 104 mmol/L (ref 96–106)
Creatinine, Ser: 0.92 mg/dL (ref 0.76–1.27)
Glucose: 109 mg/dL — ABNORMAL HIGH (ref 70–99)
Potassium: 4.1 mmol/L (ref 3.5–5.2)
Sodium: 143 mmol/L (ref 134–144)
eGFR: 87 mL/min/{1.73_m2} (ref >59)

## 2023-10-17 NOTE — Progress Notes (Signed)
 Normal labs on Lisinopril  and no change in kidney function.

## 2023-10-30 ENCOUNTER — Telehealth: Payer: Self-pay | Admitting: Neurology

## 2023-10-30 NOTE — Telephone Encounter (Signed)
 Pt called to reschedule appt .  Appt Scheduled

## 2023-11-09 ENCOUNTER — Ambulatory Visit: Admitting: Neurology

## 2023-11-15 ENCOUNTER — Ambulatory Visit: Payer: Medicare Other | Admitting: Nurse Practitioner

## 2023-12-03 ENCOUNTER — Ambulatory Visit: Attending: Cardiology | Admitting: Cardiology

## 2023-12-03 ENCOUNTER — Encounter: Payer: Self-pay | Admitting: Cardiology

## 2023-12-03 VITALS — BP 138/63 | HR 50 | Resp 16 | Ht 66.0 in | Wt 205.2 lb

## 2023-12-03 DIAGNOSIS — I10 Essential (primary) hypertension: Secondary | ICD-10-CM | POA: Diagnosis not present

## 2023-12-03 DIAGNOSIS — I25118 Atherosclerotic heart disease of native coronary artery with other forms of angina pectoris: Secondary | ICD-10-CM | POA: Diagnosis not present

## 2023-12-03 DIAGNOSIS — E7800 Pure hypercholesterolemia, unspecified: Secondary | ICD-10-CM | POA: Diagnosis not present

## 2023-12-03 MED ORDER — LOSARTAN POTASSIUM 25 MG PO TABS: 25.0000 mg | ORAL_TABLET | Freq: Every day | ORAL | 1 refills | Status: DC

## 2023-12-03 NOTE — Patient Instructions (Addendum)
 Medication Instructions:  Your physician has recommended you make the following change in your medication: Stop lisinopril  Start losartan  25 mg by mouth daily  .  *If you need a refill on your cardiac medications before your next appointment, please call your pharmacy*  Lab Work: none If you have labs (blood work) drawn today and your tests are completely normal, you will receive your results only by: MyChart Message (if you have MyChart) OR A paper copy in the mail If you have any lab test that is abnormal or we need to change your treatment, we will call you to review the results.  Testing/Procedures: none  Follow-Up: At [facility], you and your health needs are our priority.  As part of our continuing mission to provide you with exceptional heart care, our providers are all part of one team.  This team includes your primary Cardiologist (physician) and Advanced Practice Providers or APPs (Physician Assistants and Nurse Practitioners) who all work together to provide you with the care you need, when you need it.  Your next appointment:   12 month(s)  Provider:   Gordy Bergamo, MD    We recommend signing up for the patient portal called MyChart.  Sign up information is provided on this After Visit Summary.  MyChart is used to connect with patients for Virtual Visits (Telemedicine).  Patients are able to view lab/test results, encounter notes, upcoming appointments, etc.  Non-urgent messages can be sent to your provider as well.   To learn more about what you can do with MyChart, go to [email or web address].   Other Instructions

## 2023-12-03 NOTE — Progress Notes (Unsigned)
 Cardiology Office Note:  .   Date:  12/03/2023  ID:  [name], DOB 21-Apr-1951, MRN [id number] PCP: Randeen Laine LABOR, MD   [facility] Providers Cardiologist:  Gordy Bergamo, MD { Click to update primary MD,subspecialty MD or APP then REFRESH:1}  History of Present Illness: .   [name] is a 73 y.o. Caucasian male patient with coronary artery disease and single-vessel LIMA to LAD bypass surgery in 2008, cardiac catheterization at that time revealing a ostial LAD high-grade stenosis but otherwise no disease, hypertension, hypercholesterolemia established with me on 10/02/2023 presents for 67-month office visit.  Continues to remain active and likes woodworking.  On his prior office visit, I had ordered a stress test in view of chest pain and also started him on lisinopril  10 mg daily due to elevated blood pressure and for CV protection.  Discussed the use of AI scribe software for clinical note transcription with the patient, who gave verbal consent to proceed.  History of Present Illness   Labs   Lab Results  Component Value Date   CHOL 135 06/08/2023   HDL 44 06/08/2023   LDLCALC 71 06/08/2023   TRIG 121 06/08/2023   CHOLHDL 3.1 06/08/2023   No results found for: LIPOA  Lab Results  Component Value Date   NA 143 10/16/2023   K 4.1 10/16/2023   CO2 23 10/16/2023   GLUCOSE 109 (H) 10/16/2023   BUN 19 10/16/2023   CREATININE 0.92 10/16/2023   CALCIUM  9.1 10/16/2023   EGFR 87 10/16/2023   GFRNONAA >60 07/11/2022      Latest Ref Rng & Units 10/16/2023    1:07 PM 06/08/2023   11:25 AM 02/13/2023   10:05 AM  BMP  Glucose 70 - 99 mg/dL 890  892  892   BUN 8 - 27 mg/dL 19  15  20    Creatinine 0.76 - 1.27 mg/dL 9.07  9.13  9.12   BUN/Creat Ratio 10 - 24 21  SEE NOTE:  SEE NOTE:   Sodium 134 - 144 mmol/L 143  139  140   Potassium 3.5 - 5.2 mmol/L 4.1  4.3  3.8   Chloride 96 - 106 mmol/L 104  103  103   CO2 20 - 29 mmol/L 23  30  29    Calcium  8.6 - 10.2 mg/dL  9.1  9.3  9.6       Latest Ref Rng & Units 06/08/2023   11:25 AM 02/13/2023   10:05 AM 07/11/2022    7:15 AM  CBC  WBC 3.8 - 10.8 Thousand/uL 3.4  4.0  8.1   Hemoglobin 13.2 - 17.1 g/dL 85.7  85.5  85.9   Hematocrit 38.5 - 50.0 % 41.1  43.1  39.7   Platelets 140 - 400 Thousand/uL 180  174  138    Lab Results  Component Value Date   HGBA1C 5.8 (H) 02/13/2023    Lab Results  Component Value Date   TSH 3.92 02/13/2023     ROS  Review of Systems  Cardiovascular:  Negative for chest pain, dyspnea on exertion and leg swelling.   Physical Exam:   VS:  BP 138/63 (BP Location: Left Arm, Patient Position: Sitting, Cuff Size: Large)   Pulse (!) 50   Resp 16   Ht 5' 6 (1.676 m)   Wt 205 lb 3.2 oz (93.1 kg)   SpO2 97%   BMI 33.12 kg/m    Wt Readings from Last 3 Encounters:  12/03/23 205  lb 3.2 oz (93.1 kg)  10/02/23 207 lb (93.9 kg)  08/22/23 203 lb (92.1 kg)       12/03/2023    9:58 AM 10/02/2023    1:38 PM 08/22/2023   11:29 AM  Vitals with BMI  Height 5' 6 5' 6   Weight 205 lbs 3 oz 207 lbs   BMI 33.14 33.43   Systolic 138 162 870  Diastolic 63 74 70  Pulse 50 66    Physical Exam Neck:     Vascular: No carotid bruit or JVD.  Cardiovascular:     Rate and Rhythm: Normal rate and regular rhythm.     Pulses: Intact distal pulses.     Heart sounds: Normal heart sounds. No murmur heard.    No gallop.  Pulmonary:     Effort: Pulmonary effort is normal.     Breath sounds: Normal breath sounds.  Abdominal:     General: Bowel sounds are normal.     Palpations: Abdomen is soft.  Musculoskeletal:     Right lower leg: No edema.     Left lower leg: No edema.    Studies Reviewed: .    MYOCARDIAL PERFUSION IMAGING 10/16/2023   Interpretation Summary   The study is normal. The study is low risk.   No ST deviation was noted with Lexiscan  infusion.   LV perfusion is normal. There is no evidence of ischemia. There is no evidence of infarction.   Left ventricular function  is normal. Nuclear stress EF: 59%. The left ventricular ejection fraction is normal (55-65%). End diastolic cavity size is normal. End systolic cavity size is normal. No evidence of transient ischemic dilation (TID) noted.   CT images were obtained for attenuation correction and were examined for the presence of coronary calcium  when appropriate.   Coronary calcium  assessment not performed due to prior revascularization.   Prior study not available for comparison. EKG:       EKG 10/02/2023: Normal sinus rhythm/sinus bradycardia at rate of 49 bpm, incomplete right bundle branch block.  Normal EKG.  No change from 07/11/2022.   Medications ordered    Meds ordered this encounter  Medications   losartan  (COZAAR ) 25 MG tablet    Sig: Take 1 tablet (25 mg total) by mouth daily.    Dispense:  30 tablet    Refill:  1     ASSESSMENT AND PLAN: .      ICD-10-CM   1. Coronary artery disease of native artery of native heart with stable angina pectoris (HCC)  I25.118 losartan  (COZAAR ) 25 MG tablet    2. Primary hypertension  I10 losartan  (COZAAR ) 25 MG tablet    3. Pure hypercholesterolemia  E78.00      Assessment & Plan      Signed,  Gordy Bergamo, MD, [facility] 12/03/2023, 9:38 PM [facility] [address] [place], [place] [address] Phone: [phone]. Fax:  [phone]

## 2023-12-13 ENCOUNTER — Ambulatory Visit: Payer: Medicare Other | Admitting: Nurse Practitioner

## 2023-12-24 ENCOUNTER — Other Ambulatory Visit: Payer: Self-pay | Admitting: *Deleted

## 2023-12-24 DIAGNOSIS — I10 Essential (primary) hypertension: Secondary | ICD-10-CM

## 2023-12-24 MED ORDER — METOPROLOL SUCCINATE ER 100 MG PO TB24: ORAL_TABLET | ORAL | 1 refills | Status: DC

## 2023-12-26 ENCOUNTER — Other Ambulatory Visit: Payer: Self-pay | Admitting: Family Medicine

## 2023-12-26 ENCOUNTER — Other Ambulatory Visit: Payer: Self-pay

## 2023-12-26 DIAGNOSIS — G40909 Epilepsy, unspecified, not intractable, without status epilepticus: Secondary | ICD-10-CM

## 2023-12-26 DIAGNOSIS — I25118 Atherosclerotic heart disease of native coronary artery with other forms of angina pectoris: Secondary | ICD-10-CM

## 2023-12-26 DIAGNOSIS — I10 Essential (primary) hypertension: Secondary | ICD-10-CM

## 2023-12-26 MED ORDER — LOSARTAN POTASSIUM 25 MG PO TABS: 25.0000 mg | ORAL_TABLET | Freq: Every day | ORAL | 3 refills | Status: AC

## 2023-12-26 NOTE — Telephone Encounter (Signed)
 Copied from CRM #[id number]. Topic: Clinical - Medication Refill >> Dec 26, 2023 10:21 AM Henretta I wrote: Medication:  carbamazepine  (CARBATROL ) 300 MG 12 hr capsule   Has the patient contacted their pharmacy? Yes, they stated the approval has not come through yet  (Agent: If no, request that the patient contact the pharmacy for the refill. If patient does not wish to contact the pharmacy document the reason why and proceed with request.) (Agent: If yes, when and what did the pharmacy advise?)  This is the patient's preferred pharmacy:  [facility] - [place], [place] - [address] [facility] [address] [place] [address] [place] [address] Phone: [phone] Fax: [phone]   Is this the correct pharmacy for this prescription? Yes If no, delete pharmacy and type the correct one.   Has the prescription been filled recently? No  Is the patient out of the medication? No, patient does have appt for neurologists but it is not until September   Has the patient been seen for an appointment in the last year OR does the patient have an upcoming appointment? Yes  Can we respond through MyChart? Yes  Agent: Please be advised that Rx refills may take up to 3 business days. We ask that you follow-up with your pharmacy.

## 2023-12-28 NOTE — Telephone Encounter (Signed)
 PCP hasn't filled this before will route to her for review   CPE scheduled 04/22/24

## 2023-12-30 MED ORDER — CARBAMAZEPINE ER 300 MG PO CP12: ORAL_CAPSULE | ORAL | 2 refills | Status: DC

## 2024-01-29 ENCOUNTER — Other Ambulatory Visit: Payer: Self-pay

## 2024-01-29 DIAGNOSIS — Z23 Encounter for immunization: Secondary | ICD-10-CM

## 2024-01-29 NOTE — Telephone Encounter (Signed)
 Patient will need to call and find out which one has it.

## 2024-01-29 NOTE — Telephone Encounter (Signed)
 Copied from CRM [id number]. Topic: Clinical - Medication Question >> Jan 29, 2024 11:36 AM [name] wrote: Reason for CRM:  Patient requesting covid vaccine. If [facility] not [facility] any send to [facility] on [address] [employer]

## 2024-01-30 MED ORDER — COVID-19 MRNA VACC (MODERNA) 50 MCG/0.5ML IM SUSP: 0.5000 mL | Freq: Once | INTRAMUSCULAR | 0 refills | Status: AC

## 2024-01-30 NOTE — Telephone Encounter (Signed)
Done and sent to provider

## 2024-01-30 NOTE — Telephone Encounter (Signed)
 Left detailed message for patient to make him aware of recommendations. Advised patient if he has any questions, please give our office a call back.   OK for E2C2 to relay note if pt calls back. If relayed, please notify the office.

## 2024-01-30 NOTE — Telephone Encounter (Unsigned)
 Copied from CRM [id number]. Topic: Clinical - Medication Question >> Jan 29, 2024 11:36 AM Deleta RAMAN wrote: Reason for CRM:  Patient requesting covid vaccine. If [facility] not [facility] any send to [facility] on [address] [place] >> Jan 30, 2024  2:28 PM Chiquita SQUIBB wrote: Patient is requesting this be sent to the [facility] on [address]

## 2024-01-30 NOTE — Addendum Note (Signed)
 Addended by: RANDEEN HARDY A on: 01/30/2024 04:08 PM   Modules accepted: Orders

## 2024-01-30 NOTE — Telephone Encounter (Signed)
 Please que it up and I will sign to send  Thanks

## 2024-02-04 ENCOUNTER — Ambulatory Visit (INDEPENDENT_AMBULATORY_CARE_PROVIDER_SITE_OTHER): Admitting: Neurology

## 2024-02-04 ENCOUNTER — Encounter: Payer: Self-pay | Admitting: Neurology

## 2024-02-04 VITALS — BP 126/60 | HR 60 | Ht 68.0 in | Wt 203.0 lb

## 2024-02-04 DIAGNOSIS — Z5181 Encounter for therapeutic drug level monitoring: Secondary | ICD-10-CM | POA: Diagnosis not present

## 2024-02-04 DIAGNOSIS — G40109 Localization-related (focal) (partial) symptomatic epilepsy and epileptic syndromes with simple partial seizures, not intractable, without status epilepticus: Secondary | ICD-10-CM | POA: Diagnosis not present

## 2024-02-04 MED ORDER — CARBAMAZEPINE ER 300 MG PO CP12: ORAL_CAPSULE | ORAL | 4 refills | Status: AC

## 2024-02-04 NOTE — Progress Notes (Signed)
 [facility]  PATIENT: [name] DOB: 18-Mar-1951  REQUESTING CLINICIAN: Tower, Laine LABOR, MD HISTORY FROM: Patient and spouse  REASON FOR VISIT: Focal epilepsy    HISTORICAL  CHIEF COMPLAINT:  Chief Complaint  Patient presents with   New Patient (Initial Visit)    RM 13 seizures, in room with wife    HISTORY OF PRESENT ILLNESS:  This is 74 year old gentleman with a history of focal epilepsy diagnosed in his 85s, hypertension, hyperlipidemia who is presenting to establish care.  Patient was previously seen by Dr. Tanda in 2014 at that time he reported his last seizure was more than 9 years ago.  He was doing well on Carbatrol  300 mg twice daily.  His previous PCP used to refil his medication.  Currently he is taking Carbatrol  300 mg daily, denies any side effect, no seizures.  He described his previous seizures as starting with aura of dj vu then he will have loss of consciousness and generalized convulsion. Wife reports the conversion is more so with the left side of his body.  He did have an accident in his 11s but other than that no other seizure risk factor noted.  Patient tells me that his PCP previously was refilling his medication but he now has a new PCP who wants him to see neurology to get his antiseizure medication.  Currently he has no complaints.  Doing very well. His last seizure was more than 25 years ago.     Handedness: Right handed   Onset: Late 57s   Seizure Type: Aura of deja vu, then generalized convulsion   Current frequency: Last seizure more than 20 years  Any injuries from seizures: Denies   Seizure risk factors: Head injury in his 18  Previous ASMs: Phenytoin  Currenty ASMs: Carbatrol  300 mg daily   ASMs side effects: Denies   Brain Images: Not available for review   Previous EEGs: Not available for review    OTHER MEDICAL CONDITIONS: Focal epilepsy   REVIEW OF SYSTEMS: Full 14 system review of systems performed  and negative with exception of: As noted in the HPI.  ALLERGIES: Allergies  Allergen Reactions   Lisinopril  Cough   Lipitor [Atorvastatin]     myalgia    HOME MEDICATIONS: Outpatient Medications Prior to Visit  Medication Sig Dispense Refill   aspirin 81 MG tablet Take 81 mg by mouth daily.     Cholecalciferol (VITAMIN D  PO) Take 10,000 Units by mouth daily.     fish oil-omega-3 fatty acids 1000 MG capsule Take 1 g by mouth daily.     losartan  (COZAAR ) 25 MG tablet Take 1 tablet (25 mg total) by mouth daily. 90 tablet 3   LUTEIN PO Take by mouth daily.     Magnesium 250 MG TABS Take 250 mg by mouth every other day.     metoprolol  succinate (TOPROL -XL) 100 MG 24 hr tablet Take 1 tablet by mouth Daily for BP & Heart 90 tablet 1   rosuvastatin  (CRESTOR ) 20 MG tablet TAKE 1 TABLET BY MOUTH DAILY FOR CHOLESTEROL 100 tablet 2   sildenafil  (VIAGRA ) 50 MG tablet Take 1 tablet (50 mg total) by mouth daily as needed for erectile dysfunction. (Patient taking differently: Take 100 mg by mouth daily as needed for erectile dysfunction.) 30 tablet 3   carbamazepine  (CARBATROL ) 300 MG 12 hr capsule Take  1 capsule  Daily  to Prevent  ' Absence Seizures ' 100 capsule 2   No facility-administered medications  prior to visit.    PAST MEDICAL HISTORY: Past Medical History:  Diagnosis Date   Bilateral carpal tunnel syndrome    Dyslipidemia    Hyperlipidemia    Ischemic heart disease    Plantar fasciitis    Seizure disorder (HCC)    Vitamin D  deficiency     PAST SURGICAL HISTORY: Past Surgical History:  Procedure Laterality Date   CHOLECYSTECTOMY     CORONARY ARTERY BYPASS GRAFT  08/2006   HAND SURGERY     LAPAROSCOPIC APPENDECTOMY N/A 07/11/2022   Procedure: APPENDECTOMY LAPAROSCOPIC;  Surgeon: Rubin Calamity, MD;  Location: [facility] OR;  Service: General;  Laterality: N/A;    FAMILY HISTORY: Family History  Problem Relation Age of Onset   Hypertension Mother    Stroke Mother    Diabetes  Father    Lupus Sister     SOCIAL HISTORY: Social History   Socioeconomic History   Marital status: Married    Spouse name: Not on file   Number of children: 2   Years of education: Not on file   Highest education level: Not on file  Occupational History   Occupation: Licensed Optician  Tobacco Use   Smoking status: Former    Current packs/day: 0.00    Types: Cigarettes    Quit date: 05/22/1968    Years since quitting: 55.7   Smokeless tobacco: Never  Vaping Use   Vaping status: Never Used  Substance and Sexual Activity   Alcohol use: Yes    Comment: very rarely   Drug use: No   Sexual activity: Not on file  Other Topics Concern   Not on file  Social History Narrative   Patient lives at home with his wife Joya)  Jo[place] and works full time. Patient has two children.   Social Drivers of Corporate investment banker Strain: Not on file  Food Insecurity: Not on file  Transportation Needs: Not on file  Physical Activity: Not on file  Stress: Not on file  Social Connections: Not on file  Intimate Partner Violence: Not on file   PHYSICAL EXAM  GENERAL EXAM/CONSTITUTIONAL: Vitals:  Vitals:   02/04/24 0956 02/04/24 0958  BP: (!) 146/62 126/60  Pulse: 60   SpO2: 98%   Weight: 203 lb (92.1 kg)   Height: 5' 8 (1.727 m)    Body mass index is 30.87 kg/m. Wt Readings from Last 3 Encounters:  02/04/24 203 lb (92.1 kg)  12/03/23 205 lb 3.2 oz (93.1 kg)  10/02/23 207 lb (93.9 kg)   Patient is in no distress; well developed, nourished and groomed; neck is supple  MUSCULOSKELETAL: Gait, strength, tone, movements noted in Neurologic exam below  NEUROLOGIC: MENTAL STATUS:      No data to display         awake, alert, oriented to person, place and time recent and remote memory intact normal attention and concentration language fluent, comprehension intact, naming intact fund of knowledge appropriate  CRANIAL NERVE:  2nd, 3rd, 4th, 6th - Visual fields full  to confrontation, extraocular muscles intact, no nystagmus 5th - facial sensation symmetric 7th - facial strength symmetric 8th - hearing intact 9th - palate elevates symmetrically, uvula midline 11th - shoulder shrug symmetric 12th - tongue protrusion midline  MOTOR:  normal bulk and tone, full strength in the BUE, BLE  SENSORY:  normal and symmetric to light touch  COORDINATION:  finger-nose-finger, fine finger movements normal  GAIT/STATION:  normal   DIAGNOSTIC DATA (LABS, IMAGING, TESTING) -  I reviewed patient records, labs, notes, testing and imaging myself where available.  Lab Results  Component Value Date   WBC 3.4 (L) 06/08/2023   HGB 14.2 06/08/2023   HCT 41.1 06/08/2023   MCV 91.5 06/08/2023   PLT 180 06/08/2023      Component Value Date/Time   NA 143 10/16/2023 1307   K 4.1 10/16/2023 1307   CL 104 10/16/2023 1307   CO2 23 10/16/2023 1307   GLUCOSE 109 (H) 10/16/2023 1307   GLUCOSE 107 (H) 06/08/2023 1125   BUN 19 10/16/2023 1307   CREATININE 0.92 10/16/2023 1307   CREATININE 0.86 06/08/2023 1125   CALCIUM  9.1 10/16/2023 1307   PROT 6.9 06/08/2023 1125   ALBUMIN 3.8 07/11/2022 0715   AST 19 06/08/2023 1125   ALT 18 06/08/2023 1125   ALKPHOS 60 07/11/2022 0715   BILITOT 0.3 06/08/2023 1125   GFRNONAA >60 07/11/2022 0715   GFRNONAA 71 08/24/2020 1058   GFRAA 82 08/24/2020 1058   Lab Results  Component Value Date   CHOL 135 06/08/2023   HDL 44 06/08/2023   LDLCALC 71 06/08/2023   TRIG 121 06/08/2023   Lab Results  Component Value Date   HGBA1C 5.8 (H) 02/13/2023   No results found for: VITAMINB12 Lab Results  Component Value Date   TSH 3.92 02/13/2023    ASSESSMENT AND PLAN  73 y.o. year old male  with history of focal epilepsy, hypertension, hyperlipidemia who is presenting for management of his seizures.  He is doing well on Carbatrol  300 mg daily, his last seizure was more than 25 years ago.  He has a new PCP who wants him to see  neurology to obtain his medication.  Plan for patient is to continue his current medications which is Carbajal 300 mg daily, I will give him a refill and we will see him on a yearly basis.  We also discussed discontinuation of the medication since has not had a seizure for the past 25 years but at the moment he prefers to stay on the medication.  All other questions answered.  I will see him in a year for follow-up or sooner if worse.   1. Focal epilepsy (HCC)   2. Therapeutic drug monitoring     Patient Instructions  Continue with Carbatrol  XR 300 mg daily, refill given Continue your other medications Will get a carbamazepine  level with BMP and vitamin D  Return in a year or sooner if worse   Per [place] statutes, patients with seizures are not allowed to drive until they have been seizure-free for six months.  Other recommendations include using caution when using heavy equipment or power tools. Avoid working on ladders or at heights. Take showers instead of baths.  Do not swim alone.  Ensure the water temperature is not too high on the home water heater. Do not go swimming alone. Do not lock yourself in a room alone (i.e. bathroom). When caring for infants or small children, sit down when holding, feeding, or changing them to minimize risk of injury to the child in the event you have a seizure. Maintain good sleep hygiene. Avoid alcohol.  Also recommend adequate sleep, hydration, good diet and minimize stress.   During the Seizure  - First, ensure adequate ventilation and place patients on the floor on their left side  Loosen clothing around the neck and ensure the airway is patent. If the patient is clenching the teeth, do not force the mouth open with any  object as this can cause severe damage - Remove all items from the surrounding that can be hazardous. The patient may be oblivious to what's happening and may not even know what he or she is doing. If the patient is confused and  wandering, either gently guide him/her away and block access to outside areas - Reassure the individual and be comforting - Call 911. In most cases, the seizure ends before EMS arrives. However, there are cases when seizures may last over 3 to 5 minutes. Or the individual may have developed breathing difficulties or severe injuries. If a pregnant patient or a person with diabetes develops a seizure, it is prudent to call an ambulance. - Finally, if the patient does not regain full consciousness, then call EMS. Most patients will remain confused for about 45 to 90 minutes after a seizure, so you must use judgment in calling for help. - Avoid restraints but make sure the patient is in a bed with padded side rails - Place the individual in a lateral position with the neck slightly flexed; this will help the saliva drain from the mouth and prevent the tongue from falling backward - Remove all nearby furniture and other hazards from the area - Provide verbal assurance as the individual is regaining consciousness - Provide the patient with privacy if possible - Call for help and start treatment as ordered by the caregiver   After the Seizure (Postictal Stage)  After a seizure, most patients experience confusion, fatigue, muscle pain and/or a headache. Thus, one should permit the individual to sleep. For the next few days, reassurance is essential. Being calm and helping reorient the person is also of importance.  Most seizures are painless and end spontaneously. Seizures are not harmful to others but can lead to complications such as stress on the lungs, brain and the heart. Individuals with prior lung problems may develop labored breathing and respiratory distress.    Discussed Patients with epilepsy have a small risk of sudden unexpected death, a condition referred to as sudden unexpected death in epilepsy (SUDEP). SUDEP is defined specifically as the sudden, unexpected, witnessed or unwitnessed,  nontraumatic and nondrowning death in patients with epilepsy with or without evidence for a seizure, and excluding documented status epilepticus, in which post mortem examination does not reveal a structural or toxicologic cause for death     Orders Placed This Encounter  Procedures   Carbamazepine  level, total   Basic Metabolic Panel   Vitamin D , 25-hydroxy    Meds ordered this encounter  Medications   carbamazepine  (CARBATROL ) 300 MG 12 hr capsule    Sig: Take  1 capsule  Daily  to Prevent  ' Absence Seizures '    Dispense:  100 capsule    Refill:  4    Return in about 1 year (around 02/03/2025).    Pastor Falling, MD 02/04/2024, 10:57 AM  [facility] [address] [place], [place] [address] [phone]

## 2024-02-04 NOTE — Patient Instructions (Signed)
 Continue with Carbatrol  XR 300 mg daily, refill given Continue your other medications Will get a carbamazepine  level with BMP and vitamin D  Return in a year or sooner if worse

## 2024-02-05 ENCOUNTER — Ambulatory Visit: Payer: Self-pay | Admitting: Neurology

## 2024-02-05 LAB — BASIC METABOLIC PANEL WITH GFR
BUN/Creatinine Ratio: 14 (ref 10–24)
BUN: 14 mg/dL (ref 8–27)
CO2: 26 mmol/L (ref 20–29)
Calcium: 9.1 mg/dL (ref 8.6–10.2)
Chloride: 104 mmol/L (ref 96–106)
Creatinine, Ser: 1.01 mg/dL (ref 0.76–1.27)
Glucose: 111 mg/dL — ABNORMAL HIGH (ref 70–99)
Potassium: 4.2 mmol/L (ref 3.5–5.2)
Sodium: 140 mmol/L (ref 134–144)
eGFR: 78 mL/min/1.73 (ref >59)

## 2024-02-05 LAB — CARBAMAZEPINE LEVEL, TOTAL: Carbamazepine (Tegretol), S: 5.1 ug/mL (ref 4.0–12.0)

## 2024-02-05 LAB — VITAMIN D 25 HYDROXY (VIT D DEFICIENCY, FRACTURES): Vit D, 25-Hydroxy: 64.8 ng/mL (ref 30.0–100.0)

## 2024-02-22 ENCOUNTER — Encounter: Payer: Medicare Other | Admitting: Internal Medicine

## 2024-02-27 ENCOUNTER — Other Ambulatory Visit: Payer: Self-pay | Admitting: Family Medicine

## 2024-02-27 DIAGNOSIS — I10 Essential (primary) hypertension: Secondary | ICD-10-CM

## 2024-02-28 MED ORDER — METOPROLOL SUCCINATE ER 100 MG PO TB24: ORAL_TABLET | ORAL | 1 refills | Status: AC

## 2024-03-14 ENCOUNTER — Other Ambulatory Visit: Payer: Self-pay

## 2024-03-14 MED ORDER — ROSUVASTATIN CALCIUM 20 MG PO TABS: ORAL_TABLET | ORAL | 2 refills | Status: AC

## 2024-03-14 NOTE — Telephone Encounter (Signed)
 Received refill request for Crestor .  Medication last filled by Bascom Necessary, NP [id number].  Patient's last OV 08/22/23 and next OV 04/22/24.

## 2024-03-17 ENCOUNTER — Encounter: Payer: Medicare Other | Admitting: Internal Medicine

## 2024-04-22 ENCOUNTER — Ambulatory Visit (INDEPENDENT_AMBULATORY_CARE_PROVIDER_SITE_OTHER): Admitting: Family Medicine

## 2024-04-22 ENCOUNTER — Ambulatory Visit: Payer: Self-pay | Admitting: Family Medicine

## 2024-04-22 ENCOUNTER — Encounter: Payer: Self-pay | Admitting: Family Medicine

## 2024-04-22 VITALS — BP 133/62 | HR 45 | Temp 97.6°F | Ht 66.5 in | Wt 210.2 lb

## 2024-04-22 DIAGNOSIS — E66811 Obesity, class 1: Secondary | ICD-10-CM

## 2024-04-22 DIAGNOSIS — R7309 Other abnormal glucose: Secondary | ICD-10-CM | POA: Diagnosis not present

## 2024-04-22 DIAGNOSIS — I10 Essential (primary) hypertension: Secondary | ICD-10-CM

## 2024-04-22 DIAGNOSIS — G40909 Epilepsy, unspecified, not intractable, without status epilepticus: Secondary | ICD-10-CM

## 2024-04-22 DIAGNOSIS — E782 Mixed hyperlipidemia: Secondary | ICD-10-CM | POA: Diagnosis not present

## 2024-04-22 DIAGNOSIS — R7989 Other specified abnormal findings of blood chemistry: Secondary | ICD-10-CM | POA: Insufficient documentation

## 2024-04-22 DIAGNOSIS — E559 Vitamin D deficiency, unspecified: Secondary | ICD-10-CM

## 2024-04-22 DIAGNOSIS — N401 Enlarged prostate with lower urinary tract symptoms: Secondary | ICD-10-CM

## 2024-04-22 DIAGNOSIS — Z860100 Personal history of colon polyps, unspecified: Secondary | ICD-10-CM

## 2024-04-22 DIAGNOSIS — Z0000 Encounter for general adult medical examination without abnormal findings: Secondary | ICD-10-CM | POA: Insufficient documentation

## 2024-04-22 LAB — TSH: TSH: 5.59 u[IU]/mL — ABNORMAL HIGH (ref 0.35–5.50)

## 2024-04-22 LAB — COMPREHENSIVE METABOLIC PANEL WITH GFR
ALT: 15 U/L (ref 0–53)
AST: 19 U/L (ref 0–37)
Albumin: 4.5 g/dL (ref 3.5–5.2)
Alkaline Phosphatase: 64 U/L (ref 39–117)
BUN: 18 mg/dL (ref 6–23)
CO2: 31 meq/L (ref 19–32)
Calcium: 9.4 mg/dL (ref 8.4–10.5)
Chloride: 102 meq/L (ref 96–112)
Creatinine, Ser: 0.98 mg/dL (ref 0.40–1.50)
GFR: 75.25 mL/min (ref >60.00)
Glucose, Bld: 108 mg/dL — ABNORMAL HIGH (ref 70–99)
Potassium: 4.1 meq/L (ref 3.5–5.1)
Sodium: 139 meq/L (ref 135–145)
Total Bilirubin: 0.4 mg/dL (ref 0.2–1.2)
Total Protein: 6.8 g/dL (ref 6.0–8.3)

## 2024-04-22 LAB — LIPID PANEL
Cholesterol: 148 mg/dL (ref 0–200)
HDL: 43.3 mg/dL (ref >39.00)
LDL Cholesterol: 71 mg/dL (ref 0–99)
NonHDL: 104.25
Total CHOL/HDL Ratio: 3
Triglycerides: 165 mg/dL — ABNORMAL HIGH (ref 0.0–149.0)
VLDL: 33 mg/dL (ref 0.0–40.0)

## 2024-04-22 LAB — CBC WITH DIFFERENTIAL/PLATELET
Basophils Absolute: 0 K/uL (ref 0.0–0.1)
Basophils Relative: 0.6 % (ref 0.0–3.0)
Eosinophils Absolute: 0.1 K/uL (ref 0.0–0.7)
Eosinophils Relative: 1.6 % (ref 0.0–5.0)
HCT: 41.5 % (ref 39.0–52.0)
Hemoglobin: 14.1 g/dL (ref 13.0–17.0)
Lymphocytes Relative: 27.3 % (ref 12.0–46.0)
Lymphs Abs: 1.1 K/uL (ref 0.7–4.0)
MCHC: 33.9 g/dL (ref 30.0–36.0)
MCV: 92.3 fl (ref 78.0–100.0)
Monocytes Absolute: 0.4 K/uL (ref 0.1–1.0)
Monocytes Relative: 9.9 % (ref 3.0–12.0)
Neutro Abs: 2.5 K/uL (ref 1.4–7.7)
Neutrophils Relative %: 60.6 % (ref 43.0–77.0)
Platelets: 168 K/uL (ref 150.0–400.0)
RBC: 4.5 Mil/uL (ref 4.22–5.81)
RDW: 12.9 % (ref 11.5–15.5)
WBC: 4.1 K/uL (ref 4.0–10.5)

## 2024-04-22 LAB — HEMOGLOBIN A1C: Hgb A1c MFr Bld: 5.7 % (ref 4.6–6.5)

## 2024-04-22 LAB — PSA, MEDICARE: PSA: 0.62 ng/mL (ref 0.10–4.00)

## 2024-04-22 NOTE — Assessment & Plan Note (Signed)
 No changes Reviewed last neuro note No new seizures Continues carbamazepine  300 mg daily

## 2024-04-22 NOTE — Progress Notes (Signed)
 Subjective:    Patient ID: [name], male    DOB: 1951-01-04, 73 y.o.   MRN: [id number]  HPI  Here for health maintenance exam and to review chronic medical problems   Wt Readings from Last 3 Encounters:  04/22/24 210 lb 4 oz (95.4 kg)  02/04/24 203 lb (92.1 kg)  12/03/23 205 lb 3.2 oz (93.1 kg)   33.43 kg/m  Vitals:   04/22/24 1011 04/22/24 1037  BP: (!) 140/68 133/62  Pulse: (!) 45   Temp: 97.6 F (36.4 C)   SpO2: 99%     Immunization History  Administered Date(s) Administered   Fluad Quad(high Dose 65+) 02/24/2022   Hep A, Unspecified 04/29/1998   Hep B, Unspecified 04/29/1998, 05/27/1998   INFLUENZA, HIGH DOSE SEASONAL PF 02/23/2015, 04/21/2017, 05/03/2017, 03/28/2018, 02/27/2019, 05/04/2020, 03/09/2022, 03/12/2023   Influenza-Unspecified 04/29/1998, 01/29/2024   Moderna SARS-COV2 Booster Vaccination 08/25/2020   PFIZER(Purple Top)SARS-COV-2 Vaccination 06/26/2019, 08/04/2019, 02/26/2020   PNEUMOCOCCAL CONJUGATE-20 08/07/2023   Pfizer Covid-19 Vaccine Bivalent Booster 69yrs & up 02/23/2021, 09/13/2021, 03/09/2022   Pfizer(Comirnaty)Fall Seasonal Vaccine 12 years and older 02/24/2022, 03/12/2023, 07/30/2023, 02/05/2024   Pneumococcal Conjugate-13 12/17/2015   Pneumococcal Polysaccharide-23 04/22/2009   Td 05/27/1998, 04/22/2009   Tdap 07/17/2019, 02/05/2024   Yellow Fever 05/27/1998   Zoster, Live 09/09/2009    Health Maintenance Due  Topic Date Due   Zoster Vaccines- Shingrix (1 of 2) 06/16/1967   Has been busy  Working outside  Getting ready for holidays   Has new great grand child   Had flu shot 02/05/24 Tdap 02/05/24 Covid 02/15/24  All at the same [facility] up the road   Shingrix-has not had  Unsure if interested  Zostavax years ago     Prostate health History of BPH with LUTS Lab Results  Component Value Date   PSA 0.36 02/13/2023   PSA 0.33 12/19/2021   PSA 0.35 12/17/2020   Stopped finasteride  due to not effective  No problems  voiding  No nocturia  No prostate cancer in family   Viagra  prn for ED   Colon cancer screening  Colonoscopy normal 11/2022 (had polyps prior to that) with Dr Rollin   Bone health   Falls-none  [place]  Supplements - vitamin D   Last vitamin D -good from neuro  Lab Results  Component Value Date   VD25OH 64.8 02/04/2024    Exercise  Lot of walking last week was averaging 4 miles per day /most of the year  Works outside  Has some dumb bells  Some push ups   Sees derm yearly  Uses sun protection  Had some areas treated with cryo- ears/head    Mood    04/22/2024   10:16 AM 08/07/2023   12:01 PM 06/08/2023   11:08 AM 02/12/2023    9:46 PM 06/28/2022   11:25 AM  Depression screen PHQ 2/9  Decreased Interest 0 0 0 0 0  Down, Depressed, Hopeless 0 0 0 0 0  PHQ - 2 Score 0 0 0 0 0  Altered sleeping 0      Tired, decreased energy 0      Change in appetite 0      Feeling bad or failure about yourself  0      Trouble concentrating 0      Moving slowly or fidgety/restless 0      Suicidal thoughts 0      PHQ-9 Score 0      Difficult doing work/chores Not difficult at all  HTN bp is stable today  No cp or palpitations or headaches or edema  No side effects to medicines  BP Readings from Last 3 Encounters:  04/22/24 133/62  02/04/24 126/60  12/03/23 138/63    Metoprolol  xl 100 mg daily  Losartan  25 mg daily from cardiology   Lab Results  Component Value Date   NA 140 02/04/2024   K 4.2 02/04/2024   CO2 26 02/04/2024   GLUCOSE 111 (H) 02/04/2024   BUN 14 02/04/2024   CREATININE 1.01 02/04/2024   CALCIUM  9.1 02/04/2024   EGFR 78 02/04/2024   GFRNONAA >60 07/11/2022   CAD Takes 81 mg asa    History of seizure disorder Carbamazepine  from neurology No new seizures   Hyperlipidemia Lab Results  Component Value Date   CHOL 135 06/08/2023   HDL 44 06/08/2023   LDLCALC 71 06/08/2023   TRIG 121 06/08/2023   CHOLHDL 3.1 06/08/2023   Crestor  20  mg daily  Omega 3 suppl 1 g daily   Prediabetes Lab Results  Component Value Date   HGBA1C 5.8 (H) 02/13/2023   HGBA1C 5.9 (H) 06/28/2022   HGBA1C 5.6 12/19/2021    Has a vein occlusion in right eye  ? What cause was  20/40 vision in that eye   Diet  Trying to eat healthy  Veggies Chicken Some eggs    Avoids fried food      Patient Active Problem List   Diagnosis Date Noted   Routine general medical examination at a health care facility 04/22/2024   Erectile dysfunction 08/07/2023   BPH (benign prostatic hyperplasia) 08/07/2023   Gastroesophageal reflux disease 08/24/2020   History of colonic polyps 11/07/2018   Other abnormal glucose (prediabetes) 02/23/2015   Obesity (BMI 30.0-34.9) 01/29/2014   Vitamin D  deficiency 04/03/2013   CAD (coronary artery disease) 10/01/2011   Essential hypertension 08/31/2010   Seizure disorder (HCC)    Hyperlipidemia    Past Medical History:  Diagnosis Date   Bilateral carpal tunnel syndrome    Dyslipidemia    Hyperlipidemia    Ischemic heart disease    Plantar fasciitis    Seizure disorder (HCC)    Vitamin D  deficiency    Past Surgical History:  Procedure Laterality Date   CHOLECYSTECTOMY     CORONARY ARTERY BYPASS GRAFT  08/2006   HAND SURGERY     LAPAROSCOPIC APPENDECTOMY N/A 07/11/2022   Procedure: APPENDECTOMY LAPAROSCOPIC;  Surgeon: Rubin Calamity, MD;  Location[facility] OR;  Service: General;  Laterality: N/A;   Social History   Tobacco Use   Smoking status: Former    Current packs/day: 0.00    Types: Cigarettes    Quit date: 05/22/1968    Years since quitting: 55.9   Smokeless tobacco: Never  Vaping Use   Vaping status: Never Used  Substance Use Topics   Alcohol use: Yes    Comment: very rarely   Drug use: No   Family History  Problem Relation Age of Onset   Hypertension Mother    Stroke Mother    Diabetes Father    Lupus Sister    Allergies  Allergen Reactions   Lisinopril  Cough   Lipitor  [Atorvastatin]     myalgia   Current Outpatient Medications on File Prior to Visit  Medication Sig Dispense Refill   aspirin 81 MG tablet Take 81 mg by mouth daily.     carbamazepine  (CARBATROL ) 300 MG 12 hr capsule Take  1 capsule  Daily  to Prevent  '  Absence Seizures ' 100 capsule 4   Cholecalciferol (VITAMIN D  PO) Take 10,000 Units by mouth daily.     fish oil-omega-3 fatty acids 1000 MG capsule Take 1 g by mouth daily.     losartan  (COZAAR ) 25 MG tablet Take 1 tablet (25 mg total) by mouth daily. 90 tablet 3   LUTEIN PO Take by mouth daily.     Magnesium 250 MG TABS Take 250 mg by mouth every other day.     metoprolol  succinate (TOPROL -XL) 100 MG 24 hr tablet Take 1 tablet by mouth Daily for BP & Heart 100 tablet 1   rosuvastatin  (CRESTOR ) 20 MG tablet TAKE 1 TABLET BY MOUTH DAILY FOR CHOLESTEROL 100 tablet 2   sildenafil  (VIAGRA ) 50 MG tablet Take 1 tablet (50 mg total) by mouth daily as needed for erectile dysfunction. (Patient taking differently: Take 100 mg by mouth daily as needed for erectile dysfunction.) 30 tablet 3   No current facility-administered medications on file prior to visit.    Review of Systems  Constitutional:  Negative for activity change, appetite change, fatigue, fever and unexpected weight change.  HENT:  Negative for congestion, rhinorrhea, sore throat and trouble swallowing.   Eyes:  Negative for pain, redness, itching and visual disturbance.  Respiratory:  Negative for cough, chest tightness, shortness of breath and wheezing.   Cardiovascular:  Negative for chest pain and palpitations.  Gastrointestinal:  Negative for abdominal pain, blood in stool, constipation, diarrhea and nausea.  Endocrine: Negative for cold intolerance, heat intolerance, polydipsia and polyuria.  Genitourinary:  Negative for difficulty urinating, dysuria, frequency and urgency.  Musculoskeletal:  Positive for arthralgias. Negative for joint swelling and myalgias.  Skin:  Negative  for pallor and rash.  Neurological:  Negative for dizziness, tremors, weakness, numbness and headaches.  Hematological:  Negative for adenopathy. Does not bruise/bleed easily.  Psychiatric/Behavioral:  Negative for decreased concentration and dysphoric mood. The patient is not nervous/anxious.        Objective:   Physical Exam Constitutional:      General: He is not in acute distress.    Appearance: Normal appearance. He is well-developed. He is obese. He is not ill-appearing or diaphoretic.  HENT:     Head: Normocephalic and atraumatic.     Right Ear: Tympanic membrane, ear canal and external ear normal.     Left Ear: Tympanic membrane, ear canal and external ear normal.     Nose: Nose normal. No congestion.     Mouth/Throat:     Mouth: Mucous membranes are moist.     Pharynx: Oropharynx is clear. No posterior oropharyngeal erythema.  Eyes:     General: No scleral icterus.       Right eye: No discharge.        Left eye: No discharge.     Conjunctiva/sclera: Conjunctivae normal.     Pupils: Pupils are equal, round, and reactive to light.  Neck:     Thyroid : No thyromegaly.     Vascular: No carotid bruit or JVD.  Cardiovascular:     Rate and Rhythm: Normal rate and regular rhythm.     Pulses: Normal pulses.     Heart sounds: Normal heart sounds.     No gallop.  Pulmonary:     Effort: Pulmonary effort is normal. No respiratory distress.     Breath sounds: Normal breath sounds. No wheezing or rales.     Comments: Good air exch Chest:     Chest wall: No tenderness.  Abdominal:     General: Bowel sounds are normal. There is no distension or abdominal bruit.     Palpations: Abdomen is soft. There is no mass.     Tenderness: There is no abdominal tenderness.     Hernia: No hernia is present.  Musculoskeletal:        General: No tenderness.     Cervical back: Normal range of motion and neck supple. No rigidity. No muscular tenderness.     Right lower leg: No edema.     Left  lower leg: No edema.  Lymphadenopathy:     Cervical: No cervical adenopathy.  Skin:    General: Skin is warm and dry.     Coloration: Skin is not pale.     Findings: No erythema or rash.     Comments: Solar lentigines diffusely  Scattered sks  Areas of previously treated AKs noted head/ears/arms  Neurological:     Mental Status: He is alert.     Cranial Nerves: No cranial nerve deficit.     Motor: No abnormal muscle tone.     Coordination: Coordination normal.     Gait: Gait normal.     Deep Tendon Reflexes: Reflexes are normal and symmetric. Reflexes normal.  Psychiatric:        Mood and Affect: Mood normal.        Cognition and Memory: Cognition and memory normal.           Assessment & Plan:   Problem List Items Addressed This Visit       Cardiovascular and Mediastinum   Essential hypertension   bp in fair control at this time  BP Readings from Last 1 Encounters:  04/22/24 133/62   No changes needed Most recent labs reviewed  Disc lifstyle change with low sodium diet and exercise  Some white coat component Under cardiology care/ CAD Continues  Metoprolol  xl 100 mg daily Losartan  25 mg daily         Relevant Orders   Lipid Panel   Comprehensive metabolic panel with GFR   TSH   CBC with Differential/Platelet     Nervous and Auditory   Seizure disorder (HCC)   No changes Reviewed last neuro note No new seizures Continues carbamazepine  300 mg daily        Genitourinary   BPH (benign prostatic hyperplasia)   No clinical changes Psa ordered       Relevant Orders   PSA, Medicare     Other   Vitamin D  deficiency   Last vitamin D  Lab Results  Component Value Date   VD25OH 64.8 02/04/2024   Vitamin D  level is therapeutic with current supplementation Disc importance of this to bone and overall health       Routine general medical examination at a health care facility - Primary   Reviewed health habits including diet and exercise and  skin cancer prevention Reviewed appropriate screening tests for age  Also reviewed health mt list, fam hx and immunization status , as well as social and family history   See HPI Labs reviewed and ordered Health Maintenance  Topic Date Due   Zoster (Shingles) Vaccine (1 of 2) 06/16/1967   COVID-19 Vaccine (10 - Pfizer risk 2025-26 season) 08/04/2024   Medicare Annual Wellness Visit  08/06/2024   Colon Cancer Screening  12/18/2025   DTaP/Tdap/Td vaccine (5 - Td or Tdap) 02/04/2034   Pneumococcal Vaccine for age over 56  Completed   Flu Shot  Completed  Hepatitis C Screening  Completed   Meningitis B Vaccine  Aged Out   Cologuard (Stool DNA test)  Discontinued    Utd imms Considering shingrix  Psa ordered  Discussed fall prevention, supplements and exercise for bone density  PHQ 0 Utd dermatology care/uses sun protection        Other abnormal glucose (prediabetes)   A1c ordered disc imp of low glycemic diet and wt loss to prevent DM2       Relevant Orders   Hemoglobin A1c   Obesity (BMI 30.0-34.9)   Discussed how this problem influences overall health and the risks it imposes  Reviewed plan for weight loss with lower calorie diet (via better food choices (lower glycemic and portion control) along with exercise building up to or more than 30 minutes 5 days per week including some aerobic activity and strength training         Hyperlipidemia   Disc goals for lipids and reasons to control them Rev last labs with pt Rev low sat fat diet in detail   Lab Results  Component Value Date   LDLCALC 71 06/08/2023   Crestor  20 mg daily  Omega 3 fish oil 1 g daily  Very active Has CAD / sees cardiology  Also recent ? Vein occlusion in right eye   Lab today       Relevant Orders   Lipid Panel   Comprehensive metabolic panel with GFR   History of colonic polyps   Colonoscopy normal 11/2022 Dr Rollin

## 2024-04-22 NOTE — Assessment & Plan Note (Signed)
 Colonoscopy normal 11/2022 Dr Elnoria Howard

## 2024-04-22 NOTE — Assessment & Plan Note (Signed)
 No clinical changes Psa ordered

## 2024-04-22 NOTE — Assessment & Plan Note (Signed)
 A1c ordered   disc imp of low glycemic diet and wt loss to prevent DM2

## 2024-04-22 NOTE — Assessment & Plan Note (Signed)
 Reviewed health habits including diet and exercise and skin cancer prevention Reviewed appropriate screening tests for age  Also reviewed health mt list, fam hx and immunization status , as well as social and family history   See HPI Labs reviewed and ordered Health Maintenance  Topic Date Due   Zoster (Shingles) Vaccine (1 of 2) 06/16/1967   COVID-19 Vaccine (10 - Pfizer risk 2025-26 season) 08/04/2024   Medicare Annual Wellness Visit  08/06/2024   Colon Cancer Screening  12/18/2025   DTaP/Tdap/Td vaccine (5 - Td or Tdap) 02/04/2034   Pneumococcal Vaccine for age over 57  Completed   Flu Shot  Completed   Hepatitis C Screening  Completed   Meningitis B Vaccine  Aged Out   Cologuard (Stool DNA test)  Discontinued    Utd imms Considering shingrix  Psa ordered  Discussed fall prevention, supplements and exercise for bone density  PHQ 0 Utd dermatology care/uses sun protection

## 2024-04-22 NOTE — Patient Instructions (Addendum)
 If you are interested in the new shingles vaccine (Shingrix) - call your local pharmacy to check on coverage and availability    Stay active Walking is great  Add some strength training to your routine, this is important for bone and brain health and can reduce your risk of falls and help your body use insulin  properly and regulate weight  Light weights, exercise bands , and internet videos are a good way to start  Yoga (chair or regular), machines , floor exercises or a gym with machines are also good options   Try to stick to a healthy diet Avoid added sugars in your diet when you can  Try to get most of your carbohydrates from produce (with the exception of white potatoes) and whole grains Eat less bread/pasta/rice/snack foods/cereals/sweets and other items from the middle of the grocery store (processed carbs)  Watch your blood pressure at home   Please schedule your annual medicare wellness (interview/ phone call)   Labs today

## 2024-04-22 NOTE — Assessment & Plan Note (Signed)
 bp in fair control at this time  BP Readings from Last 1 Encounters:  04/22/24 133/62   No changes needed Most recent labs reviewed  Disc lifstyle change with low sodium diet and exercise  Some white coat component Under cardiology care/ CAD Continues  Metoprolol  xl 100 mg daily Losartan  25 mg daily

## 2024-04-22 NOTE — Assessment & Plan Note (Signed)
 Last vitamin D  Lab Results  Component Value Date   VD25OH 64.8 02/04/2024   Vitamin D  level is therapeutic with current supplementation Disc importance of this to bone and overall health

## 2024-04-22 NOTE — Assessment & Plan Note (Signed)
 Discussed how this problem influences overall health and the risks it imposes  Reviewed plan for weight loss with lower calorie diet (via better food choices (lower glycemic and portion control) along with exercise building up to or more than 30 minutes 5 days per week including some aerobic activity and strength training

## 2024-04-22 NOTE — Assessment & Plan Note (Signed)
 Disc goals for lipids and reasons to control them Rev last labs with pt Rev low sat fat diet in detail   Lab Results  Component Value Date   LDLCALC 71 06/08/2023   Crestor  20 mg daily  Omega 3 fish oil 1 g daily  Very active Has CAD / sees cardiology  Also recent ? Vein occlusion in right eye   Lab today

## 2024-05-27 ENCOUNTER — Other Ambulatory Visit (INDEPENDENT_AMBULATORY_CARE_PROVIDER_SITE_OTHER)

## 2024-05-27 ENCOUNTER — Ambulatory Visit: Payer: Self-pay | Admitting: Family Medicine

## 2024-05-27 DIAGNOSIS — R7989 Other specified abnormal findings of blood chemistry: Secondary | ICD-10-CM | POA: Diagnosis not present

## 2024-05-27 DIAGNOSIS — R946 Abnormal results of thyroid function studies: Secondary | ICD-10-CM

## 2024-05-27 LAB — TSH: TSH: 5.38 u[IU]/mL (ref 0.35–5.50)

## 2024-05-27 LAB — T4, FREE: Free T4: 0.71 ng/dL (ref 0.60–1.60)

## 2024-06-09 ENCOUNTER — Ambulatory Visit: Payer: Medicare Other | Admitting: Nurse Practitioner

## 2024-08-01 ENCOUNTER — Ambulatory Visit

## 2024-08-05 ENCOUNTER — Ambulatory Visit

## 2025-02-09 ENCOUNTER — Ambulatory Visit: Admitting: Adult Health
